# Patient Record
Sex: Female | Born: 1940 | Race: White | Hispanic: No | Marital: Single | Smoking: Former smoker
Health system: Southern US, Community
[De-identification: ages and names within clinical notes are randomized; demographics above are authoritative.]

## PROBLEM LIST (undated history)

## (undated) DIAGNOSIS — Z8673 Personal history of transient ischemic attack (TIA), and cerebral infarction without residual deficits: Secondary | ICD-10-CM

## (undated) DIAGNOSIS — N952 Postmenopausal atrophic vaginitis: Secondary | ICD-10-CM

## (undated) DIAGNOSIS — G250 Essential tremor: Secondary | ICD-10-CM

## (undated) DIAGNOSIS — E785 Hyperlipidemia, unspecified: Secondary | ICD-10-CM

## (undated) DIAGNOSIS — N189 Chronic kidney disease, unspecified: Secondary | ICD-10-CM

## (undated) DIAGNOSIS — K224 Dyskinesia of esophagus: Secondary | ICD-10-CM

## (undated) DIAGNOSIS — Z86718 Personal history of other venous thrombosis and embolism: Secondary | ICD-10-CM

## (undated) DIAGNOSIS — G459 Transient cerebral ischemic attack, unspecified: Secondary | ICD-10-CM

## (undated) DIAGNOSIS — I82409 Acute embolism and thrombosis of unspecified deep veins of unspecified lower extremity: Secondary | ICD-10-CM

## (undated) DIAGNOSIS — I639 Cerebral infarction, unspecified: Secondary | ICD-10-CM

## (undated) HISTORY — PX: ELBOW SURGERY: SHX618

## (undated) HISTORY — DX: Postmenopausal atrophic vaginitis: N95.2

## (undated) HISTORY — PX: TRIGGER FINGER RELEASE: SHX641

## (undated) HISTORY — DX: Dyskinesia of esophagus: K22.4

## (undated) HISTORY — PX: CATARACT EXTRACTION: SUR2

## (undated) HISTORY — DX: Cerebral infarction, unspecified: I63.9

## (undated) HISTORY — DX: Acute embolism and thrombosis of unspecified deep veins of unspecified lower extremity: I82.409

## (undated) HISTORY — DX: Transient cerebral ischemic attack, unspecified: G45.9

## (undated) HISTORY — DX: Chronic kidney disease, unspecified: N18.9

## (undated) HISTORY — DX: Personal history of other venous thrombosis and embolism: Z86.718

## (undated) HISTORY — DX: Hyperlipidemia, unspecified: E78.5

## (undated) HISTORY — DX: Essential tremor: G25.0

---
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
## 2011-06-08 DIAGNOSIS — M999 Biomechanical lesion, unspecified: Secondary | ICD-10-CM | POA: Diagnosis not present

## 2011-06-08 DIAGNOSIS — S139XXA Sprain of joints and ligaments of unspecified parts of neck, initial encounter: Secondary | ICD-10-CM | POA: Diagnosis not present

## 2011-06-08 DIAGNOSIS — M9981 Other biomechanical lesions of cervical region: Secondary | ICD-10-CM | POA: Diagnosis not present

## 2011-06-08 DIAGNOSIS — M5126 Other intervertebral disc displacement, lumbar region: Secondary | ICD-10-CM | POA: Diagnosis not present

## 2011-08-03 DIAGNOSIS — M999 Biomechanical lesion, unspecified: Secondary | ICD-10-CM | POA: Diagnosis not present

## 2011-08-03 DIAGNOSIS — M9981 Other biomechanical lesions of cervical region: Secondary | ICD-10-CM | POA: Diagnosis not present

## 2011-08-03 DIAGNOSIS — S139XXA Sprain of joints and ligaments of unspecified parts of neck, initial encounter: Secondary | ICD-10-CM | POA: Diagnosis not present

## 2011-08-03 DIAGNOSIS — M5126 Other intervertebral disc displacement, lumbar region: Secondary | ICD-10-CM | POA: Diagnosis not present

## 2011-09-28 DIAGNOSIS — M9981 Other biomechanical lesions of cervical region: Secondary | ICD-10-CM | POA: Diagnosis not present

## 2011-09-28 DIAGNOSIS — M999 Biomechanical lesion, unspecified: Secondary | ICD-10-CM | POA: Diagnosis not present

## 2011-09-28 DIAGNOSIS — M5126 Other intervertebral disc displacement, lumbar region: Secondary | ICD-10-CM | POA: Diagnosis not present

## 2011-09-28 DIAGNOSIS — S139XXA Sprain of joints and ligaments of unspecified parts of neck, initial encounter: Secondary | ICD-10-CM | POA: Diagnosis not present

## 2011-11-02 DIAGNOSIS — S139XXA Sprain of joints and ligaments of unspecified parts of neck, initial encounter: Secondary | ICD-10-CM | POA: Diagnosis not present

## 2011-11-02 DIAGNOSIS — M5126 Other intervertebral disc displacement, lumbar region: Secondary | ICD-10-CM | POA: Diagnosis not present

## 2011-11-02 DIAGNOSIS — M9981 Other biomechanical lesions of cervical region: Secondary | ICD-10-CM | POA: Diagnosis not present

## 2011-11-02 DIAGNOSIS — M999 Biomechanical lesion, unspecified: Secondary | ICD-10-CM | POA: Diagnosis not present

## 2011-12-21 DIAGNOSIS — M9981 Other biomechanical lesions of cervical region: Secondary | ICD-10-CM | POA: Diagnosis not present

## 2011-12-21 DIAGNOSIS — M5126 Other intervertebral disc displacement, lumbar region: Secondary | ICD-10-CM | POA: Diagnosis not present

## 2011-12-21 DIAGNOSIS — S139XXA Sprain of joints and ligaments of unspecified parts of neck, initial encounter: Secondary | ICD-10-CM | POA: Diagnosis not present

## 2011-12-21 DIAGNOSIS — M999 Biomechanical lesion, unspecified: Secondary | ICD-10-CM | POA: Diagnosis not present

## 2012-02-15 DIAGNOSIS — M999 Biomechanical lesion, unspecified: Secondary | ICD-10-CM | POA: Diagnosis not present

## 2012-02-15 DIAGNOSIS — M9981 Other biomechanical lesions of cervical region: Secondary | ICD-10-CM | POA: Diagnosis not present

## 2012-02-15 DIAGNOSIS — M5126 Other intervertebral disc displacement, lumbar region: Secondary | ICD-10-CM | POA: Diagnosis not present

## 2012-02-15 DIAGNOSIS — S139XXA Sprain of joints and ligaments of unspecified parts of neck, initial encounter: Secondary | ICD-10-CM | POA: Diagnosis not present

## 2012-04-04 DIAGNOSIS — M999 Biomechanical lesion, unspecified: Secondary | ICD-10-CM | POA: Diagnosis not present

## 2012-04-04 DIAGNOSIS — M9981 Other biomechanical lesions of cervical region: Secondary | ICD-10-CM | POA: Diagnosis not present

## 2012-04-04 DIAGNOSIS — S139XXA Sprain of joints and ligaments of unspecified parts of neck, initial encounter: Secondary | ICD-10-CM | POA: Diagnosis not present

## 2012-04-04 DIAGNOSIS — M5126 Other intervertebral disc displacement, lumbar region: Secondary | ICD-10-CM | POA: Diagnosis not present

## 2012-06-06 DIAGNOSIS — S139XXA Sprain of joints and ligaments of unspecified parts of neck, initial encounter: Secondary | ICD-10-CM | POA: Diagnosis not present

## 2012-06-06 DIAGNOSIS — M9981 Other biomechanical lesions of cervical region: Secondary | ICD-10-CM | POA: Diagnosis not present

## 2012-06-06 DIAGNOSIS — M5126 Other intervertebral disc displacement, lumbar region: Secondary | ICD-10-CM | POA: Diagnosis not present

## 2012-06-06 DIAGNOSIS — M999 Biomechanical lesion, unspecified: Secondary | ICD-10-CM | POA: Diagnosis not present

## 2012-08-01 DIAGNOSIS — M999 Biomechanical lesion, unspecified: Secondary | ICD-10-CM | POA: Diagnosis not present

## 2012-08-01 DIAGNOSIS — S139XXA Sprain of joints and ligaments of unspecified parts of neck, initial encounter: Secondary | ICD-10-CM | POA: Diagnosis not present

## 2012-08-01 DIAGNOSIS — M9981 Other biomechanical lesions of cervical region: Secondary | ICD-10-CM | POA: Diagnosis not present

## 2012-08-01 DIAGNOSIS — M5126 Other intervertebral disc displacement, lumbar region: Secondary | ICD-10-CM | POA: Diagnosis not present

## 2012-09-26 DIAGNOSIS — M5126 Other intervertebral disc displacement, lumbar region: Secondary | ICD-10-CM | POA: Diagnosis not present

## 2012-09-26 DIAGNOSIS — S139XXA Sprain of joints and ligaments of unspecified parts of neck, initial encounter: Secondary | ICD-10-CM | POA: Diagnosis not present

## 2012-09-26 DIAGNOSIS — M999 Biomechanical lesion, unspecified: Secondary | ICD-10-CM | POA: Diagnosis not present

## 2012-09-26 DIAGNOSIS — M9981 Other biomechanical lesions of cervical region: Secondary | ICD-10-CM | POA: Diagnosis not present

## 2012-10-22 DIAGNOSIS — M9981 Other biomechanical lesions of cervical region: Secondary | ICD-10-CM | POA: Diagnosis not present

## 2012-10-22 DIAGNOSIS — M999 Biomechanical lesion, unspecified: Secondary | ICD-10-CM | POA: Diagnosis not present

## 2012-10-22 DIAGNOSIS — S139XXA Sprain of joints and ligaments of unspecified parts of neck, initial encounter: Secondary | ICD-10-CM | POA: Diagnosis not present

## 2012-10-22 DIAGNOSIS — M5126 Other intervertebral disc displacement, lumbar region: Secondary | ICD-10-CM | POA: Diagnosis not present

## 2012-10-31 DIAGNOSIS — M999 Biomechanical lesion, unspecified: Secondary | ICD-10-CM | POA: Diagnosis not present

## 2012-10-31 DIAGNOSIS — S139XXA Sprain of joints and ligaments of unspecified parts of neck, initial encounter: Secondary | ICD-10-CM | POA: Diagnosis not present

## 2012-10-31 DIAGNOSIS — M5126 Other intervertebral disc displacement, lumbar region: Secondary | ICD-10-CM | POA: Diagnosis not present

## 2012-10-31 DIAGNOSIS — M9981 Other biomechanical lesions of cervical region: Secondary | ICD-10-CM | POA: Diagnosis not present

## 2012-11-05 DIAGNOSIS — M9981 Other biomechanical lesions of cervical region: Secondary | ICD-10-CM | POA: Diagnosis not present

## 2012-11-05 DIAGNOSIS — M5126 Other intervertebral disc displacement, lumbar region: Secondary | ICD-10-CM | POA: Diagnosis not present

## 2012-11-05 DIAGNOSIS — S139XXA Sprain of joints and ligaments of unspecified parts of neck, initial encounter: Secondary | ICD-10-CM | POA: Diagnosis not present

## 2012-11-05 DIAGNOSIS — M999 Biomechanical lesion, unspecified: Secondary | ICD-10-CM | POA: Diagnosis not present

## 2012-12-19 DIAGNOSIS — M9981 Other biomechanical lesions of cervical region: Secondary | ICD-10-CM | POA: Diagnosis not present

## 2012-12-19 DIAGNOSIS — M999 Biomechanical lesion, unspecified: Secondary | ICD-10-CM | POA: Diagnosis not present

## 2012-12-19 DIAGNOSIS — S139XXA Sprain of joints and ligaments of unspecified parts of neck, initial encounter: Secondary | ICD-10-CM | POA: Diagnosis not present

## 2012-12-19 DIAGNOSIS — M5126 Other intervertebral disc displacement, lumbar region: Secondary | ICD-10-CM | POA: Diagnosis not present

## 2013-02-20 DIAGNOSIS — M9981 Other biomechanical lesions of cervical region: Secondary | ICD-10-CM | POA: Diagnosis not present

## 2013-02-20 DIAGNOSIS — M5126 Other intervertebral disc displacement, lumbar region: Secondary | ICD-10-CM | POA: Diagnosis not present

## 2013-02-20 DIAGNOSIS — M999 Biomechanical lesion, unspecified: Secondary | ICD-10-CM | POA: Diagnosis not present

## 2013-02-20 DIAGNOSIS — S139XXA Sprain of joints and ligaments of unspecified parts of neck, initial encounter: Secondary | ICD-10-CM | POA: Diagnosis not present

## 2013-03-20 DIAGNOSIS — M9981 Other biomechanical lesions of cervical region: Secondary | ICD-10-CM | POA: Diagnosis not present

## 2013-03-20 DIAGNOSIS — M999 Biomechanical lesion, unspecified: Secondary | ICD-10-CM | POA: Diagnosis not present

## 2013-03-20 DIAGNOSIS — S139XXA Sprain of joints and ligaments of unspecified parts of neck, initial encounter: Secondary | ICD-10-CM | POA: Diagnosis not present

## 2013-03-20 DIAGNOSIS — M5126 Other intervertebral disc displacement, lumbar region: Secondary | ICD-10-CM | POA: Diagnosis not present

## 2013-06-20 DIAGNOSIS — Z7901 Long term (current) use of anticoagulants: Secondary | ICD-10-CM | POA: Diagnosis not present

## 2013-07-10 DIAGNOSIS — M9981 Other biomechanical lesions of cervical region: Secondary | ICD-10-CM | POA: Diagnosis not present

## 2013-07-10 DIAGNOSIS — S139XXA Sprain of joints and ligaments of unspecified parts of neck, initial encounter: Secondary | ICD-10-CM | POA: Diagnosis not present

## 2013-07-10 DIAGNOSIS — M999 Biomechanical lesion, unspecified: Secondary | ICD-10-CM | POA: Diagnosis not present

## 2013-07-10 DIAGNOSIS — M5126 Other intervertebral disc displacement, lumbar region: Secondary | ICD-10-CM | POA: Diagnosis not present

## 2013-07-25 DIAGNOSIS — Z7901 Long term (current) use of anticoagulants: Secondary | ICD-10-CM | POA: Diagnosis not present

## 2013-08-21 DIAGNOSIS — G250 Essential tremor: Secondary | ICD-10-CM | POA: Diagnosis not present

## 2013-08-21 DIAGNOSIS — E782 Mixed hyperlipidemia: Secondary | ICD-10-CM | POA: Diagnosis not present

## 2013-08-21 DIAGNOSIS — Z780 Asymptomatic menopausal state: Secondary | ICD-10-CM | POA: Diagnosis not present

## 2013-08-21 DIAGNOSIS — Z0000 Encounter for general adult medical examination without abnormal findings: Secondary | ICD-10-CM | POA: Diagnosis not present

## 2013-08-21 DIAGNOSIS — M810 Age-related osteoporosis without current pathological fracture: Secondary | ICD-10-CM | POA: Diagnosis not present

## 2013-08-21 DIAGNOSIS — Z79899 Other long term (current) drug therapy: Secondary | ICD-10-CM | POA: Diagnosis not present

## 2013-08-21 DIAGNOSIS — G252 Other specified forms of tremor: Secondary | ICD-10-CM | POA: Diagnosis not present

## 2013-08-21 DIAGNOSIS — I10 Essential (primary) hypertension: Secondary | ICD-10-CM | POA: Diagnosis not present

## 2013-08-21 DIAGNOSIS — Z86718 Personal history of other venous thrombosis and embolism: Secondary | ICD-10-CM | POA: Diagnosis not present

## 2013-08-21 DIAGNOSIS — Z7901 Long term (current) use of anticoagulants: Secondary | ICD-10-CM | POA: Diagnosis not present

## 2013-08-21 DIAGNOSIS — Z23 Encounter for immunization: Secondary | ICD-10-CM | POA: Diagnosis not present

## 2013-08-22 DIAGNOSIS — E782 Mixed hyperlipidemia: Secondary | ICD-10-CM | POA: Diagnosis not present

## 2013-08-22 DIAGNOSIS — I2510 Atherosclerotic heart disease of native coronary artery without angina pectoris: Secondary | ICD-10-CM | POA: Diagnosis not present

## 2013-09-04 DIAGNOSIS — M9981 Other biomechanical lesions of cervical region: Secondary | ICD-10-CM | POA: Diagnosis not present

## 2013-09-04 DIAGNOSIS — M999 Biomechanical lesion, unspecified: Secondary | ICD-10-CM | POA: Diagnosis not present

## 2013-09-04 DIAGNOSIS — M5126 Other intervertebral disc displacement, lumbar region: Secondary | ICD-10-CM | POA: Diagnosis not present

## 2013-09-04 DIAGNOSIS — S139XXA Sprain of joints and ligaments of unspecified parts of neck, initial encounter: Secondary | ICD-10-CM | POA: Diagnosis not present

## 2013-09-11 DIAGNOSIS — H26499 Other secondary cataract, unspecified eye: Secondary | ICD-10-CM | POA: Diagnosis not present

## 2013-09-19 DIAGNOSIS — Z23 Encounter for immunization: Secondary | ICD-10-CM | POA: Diagnosis not present

## 2013-09-19 DIAGNOSIS — Z7901 Long term (current) use of anticoagulants: Secondary | ICD-10-CM | POA: Diagnosis not present

## 2013-10-07 DIAGNOSIS — M5126 Other intervertebral disc displacement, lumbar region: Secondary | ICD-10-CM | POA: Diagnosis not present

## 2013-10-07 DIAGNOSIS — M999 Biomechanical lesion, unspecified: Secondary | ICD-10-CM | POA: Diagnosis not present

## 2013-10-07 DIAGNOSIS — M9981 Other biomechanical lesions of cervical region: Secondary | ICD-10-CM | POA: Diagnosis not present

## 2013-10-07 DIAGNOSIS — S139XXA Sprain of joints and ligaments of unspecified parts of neck, initial encounter: Secondary | ICD-10-CM | POA: Diagnosis not present

## 2013-10-20 DIAGNOSIS — Z7901 Long term (current) use of anticoagulants: Secondary | ICD-10-CM | POA: Diagnosis not present

## 2013-11-21 DIAGNOSIS — Z7901 Long term (current) use of anticoagulants: Secondary | ICD-10-CM | POA: Diagnosis not present

## 2013-11-28 DIAGNOSIS — M7710 Lateral epicondylitis, unspecified elbow: Secondary | ICD-10-CM | POA: Diagnosis not present

## 2013-11-28 DIAGNOSIS — Z7901 Long term (current) use of anticoagulants: Secondary | ICD-10-CM | POA: Diagnosis not present

## 2013-12-04 DIAGNOSIS — M999 Biomechanical lesion, unspecified: Secondary | ICD-10-CM | POA: Diagnosis not present

## 2013-12-04 DIAGNOSIS — M9981 Other biomechanical lesions of cervical region: Secondary | ICD-10-CM | POA: Diagnosis not present

## 2013-12-04 DIAGNOSIS — S139XXA Sprain of joints and ligaments of unspecified parts of neck, initial encounter: Secondary | ICD-10-CM | POA: Diagnosis not present

## 2013-12-04 DIAGNOSIS — M5126 Other intervertebral disc displacement, lumbar region: Secondary | ICD-10-CM | POA: Diagnosis not present

## 2013-12-17 DIAGNOSIS — M7710 Lateral epicondylitis, unspecified elbow: Secondary | ICD-10-CM | POA: Diagnosis not present

## 2013-12-17 DIAGNOSIS — Z7901 Long term (current) use of anticoagulants: Secondary | ICD-10-CM | POA: Diagnosis not present

## 2014-01-15 DIAGNOSIS — M7711 Lateral epicondylitis, right elbow: Secondary | ICD-10-CM | POA: Diagnosis not present

## 2014-01-15 DIAGNOSIS — Z7901 Long term (current) use of anticoagulants: Secondary | ICD-10-CM | POA: Diagnosis not present

## 2014-01-22 DIAGNOSIS — M5441 Lumbago with sciatica, right side: Secondary | ICD-10-CM | POA: Diagnosis not present

## 2014-01-22 DIAGNOSIS — M9903 Segmental and somatic dysfunction of lumbar region: Secondary | ICD-10-CM | POA: Diagnosis not present

## 2014-01-22 DIAGNOSIS — M9905 Segmental and somatic dysfunction of pelvic region: Secondary | ICD-10-CM | POA: Diagnosis not present

## 2014-01-22 DIAGNOSIS — M9901 Segmental and somatic dysfunction of cervical region: Secondary | ICD-10-CM | POA: Diagnosis not present

## 2014-01-29 DIAGNOSIS — N950 Postmenopausal bleeding: Secondary | ICD-10-CM | POA: Diagnosis not present

## 2014-02-20 DIAGNOSIS — Z7901 Long term (current) use of anticoagulants: Secondary | ICD-10-CM | POA: Diagnosis not present

## 2014-03-19 DIAGNOSIS — M9905 Segmental and somatic dysfunction of pelvic region: Secondary | ICD-10-CM | POA: Diagnosis not present

## 2014-03-19 DIAGNOSIS — M5441 Lumbago with sciatica, right side: Secondary | ICD-10-CM | POA: Diagnosis not present

## 2014-03-19 DIAGNOSIS — M9901 Segmental and somatic dysfunction of cervical region: Secondary | ICD-10-CM | POA: Diagnosis not present

## 2014-03-19 DIAGNOSIS — M9903 Segmental and somatic dysfunction of lumbar region: Secondary | ICD-10-CM | POA: Diagnosis not present

## 2014-03-20 DIAGNOSIS — Z7901 Long term (current) use of anticoagulants: Secondary | ICD-10-CM | POA: Diagnosis not present

## 2014-04-14 DIAGNOSIS — M9905 Segmental and somatic dysfunction of pelvic region: Secondary | ICD-10-CM | POA: Diagnosis not present

## 2014-04-14 DIAGNOSIS — M9903 Segmental and somatic dysfunction of lumbar region: Secondary | ICD-10-CM | POA: Diagnosis not present

## 2014-04-14 DIAGNOSIS — M5441 Lumbago with sciatica, right side: Secondary | ICD-10-CM | POA: Diagnosis not present

## 2014-04-14 DIAGNOSIS — M9901 Segmental and somatic dysfunction of cervical region: Secondary | ICD-10-CM | POA: Diagnosis not present

## 2014-04-15 DIAGNOSIS — M7711 Lateral epicondylitis, right elbow: Secondary | ICD-10-CM | POA: Diagnosis not present

## 2014-04-15 DIAGNOSIS — Z7901 Long term (current) use of anticoagulants: Secondary | ICD-10-CM | POA: Diagnosis not present

## 2014-04-15 DIAGNOSIS — N950 Postmenopausal bleeding: Secondary | ICD-10-CM | POA: Diagnosis not present

## 2014-04-27 DIAGNOSIS — M25521 Pain in right elbow: Secondary | ICD-10-CM | POA: Diagnosis not present

## 2014-04-30 DIAGNOSIS — M25521 Pain in right elbow: Secondary | ICD-10-CM | POA: Diagnosis not present

## 2014-05-04 DIAGNOSIS — M25521 Pain in right elbow: Secondary | ICD-10-CM | POA: Diagnosis not present

## 2014-05-07 DIAGNOSIS — M25521 Pain in right elbow: Secondary | ICD-10-CM | POA: Diagnosis not present

## 2014-05-11 DIAGNOSIS — M25521 Pain in right elbow: Secondary | ICD-10-CM | POA: Diagnosis not present

## 2014-05-14 DIAGNOSIS — M25521 Pain in right elbow: Secondary | ICD-10-CM | POA: Diagnosis not present

## 2014-05-20 DIAGNOSIS — M25521 Pain in right elbow: Secondary | ICD-10-CM | POA: Diagnosis not present

## 2014-05-22 DIAGNOSIS — Z7901 Long term (current) use of anticoagulants: Secondary | ICD-10-CM | POA: Diagnosis not present

## 2014-05-28 DIAGNOSIS — M25521 Pain in right elbow: Secondary | ICD-10-CM | POA: Diagnosis not present

## 2014-06-03 DIAGNOSIS — M25521 Pain in right elbow: Secondary | ICD-10-CM | POA: Diagnosis not present

## 2014-06-09 DIAGNOSIS — M9905 Segmental and somatic dysfunction of pelvic region: Secondary | ICD-10-CM | POA: Diagnosis not present

## 2014-06-09 DIAGNOSIS — M9903 Segmental and somatic dysfunction of lumbar region: Secondary | ICD-10-CM | POA: Diagnosis not present

## 2014-06-09 DIAGNOSIS — M5441 Lumbago with sciatica, right side: Secondary | ICD-10-CM | POA: Diagnosis not present

## 2014-06-09 DIAGNOSIS — M9901 Segmental and somatic dysfunction of cervical region: Secondary | ICD-10-CM | POA: Diagnosis not present

## 2014-06-10 DIAGNOSIS — M25521 Pain in right elbow: Secondary | ICD-10-CM | POA: Diagnosis not present

## 2014-06-18 DIAGNOSIS — M25521 Pain in right elbow: Secondary | ICD-10-CM | POA: Diagnosis not present

## 2014-08-06 DIAGNOSIS — M5441 Lumbago with sciatica, right side: Secondary | ICD-10-CM | POA: Diagnosis not present

## 2014-08-06 DIAGNOSIS — M9901 Segmental and somatic dysfunction of cervical region: Secondary | ICD-10-CM | POA: Diagnosis not present

## 2014-08-06 DIAGNOSIS — M9903 Segmental and somatic dysfunction of lumbar region: Secondary | ICD-10-CM | POA: Diagnosis not present

## 2014-08-06 DIAGNOSIS — M9905 Segmental and somatic dysfunction of pelvic region: Secondary | ICD-10-CM | POA: Diagnosis not present

## 2014-08-13 DIAGNOSIS — M9903 Segmental and somatic dysfunction of lumbar region: Secondary | ICD-10-CM | POA: Diagnosis not present

## 2014-08-13 DIAGNOSIS — M5441 Lumbago with sciatica, right side: Secondary | ICD-10-CM | POA: Diagnosis not present

## 2014-08-13 DIAGNOSIS — M9901 Segmental and somatic dysfunction of cervical region: Secondary | ICD-10-CM | POA: Diagnosis not present

## 2014-08-13 DIAGNOSIS — M9905 Segmental and somatic dysfunction of pelvic region: Secondary | ICD-10-CM | POA: Diagnosis not present

## 2014-09-15 DIAGNOSIS — M9901 Segmental and somatic dysfunction of cervical region: Secondary | ICD-10-CM | POA: Diagnosis not present

## 2014-09-15 DIAGNOSIS — M5441 Lumbago with sciatica, right side: Secondary | ICD-10-CM | POA: Diagnosis not present

## 2014-09-15 DIAGNOSIS — M9905 Segmental and somatic dysfunction of pelvic region: Secondary | ICD-10-CM | POA: Diagnosis not present

## 2014-09-15 DIAGNOSIS — M9903 Segmental and somatic dysfunction of lumbar region: Secondary | ICD-10-CM | POA: Diagnosis not present

## 2014-09-17 DIAGNOSIS — H26493 Other secondary cataract, bilateral: Secondary | ICD-10-CM | POA: Diagnosis not present

## 2014-09-24 DIAGNOSIS — M5441 Lumbago with sciatica, right side: Secondary | ICD-10-CM | POA: Diagnosis not present

## 2014-09-24 DIAGNOSIS — M9905 Segmental and somatic dysfunction of pelvic region: Secondary | ICD-10-CM | POA: Diagnosis not present

## 2014-09-24 DIAGNOSIS — M9901 Segmental and somatic dysfunction of cervical region: Secondary | ICD-10-CM | POA: Diagnosis not present

## 2014-09-24 DIAGNOSIS — M9903 Segmental and somatic dysfunction of lumbar region: Secondary | ICD-10-CM | POA: Diagnosis not present

## 2014-10-06 DIAGNOSIS — M9901 Segmental and somatic dysfunction of cervical region: Secondary | ICD-10-CM | POA: Diagnosis not present

## 2014-10-06 DIAGNOSIS — M5441 Lumbago with sciatica, right side: Secondary | ICD-10-CM | POA: Diagnosis not present

## 2014-10-06 DIAGNOSIS — M9905 Segmental and somatic dysfunction of pelvic region: Secondary | ICD-10-CM | POA: Diagnosis not present

## 2014-10-06 DIAGNOSIS — M9903 Segmental and somatic dysfunction of lumbar region: Secondary | ICD-10-CM | POA: Diagnosis not present

## 2014-11-04 DIAGNOSIS — H1033 Unspecified acute conjunctivitis, bilateral: Secondary | ICD-10-CM | POA: Diagnosis not present

## 2014-11-04 DIAGNOSIS — J0100 Acute maxillary sinusitis, unspecified: Secondary | ICD-10-CM | POA: Diagnosis not present

## 2014-11-04 DIAGNOSIS — J209 Acute bronchitis, unspecified: Secondary | ICD-10-CM | POA: Diagnosis not present

## 2014-11-10 DIAGNOSIS — J209 Acute bronchitis, unspecified: Secondary | ICD-10-CM | POA: Diagnosis not present

## 2014-11-13 DIAGNOSIS — K449 Diaphragmatic hernia without obstruction or gangrene: Secondary | ICD-10-CM | POA: Diagnosis not present

## 2014-11-13 DIAGNOSIS — R748 Abnormal levels of other serum enzymes: Secondary | ICD-10-CM | POA: Diagnosis not present

## 2014-11-13 DIAGNOSIS — J984 Other disorders of lung: Secondary | ICD-10-CM | POA: Diagnosis not present

## 2014-11-13 DIAGNOSIS — R42 Dizziness and giddiness: Secondary | ICD-10-CM | POA: Diagnosis not present

## 2014-11-13 DIAGNOSIS — H539 Unspecified visual disturbance: Secondary | ICD-10-CM | POA: Diagnosis not present

## 2014-11-13 DIAGNOSIS — H7092 Unspecified mastoiditis, left ear: Secondary | ICD-10-CM | POA: Diagnosis not present

## 2014-11-13 DIAGNOSIS — I639 Cerebral infarction, unspecified: Secondary | ICD-10-CM | POA: Diagnosis not present

## 2014-11-13 DIAGNOSIS — J40 Bronchitis, not specified as acute or chronic: Secondary | ICD-10-CM | POA: Diagnosis not present

## 2014-11-14 DIAGNOSIS — N289 Disorder of kidney and ureter, unspecified: Secondary | ICD-10-CM | POA: Diagnosis present

## 2014-11-14 DIAGNOSIS — Z8673 Personal history of transient ischemic attack (TIA), and cerebral infarction without residual deficits: Secondary | ICD-10-CM | POA: Insufficient documentation

## 2014-11-14 DIAGNOSIS — I6340 Cerebral infarction due to embolism of unspecified cerebral artery: Secondary | ICD-10-CM | POA: Diagnosis present

## 2014-11-14 DIAGNOSIS — G464 Cerebellar stroke syndrome: Secondary | ICD-10-CM | POA: Diagnosis not present

## 2014-11-14 DIAGNOSIS — H6692 Otitis media, unspecified, left ear: Secondary | ICD-10-CM | POA: Diagnosis present

## 2014-11-14 DIAGNOSIS — R42 Dizziness and giddiness: Secondary | ICD-10-CM | POA: Diagnosis present

## 2014-11-14 DIAGNOSIS — I10 Essential (primary) hypertension: Secondary | ICD-10-CM | POA: Diagnosis present

## 2014-11-14 DIAGNOSIS — Z87891 Personal history of nicotine dependence: Secondary | ICD-10-CM | POA: Diagnosis not present

## 2014-11-14 DIAGNOSIS — I6522 Occlusion and stenosis of left carotid artery: Secondary | ICD-10-CM | POA: Diagnosis not present

## 2014-11-14 DIAGNOSIS — E785 Hyperlipidemia, unspecified: Secondary | ICD-10-CM | POA: Diagnosis not present

## 2014-11-14 DIAGNOSIS — R748 Abnormal levels of other serum enzymes: Secondary | ICD-10-CM | POA: Diagnosis not present

## 2014-11-14 DIAGNOSIS — G250 Essential tremor: Secondary | ICD-10-CM | POA: Diagnosis present

## 2014-11-14 DIAGNOSIS — Z86718 Personal history of other venous thrombosis and embolism: Secondary | ICD-10-CM | POA: Diagnosis not present

## 2014-11-14 DIAGNOSIS — Z7901 Long term (current) use of anticoagulants: Secondary | ICD-10-CM | POA: Diagnosis not present

## 2014-11-14 DIAGNOSIS — I639 Cerebral infarction, unspecified: Secondary | ICD-10-CM | POA: Diagnosis not present

## 2014-11-14 DIAGNOSIS — R269 Unspecified abnormalities of gait and mobility: Secondary | ICD-10-CM | POA: Diagnosis present

## 2014-11-14 DIAGNOSIS — H539 Unspecified visual disturbance: Secondary | ICD-10-CM | POA: Diagnosis not present

## 2014-11-14 DIAGNOSIS — I340 Nonrheumatic mitral (valve) insufficiency: Secondary | ICD-10-CM | POA: Diagnosis not present

## 2014-11-14 DIAGNOSIS — M47812 Spondylosis without myelopathy or radiculopathy, cervical region: Secondary | ICD-10-CM | POA: Diagnosis not present

## 2014-11-14 HISTORY — DX: Personal history of transient ischemic attack (TIA), and cerebral infarction without residual deficits: Z86.73

## 2014-11-19 DIAGNOSIS — I639 Cerebral infarction, unspecified: Secondary | ICD-10-CM | POA: Diagnosis not present

## 2014-11-19 DIAGNOSIS — E782 Mixed hyperlipidemia: Secondary | ICD-10-CM | POA: Diagnosis not present

## 2014-11-19 DIAGNOSIS — I10 Essential (primary) hypertension: Secondary | ICD-10-CM | POA: Diagnosis not present

## 2014-11-30 DIAGNOSIS — R2689 Other abnormalities of gait and mobility: Secondary | ICD-10-CM | POA: Diagnosis not present

## 2014-12-07 DIAGNOSIS — R2689 Other abnormalities of gait and mobility: Secondary | ICD-10-CM | POA: Diagnosis not present

## 2014-12-10 DIAGNOSIS — R2689 Other abnormalities of gait and mobility: Secondary | ICD-10-CM | POA: Diagnosis not present

## 2014-12-15 DIAGNOSIS — H53453 Other localized visual field defect, bilateral: Secondary | ICD-10-CM | POA: Diagnosis not present

## 2014-12-17 DIAGNOSIS — R2689 Other abnormalities of gait and mobility: Secondary | ICD-10-CM | POA: Diagnosis not present

## 2014-12-23 DIAGNOSIS — R2689 Other abnormalities of gait and mobility: Secondary | ICD-10-CM | POA: Diagnosis not present

## 2014-12-25 DIAGNOSIS — R2689 Other abnormalities of gait and mobility: Secondary | ICD-10-CM | POA: Diagnosis not present

## 2014-12-25 DIAGNOSIS — G459 Transient cerebral ischemic attack, unspecified: Secondary | ICD-10-CM | POA: Diagnosis not present

## 2014-12-25 DIAGNOSIS — R4781 Slurred speech: Secondary | ICD-10-CM | POA: Diagnosis not present

## 2014-12-25 DIAGNOSIS — R200 Anesthesia of skin: Secondary | ICD-10-CM | POA: Diagnosis not present

## 2014-12-25 DIAGNOSIS — R4701 Aphasia: Secondary | ICD-10-CM | POA: Diagnosis not present

## 2014-12-30 DIAGNOSIS — G459 Transient cerebral ischemic attack, unspecified: Secondary | ICD-10-CM | POA: Diagnosis not present

## 2014-12-30 DIAGNOSIS — Z23 Encounter for immunization: Secondary | ICD-10-CM | POA: Diagnosis not present

## 2014-12-30 DIAGNOSIS — G250 Essential tremor: Secondary | ICD-10-CM | POA: Diagnosis not present

## 2014-12-30 DIAGNOSIS — E782 Mixed hyperlipidemia: Secondary | ICD-10-CM | POA: Diagnosis not present

## 2014-12-30 DIAGNOSIS — I10 Essential (primary) hypertension: Secondary | ICD-10-CM | POA: Diagnosis not present

## 2015-01-07 DIAGNOSIS — M5441 Lumbago with sciatica, right side: Secondary | ICD-10-CM | POA: Diagnosis not present

## 2015-01-07 DIAGNOSIS — M9903 Segmental and somatic dysfunction of lumbar region: Secondary | ICD-10-CM | POA: Diagnosis not present

## 2015-01-07 DIAGNOSIS — R2689 Other abnormalities of gait and mobility: Secondary | ICD-10-CM | POA: Diagnosis not present

## 2015-01-07 DIAGNOSIS — M9901 Segmental and somatic dysfunction of cervical region: Secondary | ICD-10-CM | POA: Diagnosis not present

## 2015-01-07 DIAGNOSIS — M9905 Segmental and somatic dysfunction of pelvic region: Secondary | ICD-10-CM | POA: Diagnosis not present

## 2015-01-11 DIAGNOSIS — Z7901 Long term (current) use of anticoagulants: Secondary | ICD-10-CM | POA: Diagnosis not present

## 2015-01-15 DIAGNOSIS — R2689 Other abnormalities of gait and mobility: Secondary | ICD-10-CM | POA: Diagnosis not present

## 2015-01-22 DIAGNOSIS — Z7901 Long term (current) use of anticoagulants: Secondary | ICD-10-CM | POA: Diagnosis not present

## 2015-01-29 DIAGNOSIS — Z7901 Long term (current) use of anticoagulants: Secondary | ICD-10-CM | POA: Diagnosis not present

## 2015-02-05 DIAGNOSIS — R2689 Other abnormalities of gait and mobility: Secondary | ICD-10-CM | POA: Diagnosis not present

## 2015-02-05 DIAGNOSIS — Z7901 Long term (current) use of anticoagulants: Secondary | ICD-10-CM | POA: Diagnosis not present

## 2015-02-12 DIAGNOSIS — Z7901 Long term (current) use of anticoagulants: Secondary | ICD-10-CM | POA: Diagnosis not present

## 2015-03-15 DIAGNOSIS — Z7901 Long term (current) use of anticoagulants: Secondary | ICD-10-CM | POA: Diagnosis not present

## 2015-03-25 DIAGNOSIS — H53001 Unspecified amblyopia, right eye: Secondary | ICD-10-CM | POA: Diagnosis not present

## 2015-03-25 DIAGNOSIS — H26493 Other secondary cataract, bilateral: Secondary | ICD-10-CM | POA: Diagnosis not present

## 2015-03-25 DIAGNOSIS — H53469 Homonymous bilateral field defects, unspecified side: Secondary | ICD-10-CM | POA: Diagnosis not present

## 2015-03-26 DIAGNOSIS — R2689 Other abnormalities of gait and mobility: Secondary | ICD-10-CM | POA: Diagnosis not present

## 2015-04-14 DIAGNOSIS — Z7901 Long term (current) use of anticoagulants: Secondary | ICD-10-CM | POA: Diagnosis not present

## 2015-04-23 DIAGNOSIS — R2689 Other abnormalities of gait and mobility: Secondary | ICD-10-CM | POA: Diagnosis not present

## 2015-05-14 DIAGNOSIS — I10 Essential (primary) hypertension: Secondary | ICD-10-CM | POA: Diagnosis not present

## 2015-05-14 DIAGNOSIS — Z0000 Encounter for general adult medical examination without abnormal findings: Secondary | ICD-10-CM | POA: Diagnosis not present

## 2015-05-14 DIAGNOSIS — Z7901 Long term (current) use of anticoagulants: Secondary | ICD-10-CM | POA: Diagnosis not present

## 2015-05-14 DIAGNOSIS — N952 Postmenopausal atrophic vaginitis: Secondary | ICD-10-CM | POA: Diagnosis not present

## 2015-05-14 DIAGNOSIS — E782 Mixed hyperlipidemia: Secondary | ICD-10-CM | POA: Diagnosis not present

## 2015-05-14 DIAGNOSIS — L570 Actinic keratosis: Secondary | ICD-10-CM | POA: Diagnosis not present

## 2015-05-21 DIAGNOSIS — H532 Diplopia: Secondary | ICD-10-CM | POA: Diagnosis not present

## 2015-06-10 DIAGNOSIS — H26493 Other secondary cataract, bilateral: Secondary | ICD-10-CM | POA: Diagnosis not present

## 2015-06-15 DIAGNOSIS — Z7901 Long term (current) use of anticoagulants: Secondary | ICD-10-CM | POA: Diagnosis not present

## 2015-06-17 DIAGNOSIS — H26491 Other secondary cataract, right eye: Secondary | ICD-10-CM | POA: Diagnosis not present

## 2015-07-15 DIAGNOSIS — Z7901 Long term (current) use of anticoagulants: Secondary | ICD-10-CM | POA: Diagnosis not present

## 2015-08-12 DIAGNOSIS — Z7901 Long term (current) use of anticoagulants: Secondary | ICD-10-CM | POA: Diagnosis not present

## 2015-09-24 DIAGNOSIS — I10 Essential (primary) hypertension: Secondary | ICD-10-CM | POA: Diagnosis not present

## 2015-09-24 DIAGNOSIS — E782 Mixed hyperlipidemia: Secondary | ICD-10-CM | POA: Diagnosis not present

## 2015-09-24 DIAGNOSIS — Z7901 Long term (current) use of anticoagulants: Secondary | ICD-10-CM | POA: Diagnosis not present

## 2015-09-24 DIAGNOSIS — Z79899 Other long term (current) drug therapy: Secondary | ICD-10-CM | POA: Diagnosis not present

## 2015-10-26 DIAGNOSIS — Z7901 Long term (current) use of anticoagulants: Secondary | ICD-10-CM | POA: Diagnosis not present

## 2015-11-09 DIAGNOSIS — H1033 Unspecified acute conjunctivitis, bilateral: Secondary | ICD-10-CM | POA: Diagnosis not present

## 2015-11-09 DIAGNOSIS — J069 Acute upper respiratory infection, unspecified: Secondary | ICD-10-CM | POA: Diagnosis not present

## 2015-11-19 DIAGNOSIS — Z7901 Long term (current) use of anticoagulants: Secondary | ICD-10-CM | POA: Diagnosis not present

## 2015-12-21 DIAGNOSIS — Z7901 Long term (current) use of anticoagulants: Secondary | ICD-10-CM | POA: Diagnosis not present

## 2016-01-03 DIAGNOSIS — H53002 Unspecified amblyopia, left eye: Secondary | ICD-10-CM | POA: Diagnosis not present

## 2016-01-03 DIAGNOSIS — H26493 Other secondary cataract, bilateral: Secondary | ICD-10-CM | POA: Diagnosis not present

## 2016-01-10 DIAGNOSIS — H53469 Homonymous bilateral field defects, unspecified side: Secondary | ICD-10-CM | POA: Diagnosis not present

## 2016-01-20 DIAGNOSIS — Z23 Encounter for immunization: Secondary | ICD-10-CM | POA: Diagnosis not present

## 2016-01-20 DIAGNOSIS — Z7901 Long term (current) use of anticoagulants: Secondary | ICD-10-CM | POA: Diagnosis not present

## 2016-02-18 DIAGNOSIS — K649 Unspecified hemorrhoids: Secondary | ICD-10-CM | POA: Diagnosis not present

## 2016-02-18 DIAGNOSIS — E559 Vitamin D deficiency, unspecified: Secondary | ICD-10-CM | POA: Diagnosis not present

## 2016-02-18 DIAGNOSIS — E782 Mixed hyperlipidemia: Secondary | ICD-10-CM | POA: Diagnosis not present

## 2016-02-18 DIAGNOSIS — Z7901 Long term (current) use of anticoagulants: Secondary | ICD-10-CM | POA: Diagnosis not present

## 2016-02-18 DIAGNOSIS — Z79899 Other long term (current) drug therapy: Secondary | ICD-10-CM | POA: Diagnosis not present

## 2016-02-18 DIAGNOSIS — N952 Postmenopausal atrophic vaginitis: Secondary | ICD-10-CM | POA: Diagnosis not present

## 2016-03-21 DIAGNOSIS — L570 Actinic keratosis: Secondary | ICD-10-CM | POA: Diagnosis not present

## 2016-04-19 DIAGNOSIS — Z7901 Long term (current) use of anticoagulants: Secondary | ICD-10-CM | POA: Diagnosis not present

## 2016-05-05 DIAGNOSIS — Z7901 Long term (current) use of anticoagulants: Secondary | ICD-10-CM | POA: Diagnosis not present

## 2016-05-17 DIAGNOSIS — M25561 Pain in right knee: Secondary | ICD-10-CM | POA: Diagnosis not present

## 2016-06-01 DIAGNOSIS — Z7901 Long term (current) use of anticoagulants: Secondary | ICD-10-CM | POA: Diagnosis not present

## 2016-06-01 DIAGNOSIS — S86911S Strain of unspecified muscle(s) and tendon(s) at lower leg level, right leg, sequela: Secondary | ICD-10-CM | POA: Diagnosis not present

## 2016-06-02 DIAGNOSIS — Z7901 Long term (current) use of anticoagulants: Secondary | ICD-10-CM | POA: Diagnosis not present

## 2016-06-09 DIAGNOSIS — Z7901 Long term (current) use of anticoagulants: Secondary | ICD-10-CM | POA: Diagnosis not present

## 2016-06-23 DIAGNOSIS — Z7901 Long term (current) use of anticoagulants: Secondary | ICD-10-CM | POA: Diagnosis not present

## 2016-07-06 DIAGNOSIS — H53469 Homonymous bilateral field defects, unspecified side: Secondary | ICD-10-CM | POA: Diagnosis not present

## 2016-07-06 DIAGNOSIS — H26493 Other secondary cataract, bilateral: Secondary | ICD-10-CM | POA: Diagnosis not present

## 2016-07-07 DIAGNOSIS — Z7901 Long term (current) use of anticoagulants: Secondary | ICD-10-CM | POA: Diagnosis not present

## 2016-07-19 DIAGNOSIS — Z6825 Body mass index (BMI) 25.0-25.9, adult: Secondary | ICD-10-CM | POA: Diagnosis not present

## 2016-07-19 DIAGNOSIS — Z7901 Long term (current) use of anticoagulants: Secondary | ICD-10-CM | POA: Diagnosis not present

## 2016-07-19 DIAGNOSIS — I10 Essential (primary) hypertension: Secondary | ICD-10-CM | POA: Diagnosis not present

## 2016-07-19 DIAGNOSIS — M25561 Pain in right knee: Secondary | ICD-10-CM | POA: Diagnosis not present

## 2016-07-28 DIAGNOSIS — Z7901 Long term (current) use of anticoagulants: Secondary | ICD-10-CM | POA: Diagnosis not present

## 2016-08-29 DIAGNOSIS — Z7901 Long term (current) use of anticoagulants: Secondary | ICD-10-CM | POA: Diagnosis not present

## 2016-09-27 DIAGNOSIS — Z7901 Long term (current) use of anticoagulants: Secondary | ICD-10-CM | POA: Diagnosis not present

## 2016-10-30 DIAGNOSIS — Z7901 Long term (current) use of anticoagulants: Secondary | ICD-10-CM | POA: Diagnosis not present

## 2016-11-06 ENCOUNTER — Other Ambulatory Visit: Payer: Self-pay

## 2016-11-28 DIAGNOSIS — Z7901 Long term (current) use of anticoagulants: Secondary | ICD-10-CM | POA: Diagnosis not present

## 2017-01-01 DIAGNOSIS — Z7901 Long term (current) use of anticoagulants: Secondary | ICD-10-CM | POA: Diagnosis not present

## 2017-01-29 DIAGNOSIS — G250 Essential tremor: Secondary | ICD-10-CM | POA: Diagnosis not present

## 2017-01-29 DIAGNOSIS — Z23 Encounter for immunization: Secondary | ICD-10-CM | POA: Diagnosis not present

## 2017-01-29 DIAGNOSIS — Z7901 Long term (current) use of anticoagulants: Secondary | ICD-10-CM | POA: Diagnosis not present

## 2017-01-29 DIAGNOSIS — E782 Mixed hyperlipidemia: Secondary | ICD-10-CM | POA: Diagnosis not present

## 2017-01-29 DIAGNOSIS — Z79899 Other long term (current) drug therapy: Secondary | ICD-10-CM | POA: Diagnosis not present

## 2017-01-29 DIAGNOSIS — Z8673 Personal history of transient ischemic attack (TIA), and cerebral infarction without residual deficits: Secondary | ICD-10-CM | POA: Diagnosis not present

## 2017-03-05 DIAGNOSIS — I10 Essential (primary) hypertension: Secondary | ICD-10-CM | POA: Diagnosis not present

## 2017-03-05 DIAGNOSIS — G459 Transient cerebral ischemic attack, unspecified: Secondary | ICD-10-CM | POA: Diagnosis not present

## 2017-03-05 DIAGNOSIS — N3090 Cystitis, unspecified without hematuria: Secondary | ICD-10-CM | POA: Diagnosis not present

## 2017-03-05 DIAGNOSIS — E782 Mixed hyperlipidemia: Secondary | ICD-10-CM | POA: Diagnosis not present

## 2017-03-05 DIAGNOSIS — Z7901 Long term (current) use of anticoagulants: Secondary | ICD-10-CM | POA: Diagnosis not present

## 2017-03-05 DIAGNOSIS — Z79899 Other long term (current) drug therapy: Secondary | ICD-10-CM | POA: Diagnosis not present

## 2017-03-05 DIAGNOSIS — Z8673 Personal history of transient ischemic attack (TIA), and cerebral infarction without residual deficits: Secondary | ICD-10-CM | POA: Diagnosis not present

## 2017-03-16 DIAGNOSIS — N3090 Cystitis, unspecified without hematuria: Secondary | ICD-10-CM | POA: Diagnosis not present

## 2017-03-16 DIAGNOSIS — R300 Dysuria: Secondary | ICD-10-CM | POA: Diagnosis not present

## 2017-03-29 DIAGNOSIS — N3000 Acute cystitis without hematuria: Secondary | ICD-10-CM | POA: Diagnosis not present

## 2017-03-29 DIAGNOSIS — N3091 Cystitis, unspecified with hematuria: Secondary | ICD-10-CM | POA: Diagnosis not present

## 2017-04-04 DIAGNOSIS — Z7901 Long term (current) use of anticoagulants: Secondary | ICD-10-CM | POA: Diagnosis not present

## 2017-05-07 DIAGNOSIS — Z7901 Long term (current) use of anticoagulants: Secondary | ICD-10-CM | POA: Diagnosis not present

## 2017-07-02 DIAGNOSIS — Z7901 Long term (current) use of anticoagulants: Secondary | ICD-10-CM | POA: Diagnosis not present

## 2017-07-12 DIAGNOSIS — H35413 Lattice degeneration of retina, bilateral: Secondary | ICD-10-CM | POA: Diagnosis not present

## 2017-07-12 DIAGNOSIS — H35373 Puckering of macula, bilateral: Secondary | ICD-10-CM | POA: Diagnosis not present

## 2017-07-12 DIAGNOSIS — H26493 Other secondary cataract, bilateral: Secondary | ICD-10-CM | POA: Diagnosis not present

## 2017-08-06 DIAGNOSIS — Z7901 Long term (current) use of anticoagulants: Secondary | ICD-10-CM | POA: Diagnosis not present

## 2017-09-18 DIAGNOSIS — Z7901 Long term (current) use of anticoagulants: Secondary | ICD-10-CM | POA: Diagnosis not present

## 2017-10-17 DIAGNOSIS — Z7901 Long term (current) use of anticoagulants: Secondary | ICD-10-CM | POA: Diagnosis not present

## 2017-11-19 DIAGNOSIS — Z7901 Long term (current) use of anticoagulants: Secondary | ICD-10-CM | POA: Diagnosis not present

## 2017-12-21 DIAGNOSIS — Z7901 Long term (current) use of anticoagulants: Secondary | ICD-10-CM | POA: Diagnosis not present

## 2018-01-31 DIAGNOSIS — E782 Mixed hyperlipidemia: Secondary | ICD-10-CM | POA: Diagnosis not present

## 2018-01-31 DIAGNOSIS — Z0000 Encounter for general adult medical examination without abnormal findings: Secondary | ICD-10-CM | POA: Diagnosis not present

## 2018-01-31 DIAGNOSIS — Z23 Encounter for immunization: Secondary | ICD-10-CM | POA: Diagnosis not present

## 2018-01-31 DIAGNOSIS — M25561 Pain in right knee: Secondary | ICD-10-CM | POA: Diagnosis not present

## 2018-01-31 DIAGNOSIS — N952 Postmenopausal atrophic vaginitis: Secondary | ICD-10-CM | POA: Diagnosis not present

## 2018-01-31 DIAGNOSIS — G250 Essential tremor: Secondary | ICD-10-CM | POA: Diagnosis not present

## 2018-01-31 DIAGNOSIS — Z79899 Other long term (current) drug therapy: Secondary | ICD-10-CM | POA: Diagnosis not present

## 2018-02-19 DIAGNOSIS — M25561 Pain in right knee: Secondary | ICD-10-CM | POA: Diagnosis not present

## 2018-02-21 DIAGNOSIS — M7711 Lateral epicondylitis, right elbow: Secondary | ICD-10-CM | POA: Diagnosis not present

## 2018-02-22 DIAGNOSIS — M25561 Pain in right knee: Secondary | ICD-10-CM | POA: Diagnosis not present

## 2018-02-25 DIAGNOSIS — M25561 Pain in right knee: Secondary | ICD-10-CM | POA: Diagnosis not present

## 2018-03-01 DIAGNOSIS — M25561 Pain in right knee: Secondary | ICD-10-CM | POA: Diagnosis not present

## 2018-03-04 DIAGNOSIS — M25561 Pain in right knee: Secondary | ICD-10-CM | POA: Diagnosis not present

## 2018-03-04 DIAGNOSIS — Z7901 Long term (current) use of anticoagulants: Secondary | ICD-10-CM | POA: Diagnosis not present

## 2018-03-08 DIAGNOSIS — M25561 Pain in right knee: Secondary | ICD-10-CM | POA: Diagnosis not present

## 2018-03-11 DIAGNOSIS — M25561 Pain in right knee: Secondary | ICD-10-CM | POA: Diagnosis not present

## 2018-03-18 DIAGNOSIS — M25561 Pain in right knee: Secondary | ICD-10-CM | POA: Diagnosis not present

## 2018-03-18 DIAGNOSIS — Z7901 Long term (current) use of anticoagulants: Secondary | ICD-10-CM | POA: Diagnosis not present

## 2018-03-22 DIAGNOSIS — M25561 Pain in right knee: Secondary | ICD-10-CM | POA: Diagnosis not present

## 2018-03-28 DIAGNOSIS — M25521 Pain in right elbow: Secondary | ICD-10-CM | POA: Diagnosis not present

## 2018-04-01 DIAGNOSIS — Z7901 Long term (current) use of anticoagulants: Secondary | ICD-10-CM | POA: Diagnosis not present

## 2018-04-01 DIAGNOSIS — M25521 Pain in right elbow: Secondary | ICD-10-CM | POA: Diagnosis not present

## 2018-04-03 DIAGNOSIS — M25521 Pain in right elbow: Secondary | ICD-10-CM | POA: Diagnosis not present

## 2018-04-05 DIAGNOSIS — Z6827 Body mass index (BMI) 27.0-27.9, adult: Secondary | ICD-10-CM | POA: Diagnosis not present

## 2018-04-05 DIAGNOSIS — G250 Essential tremor: Secondary | ICD-10-CM | POA: Diagnosis not present

## 2018-04-08 DIAGNOSIS — M25521 Pain in right elbow: Secondary | ICD-10-CM | POA: Diagnosis not present

## 2018-04-11 DIAGNOSIS — M25521 Pain in right elbow: Secondary | ICD-10-CM | POA: Diagnosis not present

## 2018-04-15 DIAGNOSIS — Z7901 Long term (current) use of anticoagulants: Secondary | ICD-10-CM | POA: Diagnosis not present

## 2018-04-16 DIAGNOSIS — M25521 Pain in right elbow: Secondary | ICD-10-CM | POA: Diagnosis not present

## 2018-04-18 DIAGNOSIS — M25521 Pain in right elbow: Secondary | ICD-10-CM | POA: Diagnosis not present

## 2018-04-22 DIAGNOSIS — M25521 Pain in right elbow: Secondary | ICD-10-CM | POA: Diagnosis not present

## 2018-04-25 DIAGNOSIS — M25521 Pain in right elbow: Secondary | ICD-10-CM | POA: Diagnosis not present

## 2018-04-29 DIAGNOSIS — M25521 Pain in right elbow: Secondary | ICD-10-CM | POA: Diagnosis not present

## 2018-05-02 DIAGNOSIS — M25521 Pain in right elbow: Secondary | ICD-10-CM | POA: Diagnosis not present

## 2018-05-06 DIAGNOSIS — M25521 Pain in right elbow: Secondary | ICD-10-CM | POA: Diagnosis not present

## 2018-06-17 DIAGNOSIS — Z7901 Long term (current) use of anticoagulants: Secondary | ICD-10-CM | POA: Diagnosis not present

## 2018-07-22 DIAGNOSIS — Z7901 Long term (current) use of anticoagulants: Secondary | ICD-10-CM | POA: Diagnosis not present

## 2018-08-26 DIAGNOSIS — Z7901 Long term (current) use of anticoagulants: Secondary | ICD-10-CM | POA: Diagnosis not present

## 2018-09-30 DIAGNOSIS — Z7901 Long term (current) use of anticoagulants: Secondary | ICD-10-CM | POA: Diagnosis not present

## 2018-10-28 DIAGNOSIS — Z7901 Long term (current) use of anticoagulants: Secondary | ICD-10-CM | POA: Diagnosis not present

## 2018-11-27 DIAGNOSIS — Z7901 Long term (current) use of anticoagulants: Secondary | ICD-10-CM | POA: Diagnosis not present

## 2018-12-30 DIAGNOSIS — Z7901 Long term (current) use of anticoagulants: Secondary | ICD-10-CM | POA: Diagnosis not present

## 2019-01-10 DIAGNOSIS — H35413 Lattice degeneration of retina, bilateral: Secondary | ICD-10-CM | POA: Diagnosis not present

## 2019-02-04 DIAGNOSIS — M810 Age-related osteoporosis without current pathological fracture: Secondary | ICD-10-CM | POA: Diagnosis not present

## 2019-02-04 DIAGNOSIS — Z79899 Other long term (current) drug therapy: Secondary | ICD-10-CM | POA: Diagnosis not present

## 2019-02-04 DIAGNOSIS — Z1331 Encounter for screening for depression: Secondary | ICD-10-CM | POA: Diagnosis not present

## 2019-02-04 DIAGNOSIS — Z7901 Long term (current) use of anticoagulants: Secondary | ICD-10-CM | POA: Diagnosis not present

## 2019-02-04 DIAGNOSIS — G250 Essential tremor: Secondary | ICD-10-CM | POA: Diagnosis not present

## 2019-02-04 DIAGNOSIS — Z23 Encounter for immunization: Secondary | ICD-10-CM | POA: Diagnosis not present

## 2019-02-04 DIAGNOSIS — Z0000 Encounter for general adult medical examination without abnormal findings: Secondary | ICD-10-CM | POA: Diagnosis not present

## 2019-02-04 DIAGNOSIS — E782 Mixed hyperlipidemia: Secondary | ICD-10-CM | POA: Diagnosis not present

## 2019-02-11 ENCOUNTER — Encounter: Payer: Self-pay | Admitting: *Deleted

## 2019-02-11 ENCOUNTER — Ambulatory Visit (INDEPENDENT_AMBULATORY_CARE_PROVIDER_SITE_OTHER): Payer: Medicare Other | Admitting: Cardiology

## 2019-02-11 ENCOUNTER — Encounter: Payer: Self-pay | Admitting: Cardiology

## 2019-02-11 ENCOUNTER — Other Ambulatory Visit: Payer: Self-pay

## 2019-02-11 VITALS — BP 180/90 | HR 82 | Ht 67.0 in | Wt 163.0 lb

## 2019-02-11 DIAGNOSIS — I6340 Cerebral infarction due to embolism of unspecified cerebral artery: Secondary | ICD-10-CM

## 2019-02-11 DIAGNOSIS — I10 Essential (primary) hypertension: Secondary | ICD-10-CM | POA: Diagnosis not present

## 2019-02-11 DIAGNOSIS — R011 Cardiac murmur, unspecified: Secondary | ICD-10-CM

## 2019-02-11 DIAGNOSIS — K224 Dyskinesia of esophagus: Secondary | ICD-10-CM | POA: Insufficient documentation

## 2019-02-11 DIAGNOSIS — R0600 Dyspnea, unspecified: Secondary | ICD-10-CM

## 2019-02-11 DIAGNOSIS — E782 Mixed hyperlipidemia: Secondary | ICD-10-CM | POA: Insufficient documentation

## 2019-02-11 DIAGNOSIS — Z86718 Personal history of other venous thrombosis and embolism: Secondary | ICD-10-CM | POA: Insufficient documentation

## 2019-02-11 DIAGNOSIS — N952 Postmenopausal atrophic vaginitis: Secondary | ICD-10-CM | POA: Insufficient documentation

## 2019-02-11 DIAGNOSIS — E785 Hyperlipidemia, unspecified: Secondary | ICD-10-CM | POA: Insufficient documentation

## 2019-02-11 DIAGNOSIS — G250 Essential tremor: Secondary | ICD-10-CM | POA: Insufficient documentation

## 2019-02-11 MED ORDER — SPIRONOLACTONE 25 MG PO TABS: ORAL_TABLET | ORAL | 1 refills | Status: DC

## 2019-02-11 NOTE — Progress Notes (Signed)
Cardiology Office Note:    Date:  02/11/2019   ID:  Roberta Mercer, DOB 03/07/1941, MRN [id number]  PCP:  Lise Auer, MD  Cardiologist:  No primary care provider on file.  Electrophysiologist:  None   Referring MD: Lise Auer, MD   The patient was referred for shortness of breath  History of Present Illness:    Roberta Mercer is a 78 y.o. 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  Patient tells me that over the last several months she has been experiencing increasing shortness of breath.  She denies any chest pain, lightheadedness or dizziness.  But notes that the shortness of breath is bothersome concern to interfere with her daily activities.  No other complaints at this time.    Past Medical History:  Diagnosis Date   Esophageal spasm    Essential tremor    History of stroke 11/14/2014   History of venous thromboembolism    Hyperlipidemia    Post-menopausal atrophic vaginitis     Past Surgical History:  Procedure Laterality Date   CATARACT EXTRACTION     ELBOW SURGERY Left    to repair nerve/ not successful   TRIGGER FINGER RELEASE      Current Medications: Current Meds  Medication Sig   aspirin EC 81 MG tablet Take 81 mg by mouth daily.   Biotin 5000 MCG CAPS Take by mouth daily.   Borage, Borago officinalis, (BORAGE 1000 PO) Take 1,000 mg by mouth 2 (two) times daily.   Calcium Carb-Cholecalciferol (CALCIUM 600 + D PO) Take by mouth daily.   cholecalciferol (VITAMIN D3) 25 MCG (1000 UT) tablet Take 1,000 Units by mouth daily.   Cobalamin Combinations (VITAMIN B12-FOLIC ACID) 500-400 MCG TABS Take by mouth daily.   COLLAGEN PO Take 8,000 mg by mouth daily.   estradiol (ESTRACE) 0.1 MG/GM vaginal cream Use every Monday, Wednesday and Friday   fexofenadine  (ALLEGRA) 180 MG tablet Take 180 mg by mouth daily.   magnesium gluconate (MAGONATE) 500 MG tablet Take 500 mg by mouth 2 (two) times daily.   medroxyPROGESTERone (PROVERA) 5 MG tablet 5 mg.    Omega-3 Fatty Acids (FISH OIL) 1000 MG CAPS Take 1,000 mg by mouth daily.   pyridOXINE (VITAMIN B-6) 100 MG tablet Take 100 mg by mouth daily.   rosuvastatin (CRESTOR) 10 MG tablet Take 10 mg by mouth daily.   thiamine 100 MG tablet Take 100 mg by mouth daily.   topiramate (TOPAMAX) 25 MG tablet 25 mg daily. Take 1-2 daily for tremors   warfarin (COUMADIN) 2.5 MG tablet Take 2.5 mg by mouth daily.     Allergies:   Bactrim [sulfamethoxazole-trimethoprim] and Diovan [valsartan]   Social History   Socioeconomic History   Marital status: Single    Spouse name: Not on file   Number of children: Not on file   Years of education: Not on file   Highest education level: Not on file  Occupational History   Not on file  Social Needs   Financial resource strain: Not on file   Food insecurity    Worry: Not on file    Inability: Not on file   Transportation needs    Medical: Not on file    Non-medical: Not on file  Tobacco Use   Smoking status: Former Smoker  Quit date: 71    Years since quitting: 44.8   Smokeless tobacco: Never Used  Substance and Sexual Activity   Alcohol use: Never    Frequency: Never   Drug use: Never   Sexual activity: Not on file  Lifestyle   Physical activity    Days per week: Not on file    Minutes per session: Not on file   Stress: Not on file  Relationships   Social connections    Talks on phone: Not on file    Gets together: Not on file    Attends religious service: Not on file    Active member of club or organization: Not on file    Attends meetings of clubs or organizations: Not on file    Relationship status: Not on file  Other Topics Concern   Not on file  Social History Narrative   Not on file     Family History: The  patient's family history includes Heart disease in her mother; Vascular Disease in her maternal grandmother.  ROS:   Review of Systems  Constitution: Negative for decreased appetite, fever and weight gain.  HENT: Negative for congestion, ear discharge, hoarse voice and sore throat.   Eyes: Negative for discharge, redness, vision loss in right eye and visual halos.  Cardiovascular: Negative for chest pain, dyspnea on exertion, leg swelling, orthopnea and palpitations.  Respiratory: Negative for cough, hemoptysis, shortness of breath and snoring.   Endocrine: Negative for heat intolerance and polyphagia.  Hematologic/Lymphatic: Negative for bleeding problem. Does not bruise/bleed easily.  Skin: Negative for flushing, nail changes, rash and suspicious lesions.  Musculoskeletal: Negative for arthritis, joint pain, muscle cramps, myalgias, neck pain and stiffness.  Gastrointestinal: Negative for abdominal pain, bowel incontinence, diarrhea and excessive appetite.  Genitourinary: Negative for decreased libido, genital sores and incomplete emptying.  Neurological: Negative for brief paralysis, focal weakness, headaches and loss of balance.  Psychiatric/Behavioral: Negative for altered mental status, depression and suicidal ideas.  Allergic/Immunologic: Negative for HIV exposure and persistent infections.    EKGs/Labs/Other Studies Reviewed:    The following studies were reviewed today:   EKG:  The ekg ordered today demonstrates sinus rhythm, heart rate 78 beats per minute.  Transthoracic echocardiogram TTE morning July 2016 reports normal left ventricular chamber size.  Visually estimate ejection fraction 50 to 55%.  Abnormal left ventricular relaxation.  Mild AR, mild MR, mild TR  CTA of the head without contrast in 2016 reported negative CT of the brain.  Recent Labs: No results found for requested labs within last 8760 hours.  Recent Lipid Panel No results found for: CHOL, TRIG, HDL,  CHOLHDL, VLDL, LDLCALC, LDLDIRECT  Physical Exam:    VS:  BP (!) 180/90 (BP Location: Right Arm, Patient Position: Sitting, Cuff Size: Normal)    Pulse 82    Ht  (1.702 m)    Wt 163 lb (73.9 kg)    SpO2 95%    BMI 25.53 kg/m     Wt Readings from Last 3 Encounters:  02/11/19 163 lb (73.9 kg)  02/04/19 160 lb (72.6 kg)     GEN: Well nourished, well developed in no acute distress HEENT: Normal NECK: No JVD; No carotid bruits LYMPHATICS: No lymphadenopathy CARDIAC: S1S2 noted,RRR, 3/6 holosystolic murmurs, rubs, gallops RESPIRATORY: Bilateral crackles mid lung without  wheezing or rhonchi  ABDOMEN: Soft, non-tender, non-distended, +bowel sounds, no guarding. EXTREMITIES: Bilateral ankle edema, No cyanosis, no clubbing MUSCULOSKELETAL: No deformity  SKIN: Warm and dry NEUROLOGIC:  Alert and oriented x 3, non-focal PSYCHIATRIC:  Normal affect, good insight  ASSESSMENT:    1. Dyspnea, unspecified type   2. Systolic murmur   3. Cerebrovascular accident (CVA) due to embolism of cerebral artery ([place])   4. Mixed hyperlipidemia   5. Hypertension, unspecified type    PLAN:    1.  Dyspnea-the patient does have bilateral crackles, as well as trace ankle edema.  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  Therefore will start patient on Aldactone 12.5 mg 3 times weekly.  She says she is allergic to sulfa drugs and has deferred any trial of loop or thiazide diuretics.  In addition I will order a transthoracic echocardigram to assess her valvular heart disease.   2.  Hypertension-her blood pressure is elevated.  But she tells me her systolic is usually in the 120s and per report from her PCP office her last blood pressure there was 110/70 mmHg.  She was asked to watch her blood pressure on Aldactone 25 mg as well.    3.  Hyperlipidemia-he has been restarted on her Crestor 10 mg daily.  Follow lipid profile is pending which she says she will get at her PCP office  4.  Blood work will be  performed today.  Includes BMP and mag.  The patient is in agreement with the above plan. The patient left the office in stable condition.  The patient will follow up in 2 to 3 weeks or sooner if needed.   Medication Adjustments/Labs and Tests Ordered: Current medicines are reviewed at length with the patient today.  Concerns regarding medicines are outlined above.  Orders Placed This Encounter  Procedures   Basic Metabolic Panel (BMET)   Basic Metabolic Panel (BMET)   EKG 12-Lead   ECHOCARDIOGRAM COMPLETE   Meds ordered this encounter  Medications   spironolactone (ALDACTONE) 25 MG tablet    Sig: Take 1/2 tab (12.5 MG) on Tues ,Thurs, and Saturdays    Dispense:  45 tablet    Refill:  1    Patient Instructions  Medication Instructions:  Your physician has recommended you make the following change in your medication:   START: SPIRONOLACTONE 25 mg Take 1/2 tab (12.5 Mg) on Tues,Thurs and Saturdays  *If you need a refill on your cardiac medications before your next appointment, please call your pharmacy*  Lab Work: Your physician recommends that you return for lab work in: Today BMP  2 WEEKS: BMP  If you have labs (blood work) drawn today and your tests are completely normal, you will receive your results only by:  [place] (if you have [place]) OR  A paper copy in the mail If you have any lab test that is abnormal or we need to change your treatment, we will call you to review the results.  Testing/Procedures: Your physician has requested that you have an echocardiogram. Echocardiography is a painless test that uses sound waves to create images of your heart. It provides your doctor with information about the size and shape of your heart and how well your hearts chambers and valves are working. This procedure takes approximately one hour. There are no restrictions for this procedure.    Follow-Up: At [facility], you and your health needs are our  priority.  As part of our continuing mission to provide you with exceptional heart care, we have created designated Provider Care Teams.  These Care Teams include your primary Cardiologist (physician) and Advanced Practice Providers (APPs -  Physician Assistants and  Nurse Practitioners) who all work together to provide you with the care you need, when you need it.  Your next appointment:   2-3 weeks  The format for your next appointment:   In Person  Provider:   Thomasene RippleKardie Regla Fitzgibbon, DO  Other Instructions  Echocardiogram An echocardiogram is a procedure that uses painless sound waves (ultrasound) to produce an image of the heart. Images from an echocardiogram can provide important information about:  Signs of coronary artery disease (CAD).  Aneurysm detection. An aneurysm is a weak or damaged part of an artery wall that bulges out from the normal force of blood pumping through the body.  Heart size and shape. Changes in the size or shape of the heart can be associated with certain conditions, including heart failure, aneurysm, and CAD.  Heart muscle function.  Heart valve function.  Signs of a past heart attack.  Fluid buildup around the heart.  Thickening of the heart muscle.  A tumor or infectious growth around the heart valves. Tell a health care provider about:  Any allergies you have.  All medicines you are taking, including vitamins, herbs, eye drops, creams, and over-the-counter medicines.  Any blood disorders you have.  Any surgeries you have had.  Any medical conditions you have.  Whether you are pregnant or may be pregnant. What are the risks? Generally, this is a safe procedure. However, problems may occur, including:  Allergic reaction to dye (contrast) that may be used during the procedure. What happens before the procedure? No specific preparation is needed. You may eat and drink normally. What happens during the procedure?   An IV tube may be inserted  into one of your veins.  You may receive contrast through this tube. A contrast is an injection that improves the quality of the pictures from your heart.  A gel will be applied to your chest.  A wand-like tool (transducer) will be moved over your chest. The gel will help to transmit the sound waves from the transducer.  The sound waves will harmlessly bounce off of your heart to allow the heart images to be captured in real-time motion. The images will be recorded on a computer. The procedure may vary among health care providers and hospitals. What happens after the procedure?  You may return to your normal, everyday life, including diet, activities, and medicines, unless your health care provider tells you not to do that. Summary  An echocardiogram is a procedure that uses painless sound waves (ultrasound) to produce an image of the heart.  Images from an echocardiogram can provide important information about the size and shape of your heart, heart muscle function, heart valve function, and fluid buildup around your heart.  You do not need to do anything to prepare before this procedure. You may eat and drink normally.  After the echocardiogram is completed, you may return to your normal, everyday life, unless your health care provider tells you not to do that. This information is not intended to replace advice given to you by your health care provider. Make sure you discuss any questions you have with your health care provider. Document Released: 03/31/2000 Document Revised: 07/25/2018 Document Reviewed: 05/06/2016 Elsevier Patient Education  2020 [employer]      Adopting a Healthy Lifestyle.  Know what a healthy weight is for you (roughly BMI <25) and aim to maintain this   Aim for 7+ servings of fruits and vegetables daily   65-80+ fluid ounces of water or unsweet tea  for healthy kidneys   Limit to max 1 drink of alcohol per day; avoid smoking/tobacco   Limit animal  fats in diet for cholesterol and heart health - choose grass fed whenever available   Avoid highly processed foods, and foods high in saturated/trans fats   Aim for low stress - take time to unwind and care for your mental health   Aim for 150 min of moderate intensity exercise weekly for heart health, and weights twice weekly for bone health   Aim for 7-9 hours of sleep daily   When it comes to diets, agreement about the perfect plan isnt easy to find, even among the experts. Experts at the [facility] [employer] developed an idea known as the Healthy Eating Plate. Just imagine a plate divided into logical, healthy portions.   The emphasis is on diet quality:   Load up on vegetables and fruits - one-half of your plate: Aim for color and variety, and remember that potatoes dont count.   Go for whole grains - one-quarter of your plate: Whole wheat, barley, wheat berries, quinoa, oats, brown rice, and foods made with them. If you want pasta, go with whole wheat pasta.   Protein power - one-quarter of your plate: Fish, chicken, beans, and nuts are all healthy, versatile protein sources. Limit red meat.   The diet, however, does go beyond the plate, offering a few other suggestions.   Use healthy plant oils, such as olive, canola, soy, corn, sunflower and peanut. Check the labels, and avoid partially hydrogenated oil, which have unhealthy trans fats.   If youre thirsty, drink water. Coffee and tea are good in moderation, but skip sugary drinks and limit milk and dairy products to one or two daily servings.   The type of carbohydrate in the diet is more important than the amount. Some sources of carbohydrates, such as vegetables, fruits, whole grains, and beans-are healthier than others.   Finally, stay active  Signed, Thomasene Ripple, DO  02/11/2019 1:21 PM    [facility]

## 2019-02-11 NOTE — Patient Instructions (Signed)
Medication Instructions:  Your physician has recommended you make the following change in your medication:   START: SPIRONOLACTONE 25 mg Take 1/2 tab (12.5 Mg) on Tues,Thurs and Saturdays  *If you need a refill on your cardiac medications before your next appointment, please call your pharmacy*  Lab Work: Your physician recommends that you return for lab work in: Today BMP  2 WEEKS: BMP  If you have labs (blood work) drawn today and your tests are completely normal, you will receive your results only by: Marland Kitchen MyChart Message (if you have MyChart) OR . A paper copy in the mail If you have any lab test that is abnormal or we need to change your treatment, we will call you to review the results.  Testing/Procedures: Your physician has requested that you have an echocardiogram. Echocardiography is a painless test that uses sound waves to create images of your heart. It provides your doctor with information about the size and shape of your heart and how well your heart's chambers and valves are working. This procedure takes approximately one hour. There are no restrictions for this procedure.    Follow-Up: At [facility], you and your health needs are our priority.  As part of our continuing mission to provide you with exceptional heart care, we have created designated Provider Care Teams.  These Care Teams include your primary Cardiologist (physician) and Advanced Practice Providers (APPs -  Physician Assistants and Nurse Practitioners) who all work together to provide you with the care you need, when you need it.  Your next appointment:   2-3 weeks  The format for your next appointment:   In Person  Provider:   Berniece Salines, DO  Other Instructions  Echocardiogram An echocardiogram is a procedure that uses painless sound waves (ultrasound) to produce an image of the heart. Images from an echocardiogram can provide important information about:  Signs of coronary artery disease  (CAD).  Aneurysm detection. An aneurysm is a weak or damaged part of an artery wall that bulges out from the normal force of blood pumping through the body.  Heart size and shape. Changes in the size or shape of the heart can be associated with certain conditions, including heart failure, aneurysm, and CAD.  Heart muscle function.  Heart valve function.  Signs of a past heart attack.  Fluid buildup around the heart.  Thickening of the heart muscle.  A tumor or infectious growth around the heart valves. Tell a health care provider about:  Any allergies you have.  All medicines you are taking, including vitamins, herbs, eye drops, creams, and over-the-counter medicines.  Any blood disorders you have.  Any surgeries you have had.  Any medical conditions you have.  Whether you are pregnant or may be pregnant. What are the risks? Generally, this is a safe procedure. However, problems may occur, including:  Allergic reaction to dye (contrast) that may be used during the procedure. What happens before the procedure? No specific preparation is needed. You may eat and drink normally. What happens during the procedure?   An IV tube may be inserted into one of your veins.  You may receive contrast through this tube. A contrast is an injection that improves the quality of the pictures from your heart.  A gel will be applied to your chest.  A wand-like tool (transducer) will be moved over your chest. The gel will help to transmit the sound waves from the transducer.  The sound waves will harmlessly bounce off of  your heart to allow the heart images to be captured in real-time motion. The images will be recorded on a computer. The procedure may vary among health care providers and hospitals. What happens after the procedure?  You may return to your normal, everyday life, including diet, activities, and medicines, unless your health care provider tells you not to do that. Summary   An echocardiogram is a procedure that uses painless sound waves (ultrasound) to produce an image of the heart.  Images from an echocardiogram can provide important information about the size and shape of your heart, heart muscle function, heart valve function, and fluid buildup around your heart.  You do not need to do anything to prepare before this procedure. You may eat and drink normally.  After the echocardiogram is completed, you may return to your normal, everyday life, unless your health care provider tells you not to do that. This information is not intended to replace advice given to you by your health care provider. Make sure you discuss any questions you have with your health care provider. Document Released: 03/31/2000 Document Revised: 07/25/2018 Document Reviewed: 05/06/2016 Elsevier Patient Education  2020 [employer].

## 2019-02-12 LAB — BASIC METABOLIC PANEL
BUN/Creatinine Ratio: 29 — ABNORMAL HIGH (ref 12–28)
BUN: 28 mg/dL — ABNORMAL HIGH (ref 8–27)
CO2: 23 mmol/L (ref 20–29)
Calcium: 10.3 mg/dL (ref 8.7–10.3)
Chloride: 108 mmol/L — ABNORMAL HIGH (ref 96–106)
Creatinine, Ser: 0.96 mg/dL (ref 0.57–1.00)
GFR calc Af Amer: 66 mL/min/{1.73_m2} (ref >59)
GFR calc non Af Amer: 57 mL/min/{1.73_m2} — ABNORMAL LOW (ref >59)
Glucose: 90 mg/dL (ref 65–99)
Potassium: 4.3 mmol/L (ref 3.5–5.2)
Sodium: 145 mmol/L — ABNORMAL HIGH (ref 134–144)

## 2019-02-17 DIAGNOSIS — Z7901 Long term (current) use of anticoagulants: Secondary | ICD-10-CM | POA: Diagnosis not present

## 2019-03-03 ENCOUNTER — Encounter: Payer: Self-pay | Admitting: Cardiology

## 2019-03-03 ENCOUNTER — Ambulatory Visit (INDEPENDENT_AMBULATORY_CARE_PROVIDER_SITE_OTHER): Payer: Medicare Other | Admitting: Cardiology

## 2019-03-03 ENCOUNTER — Telehealth: Payer: Self-pay | Admitting: Cardiology

## 2019-03-03 ENCOUNTER — Other Ambulatory Visit: Payer: Self-pay

## 2019-03-03 VITALS — BP 170/90 | HR 79 | Ht 67.0 in | Wt 158.0 lb

## 2019-03-03 DIAGNOSIS — E782 Mixed hyperlipidemia: Secondary | ICD-10-CM | POA: Diagnosis not present

## 2019-03-03 DIAGNOSIS — R0602 Shortness of breath: Secondary | ICD-10-CM

## 2019-03-03 DIAGNOSIS — Z8673 Personal history of transient ischemic attack (TIA), and cerebral infarction without residual deficits: Secondary | ICD-10-CM | POA: Diagnosis not present

## 2019-03-03 DIAGNOSIS — I10 Essential (primary) hypertension: Secondary | ICD-10-CM

## 2019-03-03 DIAGNOSIS — Z7901 Long term (current) use of anticoagulants: Secondary | ICD-10-CM | POA: Diagnosis not present

## 2019-03-03 HISTORY — DX: Essential (primary) hypertension: I10

## 2019-03-03 MED ORDER — NADOLOL 20 MG PO TABS: 20.0000 mg | ORAL_TABLET | Freq: Every day | ORAL | 1 refills | Status: DC

## 2019-03-03 NOTE — Telephone Encounter (Signed)
Called patient pharmacy back. They state her nadolol will is not covered by insurance and will cost $70 . Approved meds by insurance are atenolol,carvedilol,metoprolol tartrate, Toprol and bisoprolol. Please advise

## 2019-03-03 NOTE — Patient Instructions (Signed)
Medication Instructions:  Your physician has recommended you make the following change in your medication:   START: Nadolol 20 mg Take 1 tab daily  *If you need a refill on your cardiac medications before your next appointment, please call your pharmacy*  Lab Work: None If you have labs (blood work) drawn today and your tests are completely normal, you will receive your results only by: Marland Kitchen MyChart Message (if you have MyChart) OR . A paper copy in the mail If you have any lab test that is abnormal or we need to change your treatment, we will call you to review the results.  Testing/Procedures: None  Follow-Up: At [facility], you and your health needs are our priority.  As part of our continuing mission to provide you with exceptional heart care, we have created designated Provider Care Teams.  These Care Teams include your primary Cardiologist (physician) and Advanced Practice Providers (APPs -  Physician Assistants and Nurse Practitioners) who all work together to provide you with the care you need, when you need it.  Your next appointment:   2 months   The format for your next appointment:   In Person  Provider:   Berniece Salines, DO  Other Instructions

## 2019-03-03 NOTE — Progress Notes (Signed)
Cardiology Office Note:    Date:  03/03/2019   ID:  Roberta Mercer, DOB August 04, 1940, MRN [id number]  PCP:  Lise AuerKhan, Jaber A, MD  Cardiologist:  No primary care provider on file.  Electrophysiologist:  None   Referring MD: Lise AuerKhan, Jaber A, MD   Chief Complaint  Patient presents with  . Follow-up    History of Present Illness:    Roberta Mercer is a 78 y.o. 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   I did see the patient on February 11, 2019 at that time she reported she has been breath on exertion.  Her physical exam was consistent with bilateral leg edema, bibasilar crackles and a cardiac murmur.  With this information the patient was started on Aldactone 12.5 mg daily.  A transthoracic echocardiogram was ordered.    Patient was then asked to take her blood pressure at home daily.  She comes today with her blood pressure recordings which does have an average blood pressure 150/70 mmHg.  She reports that shortness of breath has improved slightly.  She denies any chest pain, nausea, vomiting, lightheadedness or dizziness.   Past Medical History:  Diagnosis Date  . Esophageal spasm   . Essential tremor   . History of stroke 11/14/2014  . History of venous thromboembolism   . Hyperlipidemia   . Post-menopausal atrophic vaginitis     Past Surgical History:  Procedure Laterality Date  . CATARACT EXTRACTION    . ELBOW SURGERY Left    to repair nerve/ not successful  . TRIGGER FINGER RELEASE      Current Medications: Current Meds  Medication Sig  . aspirin EC 81 MG tablet Take 81 mg by mouth daily.  . Biotin 5000 MCG CAPS Take by mouth daily.  Florian Buff. Borage, Borago officinalis, (BORAGE 1000 PO) Take 1,000 mg by mouth 2 (two) times daily.  . Calcium Carb-Cholecalciferol (CALCIUM 600 + D PO) Take by mouth daily.  . cholecalciferol (VITAMIN D3) 25  MCG (1000 UT) tablet Take 1,000 Units by mouth daily.  . Cobalamin Combinations (VITAMIN B12-FOLIC ACID) 500-400 MCG TABS Take by mouth daily.  . COLLAGEN PO Take 8,000 mg by mouth daily.  Marland Kitchen. estradiol (ESTRACE) 0.1 MG/GM vaginal cream Use every Monday, Wednesday and Friday  . fexofenadine (ALLEGRA) 180 MG tablet Take 180 mg by mouth daily.  . magnesium gluconate (MAGONATE) 500 MG tablet Take 500 mg by mouth 2 (two) times daily.  . medroxyPROGESTERone (PROVERA) 5 MG tablet 5 mg.   . Omega-3 Fatty Acids (FISH OIL) 1000 MG CAPS Take 1,000 mg by mouth daily.  Marland Kitchen. pyridOXINE (VITAMIN B-6) 100 MG tablet Take 100 mg by mouth daily.  . rosuvastatin (CRESTOR) 10 MG tablet Take 10 mg by mouth every other day.   . spironolactone (ALDACTONE) 25 MG tablet Take 1/2 tab (12.5 MG) on Tues ,Thurs, and Saturdays  . thiamine 100 MG tablet Take 100 mg by mouth daily.  Marland Kitchen. topiramate (TOPAMAX) 25 MG tablet 25 mg daily. Take 1-2 daily for tremors  . warfarin (COUMADIN) 2.5 MG tablet Take 2.5 mg by mouth daily.     Allergies:   Bactrim [sulfamethoxazole-trimethoprim] and Diovan [valsartan]   Social History   Socioeconomic History  . Marital status: Single    Spouse name: Not on file  . Number of children: Not on file  . Years of education: Not on file  .  Highest education level: Not on file  Occupational History  . Not on file  Social Needs  . Financial resource strain: Not on file  . Food insecurity    Worry: Not on file    Inability: Not on file  . Transportation needs    Medical: Not on file    Non-medical: Not on file  Tobacco Use  . Smoking status: Former Smoker    Quit date: 1976    Years since quitting: 44.9  . Smokeless tobacco: Never Used  Substance and Sexual Activity  . Alcohol use: Never    Frequency: Never  . Drug use: Never  . Sexual activity: Not on file  Lifestyle  . Physical activity    Days per week: Not on file    Minutes per session: Not on file  . Stress: Not on file   Relationships  . Social Musician on phone: Not on file    Gets together: Not on file    Attends religious service: Not on file    Active member of club or organization: Not on file    Attends meetings of clubs or organizations: Not on file    Relationship status: Not on file  Other Topics Concern  . Not on file  Social History Narrative  . Not on file     Family History: The patient's family history includes Heart disease in her mother; Vascular Disease in her maternal grandmother.  ROS:   Review of Systems  Constitution: Negative for decreased appetite, fever and weight gain.  HENT: Negative for congestion, ear discharge, hoarse voice and sore throat.   Eyes: Negative for discharge, redness, vision loss in right eye and visual halos.  Cardiovascular: Reports shortness of breath.  Negative for chest pain, leg swelling, orthopnea and palpitations.  Respiratory: Negative for cough, hemoptysis, shortness of breath and snoring.   Endocrine: Negative for heat intolerance and polyphagia.  Hematologic/Lymphatic: Negative for bleeding problem. Does not bruise/bleed easily.  Skin: Negative for flushing, nail changes, rash and suspicious lesions.  Musculoskeletal: Negative for arthritis, joint pain, muscle cramps, myalgias, neck pain and stiffness.  Gastrointestinal: Negative for abdominal pain, bowel incontinence, diarrhea and excessive appetite.  Genitourinary: Negative for decreased libido, genital sores and incomplete emptying.  Neurological: Negative for brief paralysis, focal weakness, headaches and loss of balance.  Psychiatric/Behavioral: Negative for altered mental status, depression and suicidal ideas.  Allergic/Immunologic: Negative for HIV exposure and persistent infections.    EKGs/Labs/Other Studies Reviewed:    The following studies were reviewed today:   EKG: None today  Recent Labs: 02/11/2019: BUN 28; Creatinine, Ser 0.96; Potassium 4.3; Sodium 145   Recent Lipid Panel No results found for: CHOL, TRIG, HDL, CHOLHDL, VLDL, LDLCALC, LDLDIRECT  Physical Exam:    VS:  BP (!) 170/90 (BP Location: Left Arm, Patient Position: Sitting, Cuff Size: Normal)   Pulse 79   Ht 5\' 7"  (1.702 m)   Wt 158 lb (71.7 kg)   SpO2 96%   BMI 24.75 kg/m     Wt Readings from Last 3 Encounters:  03/03/19 158 lb (71.7 kg)  02/11/19 163 lb (73.9 kg)  02/04/19 160 lb (72.6 kg)     GEN: Well nourished, well developed in no acute distress HEENT: Normal NECK: No JVD; No carotid bruits LYMPHATICS: No lymphadenopathy CARDIAC: S1S2 noted,RRR, 2/6 holosystolic murmurs, rubs, gallops RESPIRATORY:  Clear to auscultation without rales, wheezing or rhonchi  ABDOMEN: Soft, non-tender, non-distended, +bowel sounds, no guarding. EXTREMITIES: No  edema, No cyanosis, no clubbing MUSCULOSKELETAL:  No edema; No deformity  SKIN: Warm and dry NEUROLOGIC:  Alert and oriented x 3, non-focal PSYCHIATRIC:  Normal affect, good insight  ASSESSMENT:    1. Essential hypertension   2. Shortness of breath   3. Mixed hyperlipidemia   4. History of stroke    PLAN:     1.  She still does have some shortness of breath though states that it has improved somehow.  She will be continued on the Aldactone 12.5 mg daily.  Her echocardiogram is scheduled on March 21, 2019. She was hypertensive in the office today therefore I am going to add nadolol 20 mg daily to her regimen-the patient has been on this medication in the past and she said he agreed with her.  2. Hyperlipidemia-patient will be continued on her current Crestor dose.  3.  History of CVA-continue patient home warfarin, and Crestor.   The patient is in agreement with the above plan. The patient left the office in stable condition.  The patient will follow up in 2 months   Medication Adjustments/Labs and Tests Ordered: Current medicines are reviewed at length with the patient today.  Concerns regarding medicines are  outlined above.  No orders of the defined types were placed in this encounter.  Meds ordered this encounter  Medications  . nadolol (CORGARD) 20 MG tablet    Sig: Take 1 tablet (20 mg total) by mouth daily.    Dispense:  90 tablet    Refill:  1    Patient Instructions  Medication Instructions:  Your physician has recommended you make the following change in your medication:   START: Nadolol 20 mg Take 1 tab daily  *If you need a refill on your cardiac medications before your next appointment, please call your pharmacy*  Lab Work: None If you have labs (blood work) drawn today and your tests are completely normal, you will receive your results only by: Marland Kitchen MyChart Message (if you have MyChart) OR . A paper copy in the mail If you have any lab test that is abnormal or we need to change your treatment, we will call you to review the results.  Testing/Procedures: None  Follow-Up: At [facility], you and your health needs are our priority.  As part of our continuing mission to provide you with exceptional heart care, we have created designated Provider Care Teams.  These Care Teams include your primary Cardiologist (physician) and Advanced Practice Providers (APPs -  Physician Assistants and Nurse Practitioners) who all work together to provide you with the care you need, when you need it.  Your next appointment:   2 months   The format for your next appointment:   In Person  Provider:   Thomasene Ripple, DO  Other Instructions      Adopting a Healthy Lifestyle.  Know what a healthy weight is for you (roughly BMI <25) and aim to maintain this   Aim for 7+ servings of fruits and vegetables daily   65-80+ fluid ounces of water or unsweet tea for healthy kidneys   Limit to max 1 drink of alcohol per day; avoid smoking/tobacco   Limit animal fats in diet for cholesterol and heart health - choose grass fed whenever available   Avoid highly processed foods, and foods high  in saturated/trans fats   Aim for low stress - take time to unwind and care for your mental health   Aim for 150 min of moderate intensity  exercise weekly for heart health, and weights twice weekly for bone health   Aim for 7-9 hours of sleep daily   When it comes to diets, agreement about the perfect plan isnt easy to find, even among the experts. Experts at the [employer] developed an idea known as the Healthy Eating Plate. Just imagine a plate divided into logical, healthy portions.   The emphasis is on diet quality:   Load up on vegetables and fruits - one-half of your plate: Aim for color and variety, and remember that potatoes dont count.   Go for whole grains - one-quarter of your plate: Whole wheat, barley, wheat berries, quinoa, oats, brown rice, and foods made with them. If you want pasta, go with whole wheat pasta.   Protein power - one-quarter of your plate: Fish, chicken, beans, and nuts are all healthy, versatile protein sources. Limit red meat.   The diet, however, does go beyond the plate, offering a few other suggestions.   Use healthy plant oils, such as olive, canola, soy, corn, sunflower and peanut. Check the labels, and avoid partially hydrogenated oil, which have unhealthy trans fats.   If youre thirsty, drink water. Coffee and tea are good in moderation, but skip sugary drinks and limit milk and dairy products to one or two daily servings.   The type of carbohydrate in the diet is more important than the amount. Some sources of carbohydrates, such as vegetables, fruits, whole grains, and beans-are healthier than others.   Finally, stay active  Signed, Berniece Salines, DO  03/03/2019 2:58 PM    [place]

## 2019-03-03 NOTE — Telephone Encounter (Signed)
Please use coreg 3.125 mg BID

## 2019-03-03 NOTE — Telephone Encounter (Signed)
Please call [facility] regarding patient

## 2019-03-04 ENCOUNTER — Ambulatory Visit: Payer: Medicare Other | Admitting: Cardiology

## 2019-03-10 ENCOUNTER — Telehealth: Payer: Self-pay | Admitting: *Deleted

## 2019-03-10 MED ORDER — CARVEDILOL 3.125 MG PO TABS: 3.1250 mg | ORAL_TABLET | Freq: Two times a day (BID) | ORAL | 1 refills | Status: DC

## 2019-03-10 NOTE — Telephone Encounter (Signed)
[facility] called about pt's Nadolol not being covered. There is an encounter in cht about this and instructions to call in Carvedilol:  Please use coreg 3.125 mg BID  Rx will be sent to [facility]

## 2019-03-17 DIAGNOSIS — Z7901 Long term (current) use of anticoagulants: Secondary | ICD-10-CM | POA: Diagnosis not present

## 2019-03-20 DIAGNOSIS — H35412 Lattice degeneration of retina, left eye: Secondary | ICD-10-CM | POA: Diagnosis not present

## 2019-03-21 ENCOUNTER — Other Ambulatory Visit: Payer: Self-pay

## 2019-03-21 ENCOUNTER — Ambulatory Visit (INDEPENDENT_AMBULATORY_CARE_PROVIDER_SITE_OTHER): Payer: Medicare Other

## 2019-03-21 DIAGNOSIS — R0600 Dyspnea, unspecified: Secondary | ICD-10-CM

## 2019-03-21 DIAGNOSIS — I6340 Cerebral infarction due to embolism of unspecified cerebral artery: Secondary | ICD-10-CM

## 2019-03-21 NOTE — Progress Notes (Signed)
Complete echocardiogram has been performed.  Jimmy Carolyna Yerian RDCS, RVT 

## 2019-05-05 ENCOUNTER — Ambulatory Visit (INDEPENDENT_AMBULATORY_CARE_PROVIDER_SITE_OTHER): Payer: Medicare Other | Admitting: Cardiology

## 2019-05-05 ENCOUNTER — Other Ambulatory Visit: Payer: Self-pay

## 2019-05-05 ENCOUNTER — Encounter: Payer: Self-pay | Admitting: Cardiology

## 2019-05-05 VITALS — BP 146/86 | HR 72 | Ht 67.0 in | Wt 156.6 lb

## 2019-05-05 DIAGNOSIS — I350 Nonrheumatic aortic (valve) stenosis: Secondary | ICD-10-CM | POA: Diagnosis not present

## 2019-05-05 DIAGNOSIS — I351 Nonrheumatic aortic (valve) insufficiency: Secondary | ICD-10-CM

## 2019-05-05 DIAGNOSIS — I10 Essential (primary) hypertension: Secondary | ICD-10-CM | POA: Diagnosis not present

## 2019-05-05 HISTORY — DX: Nonrheumatic aortic (valve) insufficiency: I35.1

## 2019-05-05 MED ORDER — SPIRONOLACTONE 25 MG PO TABS: 12.5000 mg | ORAL_TABLET | Freq: Every day | ORAL | 1 refills | Status: DC

## 2019-05-05 NOTE — Patient Instructions (Signed)
Medication Instructions:  Your physician has recommended you make the following change in your medication:   INCREASE: Spironolactone to 25 mg Take 1/2 tab DAILY  *If you need a refill on your cardiac medications before your next appointment, please call your pharmacy*  Lab Work: Your physician recommends that you return for lab work in: TODAY BMP   If you have labs (blood work) drawn today and your tests are completely normal, you will receive your results only by: Marland Kitchen MyChart Message (if you have MyChart) OR . A paper copy in the mail If you have any lab test that is abnormal or we need to change your treatment, we will call you to review the results.  Testing/Procedures: None  Follow-Up: At [facility], you and your health needs are our priority.  As part of our continuing mission to provide you with exceptional heart care, we have created designated Provider Care Teams.  These Care Teams include your primary Cardiologist (physician) and Advanced Practice Providers (APPs -  Physician Assistants and Nurse Practitioners) who all work together to provide you with the care you need, when you need it.  Your next appointment:   1 month(s)  The format for your next appointment:   In Person  Provider:   Thomasene Ripple, DO  Other Instructions

## 2019-05-05 NOTE — Progress Notes (Addendum)
Cardiology Office Note:    Date:  05/05/2019   ID:  Roberta Mercer, DOB 14-Apr-1941, MRN [id number]  PCP:  Mateo Flow, MD  Cardiologist:  Berniece Salines, DO  Electrophysiologist:  None   Referring MD: Mateo Flow, MD   Chief Complaint  Patient presents with  . Follow-up    2 months   History of Present Illness:    Roberta Mercer a 79 y.o.femalewith a hx ofhyperlipidemia, history of CVA she is currently on Coumadin after failing Eliquis twice and recurrent strokes(left occipital and biparietal smallembolic infarct).   I initially saw the patient on February 11, 2019 she reported that she was experiencing shortness of breath on exertion.  Her physical exam did have evidence of bilateral crackles as well as ankle edema.  There is also murmur which I do suspect valvular heart disease was contributing.  Therefore started the patient on Aldactone 12.5 mg 3 times a week in order transthoracic echocardiogram.  In the interim she follow-up due to elevated blood pressure.  At that time I started her on nadolol as she had taken his medication in the past but unfortunately her insurance company could not pay for this medication therefore we changed it to carvedilol 3.125 mg daily.     She tells me she has been taking her medications.  She reports that her shortness of breath has proved significantly.   Past Medical History:  Diagnosis Date  . Esophageal spasm   . Essential tremor   . History of stroke 11/14/2014  . History of venous thromboembolism   . Hyperlipidemia   . Post-menopausal atrophic vaginitis     Past Surgical History:  Procedure Laterality Date  . CATARACT EXTRACTION    . ELBOW SURGERY Left    to repair nerve/ not successful  . TRIGGER FINGER RELEASE      Current Medications: Current Meds  Medication Sig  . aspirin EC 81 MG tablet Take 81 mg by mouth daily.  . Biotin 5000 MCG CAPS Take by mouth daily.  Chong Sicilian, Borago officinalis, (BORAGE 1000 PO) Take 1,000  mg by mouth 2 (two) times daily.  . Calcium Carb-Cholecalciferol (CALCIUM 600 + D PO) Take by mouth daily.  . carvedilol (COREG) 3.125 MG tablet Take 1 tablet (3.125 mg total) by mouth 2 (two) times daily.  . cholecalciferol (VITAMIN D3) 25 MCG (1000 UT) tablet Take 1,000 Units by mouth daily.  . Cobalamin Combinations (VITAMIN B12-FOLIC ACID) 893-734 MCG TABS Take by mouth daily.  . COLLAGEN PO Take 8,000 mg by mouth daily.  Marland Kitchen estradiol (ESTRACE) 0.1 MG/GM vaginal cream Use every Monday, Wednesday and Friday  . fexofenadine (ALLEGRA) 180 MG tablet Take 180 mg by mouth daily.  . magnesium gluconate (MAGONATE) 500 MG tablet Take 500 mg by mouth 2 (two) times daily.  . Omega-3 Fatty Acids (FISH OIL) 1000 MG CAPS Take 1,000 mg by mouth daily.  Marland Kitchen pyridOXINE (VITAMIN B-6) 100 MG tablet Take 100 mg by mouth daily.  . rosuvastatin (CRESTOR) 10 MG tablet Take 10 mg by mouth every other day.   . thiamine 100 MG tablet Take 100 mg by mouth daily.  Marland Kitchen topiramate (TOPAMAX) 25 MG tablet 25 mg daily. Take 1-2 daily for tremors  . warfarin (COUMADIN) 2.5 MG tablet Take 2.5 mg by mouth daily.  . [DISCONTINUED] spironolactone (ALDACTONE) 25 MG tablet Take 1/2 tab (12.5 MG) on Tues ,Thurs, and Saturdays     Allergies:   Bactrim [sulfamethoxazole-trimethoprim] and Diovan [valsartan]  Social History   Socioeconomic History  . Marital status: Single    Spouse name: Not on file  . Number of children: Not on file  . Years of education: Not on file  . Highest education level: Not on file  Occupational History  . Not on file  Tobacco Use  . Smoking status: Former Smoker    Quit date: 1976    Years since quitting: 45.0  . Smokeless tobacco: Never Used  Substance and Sexual Activity  . Alcohol use: Never  . Drug use: Never  . Sexual activity: Not on file  Other Topics Concern  . Not on file  Social History Narrative  . Not on file   Social Determinants of Health   Financial Resource Strain:   .  Difficulty of Paying Living Expenses: Not on file  Food Insecurity:   . Worried About Programme researcher, broadcasting/film/video in the Last Year: Not on file  . Ran Out of Food in the Last Year: Not on file  Transportation Needs:   . Lack of Transportation (Medical): Not on file  . Lack of Transportation (Non-Medical): Not on file  Physical Activity:   . Days of Exercise per Week: Not on file  . Minutes of Exercise per Session: Not on file  Stress:   . Feeling of Stress : Not on file  Social Connections:   . Frequency of Communication with Friends and Family: Not on file  . Frequency of Social Gatherings with Friends and Family: Not on file  . Attends Religious Services: Not on file  . Active Member of Clubs or Organizations: Not on file  . Attends Banker Meetings: Not on file  . Marital Status: Not on file     Family History: The patient's family history includes Heart disease in her mother; Vascular Disease in her maternal grandmother.  ROS:   Review of Systems  Constitution: Negative for decreased appetite, fever and weight gain.  HENT: Negative for congestion, ear discharge, hoarse voice and sore throat.   Eyes: Negative for discharge, redness, vision loss in right eye and visual halos.  Cardiovascular: Negative for chest pain, dyspnea on exertion, leg swelling, orthopnea and palpitations.  Respiratory: Negative for cough, hemoptysis, shortness of breath and snoring.   Endocrine: Negative for heat intolerance and polyphagia.  Hematologic/Lymphatic: Negative for bleeding problem. Does not bruise/bleed easily.  Skin: Negative for flushing, nail changes, rash and suspicious lesions.  Musculoskeletal: Negative for arthritis, joint pain, muscle cramps, myalgias, neck pain and stiffness.  Gastrointestinal: Negative for abdominal pain, bowel incontinence, diarrhea and excessive appetite.  Genitourinary: Negative for decreased libido, genital sores and incomplete emptying.  Neurological:  Negative for brief paralysis, focal weakness, headaches and loss of balance.  Psychiatric/Behavioral: Negative for altered mental status, depression and suicidal ideas.  Allergic/Immunologic: Negative for HIV exposure and persistent infections.    EKGs/Labs/Other Studies Reviewed:    The following studies were reviewed today:   EKG: None today  TTE IMPRESSIONS 03/21/2019  1. Left ventricular ejection fraction, by visual estimation, is 55 to 60%. The left ventricle has normal function. Left ventricular septal wall thickness was severely increased. Severely increased left ventricular posterior wall thickness. There is  severely increased left ventricular hypertrophy.  2. Left ventricular diastolic parameters are consistent with Grade I diastolic dysfunction (impaired relaxation).  3. The left ventricle has no regional wall motion abnormalities.  4. Global right ventricle has normal systolic function.The right ventricular size is normal. No increase in right ventricular  wall thickness.  5. Left atrial size was normal.  6. Right atrial size was normal.  7. The mitral valve is normal in structure. No evidence of mitral valve regurgitation. No evidence of mitral stenosis.  8. The tricuspid valve is normal in structure. Tricuspid valve regurgitation is trivial.  9. Aortic valve regurgitation is mild to moderate. 10. The aortic valve has an indeterminant number of cusps. Aortic valve regurgitation is mild to moderate. Mild to moderate aortic valve stenosis. 11. The pulmonic valve was normal in structure. Pulmonic valve regurgitation is not visualized. 12. Mildly elevated pulmonary artery systolic pressure. 13. The inferior vena cava is normal in size with greater than 50% respiratory variability, suggesting right atrial pressure of 3 mmHg.  Recent Labs: 02/11/2019: BUN 28; Creatinine, Ser 0.96; Potassium 4.3; Sodium 145  Recent Lipid Panel No results found for: CHOL, TRIG, HDL, CHOLHDL, VLDL,  LDLCALC, LDLDIRECT  Physical Exam:    VS:  BP (!) 146/86   Pulse 72   Ht 5\' 7"  (1.702 m)   Wt 156 lb 9.6 oz (71 kg) Comment: per patient  SpO2 99%   BMI 24.53 kg/m     Wt Readings from Last 3 Encounters:  05/05/19 156 lb 9.6 oz (71 kg)  03/03/19 158 lb (71.7 kg)  02/11/19 163 lb (73.9 kg)     GEN: Well nourished, well developed in no acute distress HEENT: Normal NECK: No JVD; No carotid bruits LYMPHATICS: No lymphadenopathy CARDIAC: S1S2 noted,RRR, 2/6 mid systolic ejection murmurs, rubs, gallops RESPIRATORY:  Clear to auscultation without rales, wheezing or rhonchi  ABDOMEN: Soft, non-tender, non-distended, +bowel sounds, no guarding. EXTREMITIES: No edema, No cyanosis, no clubbing MUSCULOSKELETAL:  No edema; No deformity  SKIN: Warm and dry NEUROLOGIC:  Alert and oriented x 3, non-focal PSYCHIATRIC:  Normal affect, good insight  ASSESSMENT:    1. Essential hypertension   2. Nonrheumatic aortic valve insufficiency   3. Nonrheumatic aortic valve stenosis    PLAN:    1.  Shortness of breath has improved on the Aldactone.  Unfortunately she still is protective.  Therefore I am going to change her Aldactone to 12.5 mg Tuesday Thursday and Saturdays to daily.  2.  I did speak with the patient about her echocardiogram report.  All of her questions were answered.  3.  Blood work will be done today to include BMP.  The patient is in agreement with the above plan. The patient left the office in stable condition.  The patient will follow up in 1 month with a blood pressure check.    Medication Adjustments/Labs and Tests Ordered: Current medicines are reviewed at length with the patient today.  Concerns regarding medicines are outlined above.  Orders Placed This Encounter  Procedures  . Basic Metabolic Panel (BMET)   Meds ordered this encounter  Medications  . spironolactone (ALDACTONE) 25 MG tablet    Sig: Take 0.5 tablets (12.5 mg total) by mouth daily.    Dispense:   90 tablet    Refill:  1    Patient Instructions  Medication Instructions:  Your physician has recommended you make the following change in your medication:   INCREASE: Spironolactone to 25 mg Take 1/2 tab DAILY  *If you need a refill on your cardiac medications before your next appointment, please call your pharmacy*  Lab Work: Your physician recommends that you return for lab work in: TODAY BMP   If you have labs (blood work) drawn today and your tests are completely normal, you  will receive your results only by: Marland Kitchen MyChart Message (if you have MyChart) OR . A paper copy in the mail If you have any lab test that is abnormal or we need to change your treatment, we will call you to review the results.  Testing/Procedures: None  Follow-Up: At [facility], you and your health needs are our priority.  As part of our continuing mission to provide you with exceptional heart care, we have created designated Provider Care Teams.  These Care Teams include your primary Cardiologist (physician) and Advanced Practice Providers (APPs -  Physician Assistants and Nurse Practitioners) who all work together to provide you with the care you need, when you need it.  Your next appointment:   1 month(s)  The format for your next appointment:   In Person  Provider:   Thomasene Ripple, DO  Other Instructions      Adopting a Healthy Lifestyle.  Know what a healthy weight is for you (roughly BMI <25) and aim to maintain this   Aim for 7+ servings of fruits and vegetables daily   65-80+ fluid ounces of water or unsweet tea for healthy kidneys   Limit to max 1 drink of alcohol per day; avoid smoking/tobacco   Limit animal fats in diet for cholesterol and heart health - choose grass fed whenever available   Avoid highly processed foods, and foods high in saturated/trans fats   Aim for low stress - take time to unwind and care for your mental health   Aim for 150 min of moderate intensity  exercise weekly for heart health, and weights twice weekly for bone health   Aim for 7-9 hours of sleep daily   When it comes to diets, agreement about the perfect plan isnt easy to find, even among the experts. Experts at the [facility] [employer] developed an idea known as the Healthy Eating Plate. Just imagine a plate divided into logical, healthy portions.   The emphasis is on diet quality:   Load up on vegetables and fruits - one-half of your plate: Aim for color and variety, and remember that potatoes dont count.   Go for whole grains - one-quarter of your plate: Whole wheat, barley, wheat berries, quinoa, oats, brown rice, and foods made with them. If you want pasta, go with whole wheat pasta.   Protein power - one-quarter of your plate: Fish, chicken, beans, and nuts are all healthy, versatile protein sources. Limit red meat.   The diet, however, does go beyond the plate, offering a few other suggestions.   Use healthy plant oils, such as olive, canola, soy, corn, sunflower and peanut. Check the labels, and avoid partially hydrogenated oil, which have unhealthy trans fats.   If youre thirsty, drink water. Coffee and tea are good in moderation, but skip sugary drinks and limit milk and dairy products to one or two daily servings.   The type of carbohydrate in the diet is more important than the amount. Some sources of carbohydrates, such as vegetables, fruits, whole grains, and beans-are healthier than others.   Finally, stay active  Signed, Thomasene Ripple, DO  05/05/2019 1:54 PM    [facility]

## 2019-05-06 LAB — BASIC METABOLIC PANEL
BUN/Creatinine Ratio: 29 — ABNORMAL HIGH (ref 12–28)
BUN: 30 mg/dL — ABNORMAL HIGH (ref 8–27)
CO2: 22 mmol/L (ref 20–29)
Calcium: 10.1 mg/dL (ref 8.7–10.3)
Chloride: 108 mmol/L — ABNORMAL HIGH (ref 96–106)
Creatinine, Ser: 1.03 mg/dL — ABNORMAL HIGH (ref 0.57–1.00)
GFR calc Af Amer: 61 mL/min/{1.73_m2} (ref >59)
GFR calc non Af Amer: 53 mL/min/{1.73_m2} — ABNORMAL LOW (ref >59)
Glucose: 93 mg/dL (ref 65–99)
Potassium: 4.6 mmol/L (ref 3.5–5.2)
Sodium: 142 mmol/L (ref 134–144)

## 2019-05-12 DIAGNOSIS — Z7901 Long term (current) use of anticoagulants: Secondary | ICD-10-CM | POA: Diagnosis not present

## 2019-05-13 ENCOUNTER — Telehealth: Payer: Self-pay | Admitting: *Deleted

## 2019-05-13 DIAGNOSIS — I10 Essential (primary) hypertension: Secondary | ICD-10-CM

## 2019-05-13 NOTE — Telephone Encounter (Signed)
-----   Message from Thomasene Ripple, DO sent at 05/06/2019 10:19 AM EST ----- Please let the patient know that her creatinine increased slightly to 1.03 which was 0.96 two months ago. She continue her medications as prescribed.  But I would like her to repeat a BMP 2 weeks from now to monitor kidney function and potassium.

## 2019-05-13 NOTE — Telephone Encounter (Signed)
Telephone call to patient. Informed of lab results and need for repeat BMP in 2 weeks. Pt agree and had no further questions.

## 2019-05-26 DIAGNOSIS — I10 Essential (primary) hypertension: Secondary | ICD-10-CM | POA: Diagnosis not present

## 2019-05-27 LAB — BASIC METABOLIC PANEL
BUN/Creatinine Ratio: 28 (ref 12–28)
BUN: 32 mg/dL — ABNORMAL HIGH (ref 8–27)
CO2: 23 mmol/L (ref 20–29)
Calcium: 10.4 mg/dL — ABNORMAL HIGH (ref 8.7–10.3)
Chloride: 105 mmol/L (ref 96–106)
Creatinine, Ser: 1.15 mg/dL — ABNORMAL HIGH (ref 0.57–1.00)
GFR calc Af Amer: 53 mL/min/{1.73_m2} — ABNORMAL LOW (ref >59)
GFR calc non Af Amer: 46 mL/min/{1.73_m2} — ABNORMAL LOW (ref >59)
Glucose: 82 mg/dL (ref 65–99)
Potassium: 4.6 mmol/L (ref 3.5–5.2)
Sodium: 140 mmol/L (ref 134–144)

## 2019-06-05 ENCOUNTER — Telehealth (INDEPENDENT_AMBULATORY_CARE_PROVIDER_SITE_OTHER): Payer: Medicare Other | Admitting: Cardiology

## 2019-06-05 ENCOUNTER — Encounter: Payer: Self-pay | Admitting: Cardiology

## 2019-06-05 VITALS — BP 142/63 | HR 53 | Ht 67.0 in | Wt 155.0 lb

## 2019-06-05 DIAGNOSIS — I350 Nonrheumatic aortic (valve) stenosis: Secondary | ICD-10-CM | POA: Insufficient documentation

## 2019-06-05 DIAGNOSIS — I517 Cardiomegaly: Secondary | ICD-10-CM

## 2019-06-05 DIAGNOSIS — I5189 Other ill-defined heart diseases: Secondary | ICD-10-CM

## 2019-06-05 DIAGNOSIS — N1831 Chronic kidney disease, stage 3a: Secondary | ICD-10-CM

## 2019-06-05 DIAGNOSIS — I10 Essential (primary) hypertension: Secondary | ICD-10-CM | POA: Diagnosis not present

## 2019-06-05 DIAGNOSIS — I351 Nonrheumatic aortic (valve) insufficiency: Secondary | ICD-10-CM

## 2019-06-05 HISTORY — DX: Nonrheumatic aortic (valve) stenosis: I35.0

## 2019-06-05 HISTORY — DX: Cardiomegaly: I51.7

## 2019-06-05 HISTORY — DX: Chronic kidney disease, stage 3a: N18.31

## 2019-06-05 HISTORY — DX: Other ill-defined heart diseases: I51.89

## 2019-06-05 MED ORDER — CARVEDILOL 3.125 MG PO TABS: 3.1250 mg | ORAL_TABLET | Freq: Two times a day (BID) | ORAL | 1 refills | Status: DC

## 2019-06-05 MED ORDER — SPIRONOLACTONE 25 MG PO TABS: 12.5000 mg | ORAL_TABLET | Freq: Every day | ORAL | 1 refills | Status: DC

## 2019-06-05 NOTE — Patient Instructions (Signed)
Medication Instructions:  Your physician recommends that you continue on your current medications as directed. Please refer to the Current Medication list given to you today.  *If you need a refill on your cardiac medications before your next appointment, please call your pharmacy*  Lab Work: None If you have labs (blood work) drawn today and your tests are completely normal, you will receive your results only by: . MyChart Message (if you have MyChart) OR . A paper copy in the mail If you have any lab test that is abnormal or we need to change your treatment, we will call you to review the results.  Testing/Procedures: None  Follow-Up: At [facility], you and your health needs are our priority.  As part of our continuing mission to provide you with exceptional heart care, we have created designated Provider Care Teams.  These Care Teams include your primary Cardiologist (physician) and Advanced Practice Providers (APPs -  Physician Assistants and Nurse Practitioners) who all work together to provide you with the care you need, when you need it.  Your next appointment:   3 month(s)  The format for your next appointment:   In Person  Provider:   Kardie Tobb, DO  Other Instructions   

## 2019-06-05 NOTE — Progress Notes (Signed)
Telephone visit  Date:  06/05/2019   ID:  Roberta Mercer, DOB [birth year], MRN [id number]  The patient is at home The provider is at home.   PCP:  Lise Auer, MD  Cardiologist:  Thomasene Ripple, DO  Electrophysiologist:  None   Evaluation Performed: follow up visit  Chief Complaint:  Follow up for hypertension.  History of Present Illness:    Roberta Mercer a 79 y.o.femalewith a hx ofhyperlipidemia, history of CVA she is currently on Coumadin after failing Eliquis twice and recurrent strokes(left occipital and biparietal smallembolic infarct).   I initially saw the patient on February 11, 2019 she reported that she was experiencing shortness of breath on exertion.  Her physical exam did have evidence of bilateral crackles as well as ankle edema.  There is also murmur which I do suspect valvular heart disease was contributing.  Therefore started the patient on Aldactone 12.5 mg 3 times a week in order transthoracic echocardiogram.  In the interim she follow-up due to elevated blood pressure.  At that time I started her on nadolol as she had taken his medication in the past but unfortunately her insurance company could not pay for this medication therefore we changed it to carvedilol 3.125 mg daily.    She was seen on 05/05/2019 at that time she was still hypertensive - she was still short of breath. With her history of diastolic dysfunction, I increase her Aldactone to 12.5 mg daily from 3x a week.She was then continue on her coreg as well.   Today she tells me that the shortness of breath had improved. She also state that she is experiencing some lightheadedness on days that she notice her sbp in the 130s.- which she states was twice since her last visit.  Virtual visit due to weather.   The patient does not have any symptoms of COVID-19.  Past Medical History:  Diagnosis Date  . Esophageal spasm   . Essential tremor   . History of stroke 11/14/2014  . History of venous  thromboembolism   . Hyperlipidemia   . Post-menopausal atrophic vaginitis    Past Surgical History:  Procedure Laterality Date  . CATARACT EXTRACTION    . ELBOW SURGERY Left    to repair nerve/ not successful  . TRIGGER FINGER RELEASE       Current Meds  Medication Sig  . aspirin EC 81 MG tablet Take 81 mg by mouth daily.  . Biotin 5000 MCG CAPS Take by mouth daily.  Florian Buff, Borago officinalis, (BORAGE 1000 PO) Take 1,000 mg by mouth 2 (two) times daily.  . Calcium Carb-Cholecalciferol (CALCIUM 600 + D PO) Take by mouth daily.  . carvedilol (COREG) 3.125 MG tablet Take 1 tablet (3.125 mg total) by mouth 2 (two) times daily.  . cholecalciferol (VITAMIN D3) 25 MCG (1000 UT) tablet Take 1,000 Units by mouth daily.  . Cobalamin Combinations (VITAMIN B12-FOLIC ACID) 500-400 MCG TABS Take by mouth daily.  . COLLAGEN PO Take 8,000 mg by mouth daily.  Marland Kitchen estradiol (ESTRACE) 0.1 MG/GM vaginal cream Use every Monday, Wednesday and Friday  . fexofenadine (ALLEGRA) 180 MG tablet Take 180 mg by mouth daily.  . magnesium gluconate (MAGONATE) 500 MG tablet Take 500 mg by mouth daily.   . Omega-3 Fatty Acids (FISH OIL) 1000 MG CAPS Take 1,000 mg by mouth daily.  Marland Kitchen pyridOXINE (VITAMIN B-6) 100 MG tablet Take 100 mg by mouth daily.  . rosuvastatin (CRESTOR) 10 MG tablet Take 10 mg by mouth  every other day.   . spironolactone (ALDACTONE) 25 MG tablet Take 0.5 tablets (12.5 mg total) by mouth daily.  Marland Kitchen thiamine 100 MG tablet Take 100 mg by mouth daily.  Marland Kitchen topiramate (TOPAMAX) 25 MG tablet 25 mg daily. Take 1-2 daily for tremors  . warfarin (COUMADIN) 2.5 MG tablet Take 2.5 mg by mouth daily.  . [DISCONTINUED] carvedilol (COREG) 3.125 MG tablet Take 1 tablet (3.125 mg total) by mouth 2 (two) times daily.  . [DISCONTINUED] spironolactone (ALDACTONE) 25 MG tablet Take 0.5 tablets (12.5 mg total) by mouth daily.     Allergies:   Bactrim [sulfamethoxazole-trimethoprim] and Diovan [valsartan]   Social  History   Tobacco Use  . Smoking status: Former Smoker    Quit date: 1976    Years since quitting: 45.1  . Smokeless tobacco: Never Used  Substance Use Topics  . Alcohol use: Never  . Drug use: Never     Family Hx: The patient's family history includes Heart disease in her mother; Vascular Disease in her maternal grandmother.  ROS:   Review of Systems  Constitution: Negative for decreased appetite, fever and weight gain.  HENT: Negative for congestion, ear discharge, hoarse voice and sore throat.   Eyes: Negative for discharge, redness, vision loss in right eye and visual halos.  Cardiovascular: Negative for chest pain, dyspnea on exertion, leg swelling, orthopnea and palpitations.  Respiratory: Negative for cough, hemoptysis, shortness of breath and snoring.   Endocrine: Negative for heat intolerance and polyphagia.  Hematologic/Lymphatic: Negative for bleeding problem. Does not bruise/bleed easily.  Skin: Negative for flushing, nail changes, rash and suspicious lesions.  Musculoskeletal: Negative for arthritis, joint pain, muscle cramps, myalgias, neck pain and stiffness.  Gastrointestinal: Negative for abdominal pain, bowel incontinence, diarrhea and excessive appetite.  Genitourinary: Negative for decreased libido, genital sores and incomplete emptying.  Neurological: Negative for brief paralysis, focal weakness, headaches and loss of balance.  Psychiatric/Behavioral: Negative for altered mental status, depression and suicidal ideas.  Allergic/Immunologic: Negative for HIV exposure and persistent infections.    Prior CV studies:   The following studies were reviewed today:  TTE IMPRESSIONS 03/21/2019.  1. Left ventricular ejection fraction, by visual estimation, is 55 to  60%. The left ventricle has normal function. Left ventricular septal wall  thickness was severely increased. Severely increased left ventricular  posterior wall thickness. There is  severely increased  left ventricular hypertrophy.  2. Left ventricular diastolic parameters are consistent with Grade I  diastolic dysfunction (impaired relaxation).  3. The left ventricle has no regional wall motion abnormalities.  4. Global right ventricle has normal systolic function.The right  ventricular size is normal. No increase in right ventricular wall  thickness.  5. Left atrial size was normal.  6. Right atrial size was normal.  7. The mitral valve is normal in structure. No evidence of mitral valve  regurgitation. No evidence of mitral stenosis.  8. The tricuspid valve is normal in structure. Tricuspid valve  regurgitation is trivial.  9. Aortic valve regurgitation is mild to moderate.  10. The aortic valve has an indeterminant number of cusps. Aortic valve  regurgitation is mild to moderate. Mild to moderate aortic valve stenosis.  11. The pulmonic valve was normal in structure. Pulmonic valve  regurgitation is not visualized.  12. Mildly elevated pulmonary artery systolic pressure.  13. The inferior vena cava is normal in size with greater than 50%  respiratory variability, suggesting right atrial pressure of 3 mmHg.    Labs/Other Tests and  Data Reviewed:    EKG:  None today.  Recent Labs: 05/26/2019: BUN 32; Creatinine, Ser 1.15; Potassium 4.6; Sodium 140   Recent Lipid Panel No results found for: CHOL, TRIG, HDL, CHOLHDL, LDLCALC, LDLDIRECT  Wt Readings from Last 3 Encounters:  06/05/19 155 lb (70.3 kg)  05/05/19 156 lb 9.6 oz (71 kg)  03/03/19 158 lb (71.7 kg)     Objective:    Vital Signs:  BP (!) 142/63   Pulse (!) 53   Ht 5\' 7"  (1.702 m)   Wt 155 lb (70.3 kg)   SpO2 97%   BMI 24.28 kg/m     ASSESSMENT & PLAN:    We will continue the patient on her current regimen. Her bp target will be 140/90 as she reports significant lightheadedness with systolic bp in the 563J.  I review her recent blood work with her. She tells me that she has has chronic kidney  disease. We will continue to monitor her kidney function on Aldactone. No changes in her medication today.   COVID-19 Education: The signs and symptoms of COVID-19 were discussed with the patient and how to seek care for testing (follow up with PCP or arrange E-visit).  The importance of social distancing was discussed today.  Time:   Today, I have spent  10 minutes with the patient with telehealth technology discussing the above problems.     Medication Adjustments/Labs and Tests Ordered: Current medicines are reviewed at length with the patient today.  Concerns regarding medicines are outlined above.   Tests Ordered: No orders of the defined types were placed in this encounter.   Medication Changes: Meds ordered this encounter  Medications  . carvedilol (COREG) 3.125 MG tablet    Sig: Take 1 tablet (3.125 mg total) by mouth 2 (two) times daily.    Dispense:  180 tablet    Refill:  1  . spironolactone (ALDACTONE) 25 MG tablet    Sig: Take 0.5 tablets (12.5 mg total) by mouth daily.    Dispense:  45 tablet    Refill:  1    Follow Up: 3 months.  Rolly Pancake, DO  06/05/2019 1:38 PM    [facility]

## 2019-06-11 ENCOUNTER — Telehealth: Payer: Self-pay | Admitting: Cardiology

## 2019-06-11 NOTE — Telephone Encounter (Signed)
FYI

## 2019-06-11 NOTE — Telephone Encounter (Signed)
New Message    Pt c/o BP issue: STAT if pt c/o blurred vision, one-sided weakness or slurred speech  1. What are your last 5 BP readings? Yesterday 112/53, 115/59   2. Are you having any other symptoms (ex. Dizziness, headache, blurred vision, passed out)? Pt had Dizziness yesterday   3. What is your BP issue? Pt is calling and says she had low BP readings yesterday and felt dizziness. She states today it is 141/77 and is going to skip her dose of  spironolactone (ALDACTONE) 25 MG tablet   Please advise

## 2019-06-12 NOTE — Progress Notes (Signed)
This was a telephone visit and the patient consented to this visit.

## 2019-06-12 NOTE — Telephone Encounter (Signed)
Thank you :)

## 2019-06-16 DIAGNOSIS — Z7901 Long term (current) use of anticoagulants: Secondary | ICD-10-CM | POA: Diagnosis not present

## 2019-06-20 DIAGNOSIS — G250 Essential tremor: Secondary | ICD-10-CM | POA: Diagnosis not present

## 2019-06-20 DIAGNOSIS — I5030 Unspecified diastolic (congestive) heart failure: Secondary | ICD-10-CM | POA: Diagnosis not present

## 2019-06-20 DIAGNOSIS — E782 Mixed hyperlipidemia: Secondary | ICD-10-CM | POA: Diagnosis not present

## 2019-06-20 DIAGNOSIS — Z8673 Personal history of transient ischemic attack (TIA), and cerebral infarction without residual deficits: Secondary | ICD-10-CM | POA: Diagnosis not present

## 2019-07-28 DIAGNOSIS — Z7901 Long term (current) use of anticoagulants: Secondary | ICD-10-CM | POA: Diagnosis not present

## 2019-08-08 DIAGNOSIS — I340 Nonrheumatic mitral (valve) insufficiency: Secondary | ICD-10-CM | POA: Diagnosis not present

## 2019-08-08 DIAGNOSIS — R200 Anesthesia of skin: Secondary | ICD-10-CM | POA: Diagnosis not present

## 2019-08-08 DIAGNOSIS — J9811 Atelectasis: Secondary | ICD-10-CM | POA: Diagnosis not present

## 2019-08-08 DIAGNOSIS — I352 Nonrheumatic aortic (valve) stenosis with insufficiency: Secondary | ICD-10-CM | POA: Diagnosis not present

## 2019-08-08 DIAGNOSIS — R531 Weakness: Secondary | ICD-10-CM | POA: Diagnosis not present

## 2019-08-08 DIAGNOSIS — G459 Transient cerebral ischemic attack, unspecified: Secondary | ICD-10-CM | POA: Diagnosis not present

## 2019-08-08 DIAGNOSIS — I361 Nonrheumatic tricuspid (valve) insufficiency: Secondary | ICD-10-CM | POA: Diagnosis not present

## 2019-08-08 DIAGNOSIS — I6603 Occlusion and stenosis of bilateral middle cerebral arteries: Secondary | ICD-10-CM | POA: Diagnosis not present

## 2019-08-08 DIAGNOSIS — I6523 Occlusion and stenosis of bilateral carotid arteries: Secondary | ICD-10-CM | POA: Diagnosis not present

## 2019-08-08 DIAGNOSIS — I10 Essential (primary) hypertension: Secondary | ICD-10-CM | POA: Diagnosis not present

## 2019-08-09 DIAGNOSIS — I10 Essential (primary) hypertension: Secondary | ICD-10-CM | POA: Diagnosis present

## 2019-08-09 DIAGNOSIS — E785 Hyperlipidemia, unspecified: Secondary | ICD-10-CM | POA: Diagnosis not present

## 2019-08-09 DIAGNOSIS — I639 Cerebral infarction, unspecified: Secondary | ICD-10-CM | POA: Diagnosis present

## 2019-08-09 DIAGNOSIS — G459 Transient cerebral ischemic attack, unspecified: Secondary | ICD-10-CM | POA: Diagnosis not present

## 2019-08-09 DIAGNOSIS — I6523 Occlusion and stenosis of bilateral carotid arteries: Secondary | ICD-10-CM | POA: Diagnosis not present

## 2019-08-09 DIAGNOSIS — R200 Anesthesia of skin: Secondary | ICD-10-CM | POA: Diagnosis not present

## 2019-08-09 DIAGNOSIS — Z8673 Personal history of transient ischemic attack (TIA), and cerebral infarction without residual deficits: Secondary | ICD-10-CM | POA: Diagnosis not present

## 2019-08-09 DIAGNOSIS — R29703 NIHSS score 3: Secondary | ICD-10-CM | POA: Diagnosis present

## 2019-08-09 DIAGNOSIS — R531 Weakness: Secondary | ICD-10-CM | POA: Diagnosis present

## 2019-08-09 DIAGNOSIS — Z79899 Other long term (current) drug therapy: Secondary | ICD-10-CM | POA: Diagnosis not present

## 2019-08-09 DIAGNOSIS — N1831 Chronic kidney disease, stage 3a: Secondary | ICD-10-CM | POA: Diagnosis present

## 2019-08-09 DIAGNOSIS — Z7901 Long term (current) use of anticoagulants: Secondary | ICD-10-CM | POA: Diagnosis not present

## 2019-08-09 DIAGNOSIS — R29700 NIHSS score 0: Secondary | ICD-10-CM | POA: Diagnosis present

## 2019-08-09 DIAGNOSIS — Z86718 Personal history of other venous thrombosis and embolism: Secondary | ICD-10-CM | POA: Diagnosis not present

## 2019-08-09 DIAGNOSIS — J9811 Atelectasis: Secondary | ICD-10-CM | POA: Diagnosis not present

## 2019-08-09 DIAGNOSIS — N183 Chronic kidney disease, stage 3 unspecified: Secondary | ICD-10-CM | POA: Diagnosis not present

## 2019-08-09 DIAGNOSIS — I129 Hypertensive chronic kidney disease with stage 1 through stage 4 chronic kidney disease, or unspecified chronic kidney disease: Secondary | ICD-10-CM | POA: Diagnosis not present

## 2019-08-10 DIAGNOSIS — I10 Essential (primary) hypertension: Secondary | ICD-10-CM | POA: Diagnosis not present

## 2019-08-10 DIAGNOSIS — G459 Transient cerebral ischemic attack, unspecified: Secondary | ICD-10-CM | POA: Diagnosis not present

## 2019-08-11 ENCOUNTER — Other Ambulatory Visit: Payer: Self-pay | Admitting: Cardiology

## 2019-08-11 DIAGNOSIS — R42 Dizziness and giddiness: Secondary | ICD-10-CM | POA: Diagnosis not present

## 2019-08-11 DIAGNOSIS — E782 Mixed hyperlipidemia: Secondary | ICD-10-CM | POA: Diagnosis not present

## 2019-08-11 DIAGNOSIS — E785 Hyperlipidemia, unspecified: Secondary | ICD-10-CM | POA: Diagnosis not present

## 2019-08-11 DIAGNOSIS — I639 Cerebral infarction, unspecified: Secondary | ICD-10-CM | POA: Diagnosis not present

## 2019-08-11 DIAGNOSIS — I10 Essential (primary) hypertension: Secondary | ICD-10-CM | POA: Diagnosis not present

## 2019-08-11 DIAGNOSIS — I6389 Other cerebral infarction: Secondary | ICD-10-CM

## 2019-08-11 DIAGNOSIS — I119 Hypertensive heart disease without heart failure: Secondary | ICD-10-CM | POA: Diagnosis not present

## 2019-08-11 DIAGNOSIS — Z8673 Personal history of transient ischemic attack (TIA), and cerebral infarction without residual deficits: Secondary | ICD-10-CM | POA: Diagnosis not present

## 2019-08-11 DIAGNOSIS — N1831 Chronic kidney disease, stage 3a: Secondary | ICD-10-CM | POA: Diagnosis not present

## 2019-08-11 DIAGNOSIS — N183 Chronic kidney disease, stage 3 unspecified: Secondary | ICD-10-CM | POA: Diagnosis not present

## 2019-08-11 DIAGNOSIS — I350 Nonrheumatic aortic (valve) stenosis: Secondary | ICD-10-CM | POA: Diagnosis not present

## 2019-08-11 DIAGNOSIS — I351 Nonrheumatic aortic (valve) insufficiency: Secondary | ICD-10-CM | POA: Diagnosis not present

## 2019-08-11 DIAGNOSIS — R262 Difficulty in walking, not elsewhere classified: Secondary | ICD-10-CM | POA: Diagnosis not present

## 2019-08-11 DIAGNOSIS — Z86718 Personal history of other venous thrombosis and embolism: Secondary | ICD-10-CM | POA: Diagnosis not present

## 2019-08-11 DIAGNOSIS — G459 Transient cerebral ischemic attack, unspecified: Secondary | ICD-10-CM | POA: Diagnosis not present

## 2019-08-13 ENCOUNTER — Telehealth: Payer: Self-pay | Admitting: *Deleted

## 2019-08-13 ENCOUNTER — Encounter: Payer: Self-pay | Admitting: *Deleted

## 2019-08-13 DIAGNOSIS — E785 Hyperlipidemia, unspecified: Secondary | ICD-10-CM | POA: Diagnosis not present

## 2019-08-13 DIAGNOSIS — R262 Difficulty in walking, not elsewhere classified: Secondary | ICD-10-CM | POA: Diagnosis not present

## 2019-08-13 DIAGNOSIS — I639 Cerebral infarction, unspecified: Secondary | ICD-10-CM | POA: Diagnosis not present

## 2019-08-13 DIAGNOSIS — I119 Hypertensive heart disease without heart failure: Secondary | ICD-10-CM | POA: Diagnosis not present

## 2019-08-13 NOTE — Telephone Encounter (Signed)
Patient enrolled for [employer] to ship a 30 day cardiac event monitor to her home.  Instructions will be mailed to her home and will also be included in her monitor kit.

## 2019-08-25 ENCOUNTER — Encounter: Payer: Self-pay | Admitting: Cardiology

## 2019-08-25 ENCOUNTER — Ambulatory Visit (INDEPENDENT_AMBULATORY_CARE_PROVIDER_SITE_OTHER): Payer: Medicare Other | Admitting: Cardiology

## 2019-08-25 ENCOUNTER — Other Ambulatory Visit: Payer: Self-pay

## 2019-08-25 ENCOUNTER — Ambulatory Visit (INDEPENDENT_AMBULATORY_CARE_PROVIDER_SITE_OTHER): Payer: Medicare Other

## 2019-08-25 VITALS — BP 140/70 | Ht 67.0 in | Wt 157.0 lb

## 2019-08-25 DIAGNOSIS — Z8673 Personal history of transient ischemic attack (TIA), and cerebral infarction without residual deficits: Secondary | ICD-10-CM

## 2019-08-25 DIAGNOSIS — I10 Essential (primary) hypertension: Secondary | ICD-10-CM | POA: Diagnosis not present

## 2019-08-25 DIAGNOSIS — R42 Dizziness and giddiness: Secondary | ICD-10-CM | POA: Diagnosis not present

## 2019-08-25 DIAGNOSIS — N1831 Chronic kidney disease, stage 3a: Secondary | ICD-10-CM | POA: Diagnosis not present

## 2019-08-25 DIAGNOSIS — E782 Mixed hyperlipidemia: Secondary | ICD-10-CM | POA: Diagnosis not present

## 2019-08-25 DIAGNOSIS — I350 Nonrheumatic aortic (valve) stenosis: Secondary | ICD-10-CM | POA: Diagnosis not present

## 2019-08-25 DIAGNOSIS — I351 Nonrheumatic aortic (valve) insufficiency: Secondary | ICD-10-CM | POA: Diagnosis not present

## 2019-08-25 NOTE — Patient Instructions (Signed)
Medication Instructions:  Your physician recommends that you continue on your current medications as directed. Please refer to the Current Medication list given to you today.  *If you need a refill on your cardiac medications before your next appointment, please call your pharmacy*   Lab Work: None If you have labs (blood work) drawn today and your tests are completely normal, you will receive your results only by: . MyChart Message (if you have MyChart) OR . A paper copy in the mail If you have any lab test that is abnormal or we need to change your treatment, we will call you to review the results.   Testing/Procedures: A zio monitor was ordered today. It will remain on for 14 days. You will then return monitor and event diary in provided box. It takes 1-2 weeks for report to be downloaded and returned to us. We will call you with the results. If monitor falls off or has orange flashing light, please call Zio for further instructions.    Follow-Up: At [facility], you and your health needs are our priority.  As part of our continuing mission to provide you with exceptional heart care, we have created designated Provider Care Teams.  These Care Teams include your primary Cardiologist (physician) and Advanced Practice Providers (APPs -  Physician Assistants and Nurse Practitioners) who all work together to provide you with the care you need, when you need it.  We recommend signing up for the patient portal called "MyChart".  Sign up information is provided on this After Visit Summary.  MyChart is used to connect with patients for Virtual Visits (Telemedicine).  Patients are able to view lab/test results, encounter notes, upcoming appointments, etc.  Non-urgent messages can be sent to your provider as well.   To learn more about what you can do with MyChart, go to [email or web address].    Your next appointment:   8 week(s)  The format for your next appointment:   In  Person  Provider:   Kardie Tobb, DO   Other Instructions   

## 2019-08-25 NOTE — Progress Notes (Signed)
Cardiology Office Note:    Date:  08/25/2019   ID:  Roberta Mercer, DOB January 07, 1941, MRN [id number]  PCP:  Lise Auer, MD  Cardiologist:  Thomasene Ripple, DO  Electrophysiologist:  None   Referring MD: Lise Auer, MD   Chief Complaint  Patient presents with  . Follow-up  . 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   I initially saw the patient on February 11, 2019 she reported that she was experiencing shortness of breath on exertion. Her physical exam did have evidence of bilateral crackles as well as ankle edema. There is also murmur which I do suspect valvular heart disease was contributing. Therefore started the patient on Aldactone 12.5 mg 3 times a week in order transthoracic echocardiogram. In the interim she follow-up due to elevated blood pressure. At that time I started her on nadolol as she had taken his medication in the past but unfortunately her insurance company could not pay for this medication therefore we changed it to carvedilol 3.125 mg daily.   She was seen on 05/05/2019 at that time she was still hypertensive - she was still short of breath. With her history of diastolic dysfunction, I increase her Aldactone to 12.5 mg daily from 3x a week.She was then continue on her coreg as well .  I saw the patient on June 05, 2019 at that time no changes were made to her medication and she did tell me that less than 140/90 she experienced significant lightheadedness and dizziness.   In the interim the patient was admitted to [facility] due to lower extremity weakness and slurred speech.  MRI did show acute left internal capsule acute infarct.  Aspirin was stopped she was started on Plavix.  She was then sent to rehab.  She is here  for follow-up visit.  She is currently at the rehab facility.  She is excited because she will be going home in the next couple days.    Past Medical History:  Diagnosis Date  . Esophageal spasm   . Essential tremor   . History of stroke 11/14/2014  . History of venous thromboembolism   . Hyperlipidemia   . Post-menopausal atrophic vaginitis   . TIA (transient ischemic attack)     Past Surgical History:  Procedure Laterality Date  . CATARACT EXTRACTION    . ELBOW SURGERY Left    to repair nerve/ not successful  . TRIGGER FINGER RELEASE      Current Medications: Current Meds  Medication Sig  . amLODipine (NORVASC) 5 MG tablet Take 5 mg by mouth daily.  . Calcium Carb-Cholecalciferol (CALCIUM 600 + D PO) Take by mouth daily.  . carvedilol (COREG) 3.125 MG tablet Take 1 tablet (3.125 mg total) by mouth 2 (two) times daily.  . cholecalciferol (VITAMIN D3) 25 MCG (1000 UT) tablet Take 1,000 Units by mouth daily.  . clopidogrel (PLAVIX) 75 MG tablet Take 75 mg by mouth daily.  . cyanocobalamin 1000 MCG tablet Take 1,000 mcg by mouth daily.  . fluticasone (FLONASE) 50 MCG/ACT nasal spray Place 1 spray into both nostrils at bedtime.  . folic acid (FOLVITE) 1 MG tablet Take 1 mg by mouth daily.  Marland Kitchen loratadine (CLARITIN) 10 MG tablet Take 10 mg by mouth daily.  . Magnesium 250 MG TABS Take 250  mg by mouth daily.  . magnesium hydroxide (MILK OF MAGNESIA) 400 MG/5ML suspension Take 30 mLs by mouth daily as needed for mild constipation.  . Omega-3 Fatty Acids (FISH OIL) 1000 MG CAPS Take 1,000 mg by mouth daily.  Marland Kitchen pyridOXINE (VITAMIN B-6) 100 MG tablet Take 100 mg by mouth daily.  . rosuvastatin (CRESTOR) 10 MG tablet Take 10 mg by mouth every other day.   . thiamine 100 MG tablet Take 100 mg by mouth daily.  Marland Kitchen topiramate (TOPAMAX) 25 MG tablet 25 mg daily. Take 1-2 daily for tremors  . warfarin (COUMADIN) 4 MG tablet Take 4 mg by mouth daily.  . [DISCONTINUED] warfarin (COUMADIN) 2.5  MG tablet Take 2.5 mg by mouth daily.     Allergies:   Bactrim [sulfamethoxazole-trimethoprim] and Diovan [valsartan]   Social History   Socioeconomic History  . Marital status: Single    Spouse name: Not on file  . Number of children: Not on file  . Years of education: Not on file  . Highest education level: Not on file  Occupational History  . Not on file  Tobacco Use  . Smoking status: Former Smoker    Quit date: 1976    Years since quitting: 45.3  . Smokeless tobacco: Never Used  Substance and Sexual Activity  . Alcohol use: Never  . Drug use: Never  . Sexual activity: Not on file  Other Topics Concern  . Not on file  Social History Narrative  . Not on file   Social Determinants of Health   Financial Resource Strain:   . Difficulty of Paying Living Expenses:   Food Insecurity:   . Worried About Charity fundraiser in the Last Year:   . Arboriculturist in the Last Year:   Transportation Needs:   . Film/video editor (Medical):   Marland Kitchen Lack of Transportation (Non-Medical):   Physical Activity:   . Days of Exercise per Week:   . Minutes of Exercise per Session:   Stress:   . Feeling of Stress :   Social Connections:   . Frequency of Communication with Friends and Family:   . Frequency of Social Gatherings with Friends and Family:   . Attends Religious Services:   . Active Member of Clubs or Organizations:   . Attends Archivist Meetings:   Marland Kitchen Marital Status:      Family History: The patient's family history includes Heart disease in her mother; Vascular Disease in her maternal grandmother.  ROS:   Review of Systems  Constitution: Negative for decreased appetite, fever and weight gain.  HENT: Negative for congestion, ear discharge, hoarse voice and sore throat.   Eyes: Negative for discharge, redness, vision loss in right eye and visual halos.  Cardiovascular: Negative for chest pain, dyspnea on exertion, leg swelling, orthopnea and palpitations.    Respiratory: Negative for cough, hemoptysis, shortness of breath and snoring.   Endocrine: Negative for heat intolerance and polyphagia.  Hematologic/Lymphatic: Negative for bleeding problem. Does not bruise/bleed easily.  Skin: Negative for flushing, nail changes, rash and suspicious lesions.  Musculoskeletal: Negative for arthritis, joint pain, muscle cramps, myalgias, neck pain and stiffness.  Gastrointestinal: Negative for abdominal pain, bowel incontinence, diarrhea and excessive appetite.  Genitourinary: Negative for decreased libido, genital sores and incomplete emptying.  Neurological: Negative for brief paralysis, focal weakness, headaches and loss of balance.  Psychiatric/Behavioral: Negative for altered mental status, depression and suicidal ideas.  Allergic/Immunologic: Negative for HIV exposure and persistent  infections.    EKGs/Labs/Other Studies Reviewed:    The following studies were reviewed today:   EKG: None today  Bilateral carotid artery duplex shows moderate to large amount of bilateral atherosclerotic plaque, left greater than right not resulting in hemodynamically significant stenosis within either internal carotid artery.  Transthoracic echocardiogram done on August 08, 2019 there was mild concentric left hypertrophy.  Overall left ventricular systolic function 60 to 65%.  Impaired relaxation.  Mild to moderate aortic regurgitation.  Mild aortic stenosis.  AVA by VTI 9.5 cm, mean gradient 22 mmHg.  Mild mitral regurgitation.  Mild to moderate tricuspid regurgitation.  Mild pulmonary hypertension   Recent Labs: 05/26/2019: BUN 32; Creatinine, Ser 1.15; Potassium 4.6; Sodium 140  Recent Lipid Panel No results found for: CHOL, TRIG, HDL, CHOLHDL, VLDL, LDLCALC, LDLDIRECT  Physical Exam:    VS:  BP 140/70 (BP Location: Right Arm, Patient Position: Sitting, Cuff Size: Normal)   Ht 5\' 7"  (1.702 m)   Wt 157 lb (71.2 kg)   BMI 24.59 kg/m     Wt Readings from  Last 3 Encounters:  08/25/19 157 lb (71.2 kg)  06/05/19 155 lb (70.3 kg)  05/05/19 156 lb 9.6 oz (71 kg)     GEN: Slurred speech, well nourished, well developed in no acute distress HEENT: Normal, NECK: No JVD; No carotid bruits LYMPHATICS: No lymphadenopathy CARDIAC: S1S2 noted,RRR, no murmurs, rubs, gallops RESPIRATORY:  Clear to auscultation without rales, wheezing or rhonchi  ABDOMEN: Soft, non-tender, non-distended, +bowel sounds, no guarding. EXTREMITIES: No edema, No cyanosis, no clubbing MUSCULOSKELETAL:  No deformity  SKIN: Warm and dry NEUROLOGIC: Mild right facial droop, alert and oriented x 3, non-focal PSYCHIATRIC:  Normal affect, good insight  ASSESSMENT:    1. Dizziness   2. Essential hypertension   3. Nonrheumatic aortic valve insufficiency   4. Nonrheumatic aortic valve stenosis   5. Stage 3a chronic kidney disease   6. Mixed hyperlipidemia   7. History of stroke    PLAN:     Physically she is improving with her physical therapy.  She is in a wheelchair today but she notes that she has been able to walk around at the skilled nursing facility.  She has been cleared to go home.  Unfortunately she lives by herself and I am concerned about this but she tells me that she was told she should be fine.  She is now on Plavix along with her Coumadin (INR goal 2-3).  I reviewed her echocardiogram as well as her bilateral carotid Doppler testing that was done while she was in the hospital.  In light of her current stroke which still is somewhat cryptogenic-I will like to place 30-day event monitor on the patient but she prefers shorter duration at this time given her transition from the SNF facility soon to home.  Healing ideally she would be a candidate for a loop recorder possibly.  1 makes understanding if this patient have atrial fibrillation important not be possibly changing her INR target from 2-3 to 2.5-3.5.  Her blood pressure today manually 140/70 mmHg.  She notes  that anything lower than days she gets dizzy at home.  Since she is going to be home alone I would like to avoid orthostatic hypotension in this patient.  Hyperlipidemia continue patient her statin medication.   The patient is in agreement with the above plan. The patient left the office in stable condition.  The patient will follow up in 8 weeks or sooner if needed.  Medication Adjustments/Labs and Tests Ordered: Current medicines are reviewed at length with the patient today.  Concerns regarding medicines are outlined above.  Orders Placed This Encounter  Procedures  . LONG TERM MONITOR (3-14 DAYS)   No orders of the defined types were placed in this encounter.   Patient Instructions  Medication Instructions:  Your physician recommends that you continue on your current medications as directed. Please refer to the Current Medication list given to you today.  *If you need a refill on your cardiac medications before your next appointment, please call your pharmacy*   Lab Work: None If you have labs (blood work) drawn today and your tests are completely normal, you will receive your results only by: Marland Kitchen MyChart Message (if you have MyChart) OR . A paper copy in the mail If you have any lab test that is abnormal or we need to change your treatment, we will call you to review the results.   Testing/Procedures: A zio monitor was ordered today. It will remain on for 14 days. You will then return monitor and event diary in provided box. It takes 1-2 weeks for report to be downloaded and returned to [place]. We will call you with the results. If monitor falls off or has orange flashing light, please call Zio for further instructions.      Follow-Up: At [facility], you and your health needs are our priority.  As part of our continuing mission to provide you with exceptional heart care, we have created designated Provider Care Teams.  These Care Teams include your primary Cardiologist  (physician) and Advanced Practice Providers (APPs -  Physician Assistants and Nurse Practitioners) who all work together to provide you with the care you need, when you need it.  We recommend signing up for the patient portal called "MyChart".  Sign up information is provided on this After Visit Summary.  MyChart is used to connect with patients for Virtual Visits (Telemedicine).  Patients are able to view lab/test results, encounter notes, upcoming appointments, etc.  Non-urgent messages can be sent to your provider as well.   To learn more about what you can do with MyChart, go to [email or web address].    Your next appointment:   8 week(s)  The format for your next appointment:   In Person  Provider:   Thomasene Ripple, DO   Other Instructions      Adopting a Healthy Lifestyle.  Know what a healthy weight is for you (roughly BMI <25) and aim to maintain this   Aim for 7+ servings of fruits and vegetables daily   65-80+ fluid ounces of water or unsweet tea for healthy kidneys   Limit to max 1 drink of alcohol per day; avoid smoking/tobacco   Limit animal fats in diet for cholesterol and heart health - choose grass fed whenever available   Avoid highly processed foods, and foods high in saturated/trans fats   Aim for low stress - take time to unwind and care for your mental health   Aim for 150 min of moderate intensity exercise weekly for heart health, and weights twice weekly for bone health   Aim for 7-9 hours of sleep daily   When it comes to diets, agreement about the perfect plan isnt easy to find, even among the experts. Experts at the [facility] [employer] developed an idea known as the Healthy Eating Plate. Just imagine a plate divided into logical, healthy portions.   The emphasis is on diet quality:  Load up on vegetables and fruits - one-half of your plate: Aim for color and variety, and remember that potatoes dont count.   Go for whole grains -  one-quarter of your plate: Whole wheat, barley, wheat berries, quinoa, oats, brown rice, and foods made with them. If you want pasta, go with whole wheat pasta.   Protein power - one-quarter of your plate: Fish, chicken, beans, and nuts are all healthy, versatile protein sources. Limit red meat.   The diet, however, does go beyond the plate, offering a few other suggestions.   Use healthy plant oils, such as olive, canola, soy, corn, sunflower and peanut. Check the labels, and avoid partially hydrogenated oil, which have unhealthy trans fats.   If youre thirsty, drink water. Coffee and tea are good in moderation, but skip sugary drinks and limit milk and dairy products to one or two daily servings.   The type of carbohydrate in the diet is more important than the amount. Some sources of carbohydrates, such as vegetables, fruits, whole grains, and beans-are healthier than others.   Finally, stay active  Signed, Thomasene Ripple, DO  08/25/2019 9:33 PM    [facility]

## 2019-08-29 ENCOUNTER — Other Ambulatory Visit: Payer: Self-pay | Admitting: *Deleted

## 2019-08-29 DIAGNOSIS — I10 Essential (primary) hypertension: Secondary | ICD-10-CM

## 2019-08-29 NOTE — Patient Outreach (Signed)
Member screened for potential [facility] Care Management needs as a benefit of [employer].  Verified in Patient Roberta Mercer that Roberta Mercer transitioned to home from [facility] SNF on 08/28/19.  Telephone call made to Roberta Mercer [phone]. Patient identifiers confirmed. Roberta Mercer confirms she discharged from SNF on yesterday. Reports she did not need home health. She lives alone. States her friends assist with transportation. Denies need for [facility] meals. Confirmed PCP appointment with Dr. Welton Flakes is next Tuesday, May 18th.   Explained [facility] Care Management program services. She is agreeable. Confirms best contact number is home phone [phone].   Will make referral to [facility] Care Management RNCM. Roberta Mercer has a medical history DVT, CVA, HTN, HLD, CKD III.   Roberta Noble, MSN-Ed, RN,BSN [facility] Acute Care Coordinator [phone] [facility]) [phone]  (Toll free office)

## 2019-09-01 ENCOUNTER — Other Ambulatory Visit: Payer: Self-pay

## 2019-09-01 NOTE — Patient Outreach (Signed)
[facility]) Care Management  09/01/2019  Roberta Mercer 03-Aug-1940 [id number]   New referral for transition of care. Discharged from [facility]. Date of discharge on 08/28/2019 Referral received today.  Reviewed EMR from [facility]. Admitted on 08/08/2019  With ischemic stroke. Lives alone. Started on Plavix and amlodipine. Follow up with neurology in 4 weeks. Discharged from [place] on 08/11/2019  Placed call to patient with no answer.Left a message requesting a call back.  PLAN: will send outreach letter and attempt another telephone outreach in 3 days.   [name], RN, BSN, CEN [facility] [employer] [phone]

## 2019-09-02 DIAGNOSIS — Z6824 Body mass index (BMI) 24.0-24.9, adult: Secondary | ICD-10-CM | POA: Diagnosis not present

## 2019-09-02 DIAGNOSIS — Z7901 Long term (current) use of anticoagulants: Secondary | ICD-10-CM | POA: Diagnosis not present

## 2019-09-02 DIAGNOSIS — L89159 Pressure ulcer of sacral region, unspecified stage: Secondary | ICD-10-CM | POA: Diagnosis not present

## 2019-09-02 DIAGNOSIS — Z8673 Personal history of transient ischemic attack (TIA), and cerebral infarction without residual deficits: Secondary | ICD-10-CM | POA: Diagnosis not present

## 2019-09-03 ENCOUNTER — Telehealth: Payer: Self-pay | Admitting: *Deleted

## 2019-09-03 NOTE — Telephone Encounter (Signed)
Called pt to find out what happened to the 30 Day Event monitor mailed to her from [employer]. The pt stated that she had mailed it back to the company and I thanked her.

## 2019-09-04 ENCOUNTER — Other Ambulatory Visit: Payer: Self-pay

## 2019-09-04 ENCOUNTER — Ambulatory Visit: Payer: Medicare Other | Admitting: Neurology

## 2019-09-04 DIAGNOSIS — I639 Cerebral infarction, unspecified: Secondary | ICD-10-CM | POA: Diagnosis not present

## 2019-09-05 NOTE — Patient Outreach (Signed)
[facility]) Care Management  09/05/2019  Roberta Mercer 1941-03-21 [id number]   Transition of care: Placed call to patient with no answer. Left a message requesting a call back.  PLAN: will attempt 3rd outreach in 3 days.  [name], RN, BSN, CEN [facility] [employer] [phone]

## 2019-09-09 ENCOUNTER — Telehealth: Payer: Self-pay | Admitting: Cardiology

## 2019-09-09 NOTE — Telephone Encounter (Signed)
Spoke to the patient just now and let her know Dr. Mallory Shirk recommendation to cut back on her Amlodipine. She verbalizes understanding and states that she will do this. No other issues or concerns were noted at this time.    Encouraged patient to call back with any questions or concerns.

## 2019-09-09 NOTE — Telephone Encounter (Signed)
Please asked the patient to cut back on amlodipine from 5 mg daily to 2.5 mg daily.

## 2019-09-09 NOTE — Telephone Encounter (Signed)
Pt c/o BP issue: STAT if pt c/o blurred vision, one-sided weakness or slurred speech  1. What are your last 5 BP readings? Sitting 150/64 and standing 100/60  2. Are you having any other symptoms (ex. Dizziness, headache, blurred vision, passed out)? Dizziness while standing over the weekend. Misty Stanley states that they are sending back the patients heart monitor today and she does report two dizzy spells while wearing the heart monitor as well.   3. What is your BP issue? BP dropping while standing.   Please contact patient at [phone]

## 2019-09-10 ENCOUNTER — Other Ambulatory Visit: Payer: Self-pay

## 2019-09-10 NOTE — Patient Outreach (Signed)
[facility]) Care Management  09/10/2019  Roberta Mercer 02/14/1941  [id number]    Transition of care: 3rd attempt to reach patient that was unsuccessful. No response yet from letter mailed.  PLAN: will continue to await a response from calls and or letter. If no response with plan to close case on September 16 2019  [name], RN, BSN, CEN [facility] [employer] [phone]

## 2019-09-12 DIAGNOSIS — Z7901 Long term (current) use of anticoagulants: Secondary | ICD-10-CM | POA: Diagnosis not present

## 2019-09-16 ENCOUNTER — Telehealth: Payer: Self-pay | Admitting: Cardiology

## 2019-09-16 ENCOUNTER — Other Ambulatory Visit: Payer: Self-pay

## 2019-09-16 NOTE — Telephone Encounter (Signed)
° °  Pt c/o medication issue:  1. Name of Medication:   amLODipine (NORVASC) 5 MG tablet    2. How are you currently taking this medication (dosage and times per day)?   3. Are you having a reaction (difficulty breathing--STAT)?   4. What is your medication issue? Pt called she said she wanted to give Dr. Servando Salina a proposal. If her systolic BP is less than 140 she will not take her amlodipine but will still drink carvedilol  Please advise

## 2019-09-16 NOTE — Patient Outreach (Signed)
[facility]) Care Management  09/16/2019  Hoa Briggs 03-07-1941 [id number]   Case closure: Unable to reach patient after 3 attempts and no response to outreach letter.  PLAN: case closed as unable to reach.  [name], RN, BSN, CEN [facility] [employer] [phone]

## 2019-09-17 DIAGNOSIS — L89152 Pressure ulcer of sacral region, stage 2: Secondary | ICD-10-CM | POA: Diagnosis not present

## 2019-09-17 NOTE — Telephone Encounter (Signed)
That would be okay

## 2019-09-17 NOTE — Telephone Encounter (Signed)
Spoke with the patient just now and let her know that Dr. Servando Salina said that she would be fine for her to only take her amlodipine if her BP was over 140 systolic. She verbalizes understanding and does not have any other issues or concerns at this time. I advised that she should just keep a close eye on her BP while doing this. She states that she will.   Encouraged patient to call back with any questions or concerns.

## 2019-09-23 ENCOUNTER — Telehealth: Payer: Self-pay | Admitting: Cardiology

## 2019-09-23 NOTE — Telephone Encounter (Signed)
STAT if HR is under 50 or over 120 (normal HR is 60-100 beats per minute)  1) What is your heart rate? 58-60 today  2) Do you have a log of your heart rate readings (document readings)? 58-60 today  3) Do you have any other symptoms? no   Stacey from [facility] calling stating the patient's HR has been low. She states the patient was told she can skip her amlodipine on days her BP is 140. She states she has not taken it this month her BP is good. She states is was BP 132/62 today sitting, and standing was 110/60. She would like the patient to receive a call back.

## 2019-09-24 DIAGNOSIS — R42 Dizziness and giddiness: Secondary | ICD-10-CM | POA: Diagnosis not present

## 2019-09-24 NOTE — Telephone Encounter (Signed)
Heart rate is fine as long as she is not dizzy.

## 2019-09-24 NOTE — Telephone Encounter (Signed)
Called patient back. Informed her per Dr. Servando Salina that her heart rate at 58-60 was ok as long as she was not symptomatic. She confirmed she was not, she denied any dizziness, palpitations or chest pain. She will let us know if anything changes or gets worse. No further questions.

## 2019-09-24 NOTE — Telephone Encounter (Signed)
Follow up  ° ° °Pt is returning call  ° ° °Please call back  °

## 2019-09-24 NOTE — Telephone Encounter (Signed)
Left message for patient to return call.

## 2019-09-26 DIAGNOSIS — Z7901 Long term (current) use of anticoagulants: Secondary | ICD-10-CM | POA: Diagnosis not present

## 2019-09-30 ENCOUNTER — Telehealth: Payer: Self-pay

## 2019-09-30 NOTE — Telephone Encounter (Signed)
Spoke with patient regarding results and recommendation.  Patient verbalizes understanding and is agreeable to plan of care. Advised patient to call back with any issues or concerns.  

## 2019-09-30 NOTE — Telephone Encounter (Signed)
-----   Message from Thomasene Ripple, DO sent at 09/29/2019 11:28 PM EDT ----- The monitor showed that you are having fast beats from the top of the heart. At your visit on 7/6 we can discuss treatment options.

## 2019-10-04 DIAGNOSIS — I69322 Dysarthria following cerebral infarction: Secondary | ICD-10-CM | POA: Diagnosis not present

## 2019-10-04 DIAGNOSIS — I69351 Hemiplegia and hemiparesis following cerebral infarction affecting right dominant side: Secondary | ICD-10-CM | POA: Diagnosis not present

## 2019-10-04 DIAGNOSIS — K224 Dyskinesia of esophagus: Secondary | ICD-10-CM | POA: Diagnosis not present

## 2019-10-04 DIAGNOSIS — R1310 Dysphagia, unspecified: Secondary | ICD-10-CM | POA: Diagnosis not present

## 2019-10-04 DIAGNOSIS — I69398 Other sequelae of cerebral infarction: Secondary | ICD-10-CM | POA: Diagnosis not present

## 2019-10-04 DIAGNOSIS — H53462 Homonymous bilateral field defects, left side: Secondary | ICD-10-CM | POA: Diagnosis not present

## 2019-10-04 DIAGNOSIS — I351 Nonrheumatic aortic (valve) insufficiency: Secondary | ICD-10-CM | POA: Diagnosis not present

## 2019-10-04 DIAGNOSIS — I130 Hypertensive heart and chronic kidney disease with heart failure and stage 1 through stage 4 chronic kidney disease, or unspecified chronic kidney disease: Secondary | ICD-10-CM | POA: Diagnosis not present

## 2019-10-04 DIAGNOSIS — M810 Age-related osteoporosis without current pathological fracture: Secondary | ICD-10-CM | POA: Diagnosis not present

## 2019-10-04 DIAGNOSIS — Z87891 Personal history of nicotine dependence: Secondary | ICD-10-CM | POA: Diagnosis not present

## 2019-10-04 DIAGNOSIS — I5030 Unspecified diastolic (congestive) heart failure: Secondary | ICD-10-CM | POA: Diagnosis not present

## 2019-10-04 DIAGNOSIS — N1831 Chronic kidney disease, stage 3a: Secondary | ICD-10-CM | POA: Diagnosis not present

## 2019-10-04 DIAGNOSIS — L89152 Pressure ulcer of sacral region, stage 2: Secondary | ICD-10-CM | POA: Diagnosis not present

## 2019-10-04 DIAGNOSIS — Z7901 Long term (current) use of anticoagulants: Secondary | ICD-10-CM | POA: Diagnosis not present

## 2019-10-04 DIAGNOSIS — Z86718 Personal history of other venous thrombosis and embolism: Secondary | ICD-10-CM | POA: Diagnosis not present

## 2019-10-04 DIAGNOSIS — E782 Mixed hyperlipidemia: Secondary | ICD-10-CM | POA: Diagnosis not present

## 2019-10-04 DIAGNOSIS — G250 Essential tremor: Secondary | ICD-10-CM | POA: Diagnosis not present

## 2019-10-04 DIAGNOSIS — H53461 Homonymous bilateral field defects, right side: Secondary | ICD-10-CM | POA: Diagnosis not present

## 2019-10-06 DIAGNOSIS — H53462 Homonymous bilateral field defects, left side: Secondary | ICD-10-CM | POA: Diagnosis not present

## 2019-10-06 DIAGNOSIS — I69398 Other sequelae of cerebral infarction: Secondary | ICD-10-CM | POA: Diagnosis not present

## 2019-10-06 DIAGNOSIS — I69322 Dysarthria following cerebral infarction: Secondary | ICD-10-CM | POA: Diagnosis not present

## 2019-10-06 DIAGNOSIS — H53461 Homonymous bilateral field defects, right side: Secondary | ICD-10-CM | POA: Diagnosis not present

## 2019-10-06 DIAGNOSIS — L89152 Pressure ulcer of sacral region, stage 2: Secondary | ICD-10-CM | POA: Diagnosis not present

## 2019-10-06 DIAGNOSIS — I69351 Hemiplegia and hemiparesis following cerebral infarction affecting right dominant side: Secondary | ICD-10-CM | POA: Diagnosis not present

## 2019-10-07 DIAGNOSIS — I69398 Other sequelae of cerebral infarction: Secondary | ICD-10-CM | POA: Diagnosis not present

## 2019-10-07 DIAGNOSIS — I69351 Hemiplegia and hemiparesis following cerebral infarction affecting right dominant side: Secondary | ICD-10-CM | POA: Diagnosis not present

## 2019-10-07 DIAGNOSIS — H53461 Homonymous bilateral field defects, right side: Secondary | ICD-10-CM | POA: Diagnosis not present

## 2019-10-07 DIAGNOSIS — H53462 Homonymous bilateral field defects, left side: Secondary | ICD-10-CM | POA: Diagnosis not present

## 2019-10-07 DIAGNOSIS — I69322 Dysarthria following cerebral infarction: Secondary | ICD-10-CM | POA: Diagnosis not present

## 2019-10-07 DIAGNOSIS — L89152 Pressure ulcer of sacral region, stage 2: Secondary | ICD-10-CM | POA: Diagnosis not present

## 2019-10-09 DIAGNOSIS — H53461 Homonymous bilateral field defects, right side: Secondary | ICD-10-CM | POA: Diagnosis not present

## 2019-10-09 DIAGNOSIS — I69398 Other sequelae of cerebral infarction: Secondary | ICD-10-CM | POA: Diagnosis not present

## 2019-10-09 DIAGNOSIS — L89152 Pressure ulcer of sacral region, stage 2: Secondary | ICD-10-CM | POA: Diagnosis not present

## 2019-10-09 DIAGNOSIS — I69322 Dysarthria following cerebral infarction: Secondary | ICD-10-CM | POA: Diagnosis not present

## 2019-10-09 DIAGNOSIS — H53462 Homonymous bilateral field defects, left side: Secondary | ICD-10-CM | POA: Diagnosis not present

## 2019-10-09 DIAGNOSIS — I69351 Hemiplegia and hemiparesis following cerebral infarction affecting right dominant side: Secondary | ICD-10-CM | POA: Diagnosis not present

## 2019-10-13 DIAGNOSIS — I69398 Other sequelae of cerebral infarction: Secondary | ICD-10-CM | POA: Diagnosis not present

## 2019-10-13 DIAGNOSIS — H53462 Homonymous bilateral field defects, left side: Secondary | ICD-10-CM | POA: Diagnosis not present

## 2019-10-13 DIAGNOSIS — L89152 Pressure ulcer of sacral region, stage 2: Secondary | ICD-10-CM | POA: Diagnosis not present

## 2019-10-13 DIAGNOSIS — I69322 Dysarthria following cerebral infarction: Secondary | ICD-10-CM | POA: Diagnosis not present

## 2019-10-13 DIAGNOSIS — H53461 Homonymous bilateral field defects, right side: Secondary | ICD-10-CM | POA: Diagnosis not present

## 2019-10-13 DIAGNOSIS — I69351 Hemiplegia and hemiparesis following cerebral infarction affecting right dominant side: Secondary | ICD-10-CM | POA: Diagnosis not present

## 2019-10-14 ENCOUNTER — Ambulatory Visit (INDEPENDENT_AMBULATORY_CARE_PROVIDER_SITE_OTHER): Payer: Medicare Other | Admitting: Neurology

## 2019-10-14 ENCOUNTER — Other Ambulatory Visit: Payer: Self-pay

## 2019-10-14 ENCOUNTER — Encounter: Payer: Self-pay | Admitting: Neurology

## 2019-10-14 VITALS — BP 102/68 | Ht 67.0 in | Wt 155.4 lb

## 2019-10-14 DIAGNOSIS — R29898 Other symptoms and signs involving the musculoskeletal system: Secondary | ICD-10-CM

## 2019-10-14 DIAGNOSIS — I6381 Other cerebral infarction due to occlusion or stenosis of small artery: Secondary | ICD-10-CM

## 2019-10-14 NOTE — Patient Instructions (Signed)
I had a long d/w patient about her recent lacunar stroke, risk for recurrent stroke/TIAs, personally independently reviewed imaging studies and stroke evaluation results and answered questions.Continue warfarin daily  for secondary stroke prevention given history of multiple DVTs but stopped Plavix after 1 month due to increased risk of bruising and bleeding and no proven benefit in preventing strokes and maintain strict control of hypertension with blood pressure goal below 130/90, diabetes with hemoglobin A1c goal below 6.5% and lipids with LDL cholesterol goal below 70 mg/dL. I also advised the patient to eat a healthy diet with plenty of whole grains, cereals, fruits and vegetables, exercise regularly and maintain ideal body weight.  Continue physical and occupational therapy.  Followup in the future with my nurse practitioner Shanda Bumps in 3 months or call earlier if necessary.  Stroke Prevention Some medical conditions and behaviors are associated with a higher chance of having a stroke. You can help prevent a stroke by making nutrition, lifestyle, and other changes, including managing any medical conditions you may have. What nutrition changes can be made?   Eat healthy foods. You can do this by: ? Choosing foods high in fiber, such as fresh fruits and vegetables and whole grains. ? Eating at least 5 or more servings of fruits and vegetables a day. Try to fill half of your plate at each meal with fruits and vegetables. ? Choosing lean protein foods, such as lean cuts of meat, poultry without skin, fish, tofu, beans, and nuts. ? Eating low-fat dairy products. ? Avoiding foods that are high in salt (sodium). This can help lower blood pressure. ? Avoiding foods that have saturated fat, trans fat, and cholesterol. This can help prevent high cholesterol. ? Avoiding processed and premade foods.  Follow your health care provider's specific guidelines for losing weight, controlling high blood pressure  (hypertension), lowering high cholesterol, and managing diabetes. These may include: ? Reducing your daily calorie intake. ? Limiting your daily sodium intake to 1,500 milligrams (mg). ? Using only healthy fats for cooking, such as olive oil, canola oil, or sunflower oil. ? Counting your daily carbohydrate intake. What lifestyle changes can be made?  Maintain a healthy weight. Talk to your health care provider about your ideal weight.  Get at least 30 minutes of moderate physical activity at least 5 days a week. Moderate activity includes brisk walking, biking, and swimming.  Do not use any products that contain nicotine or tobacco, such as cigarettes and e-cigarettes. If you need help quitting, ask your health care provider. It may also be helpful to avoid exposure to secondhand smoke.  Limit alcohol intake to no more than 1 drink a day for nonpregnant women and 2 drinks a day for men. One drink equals 12 oz of beer, 5 oz of wine, or 1 oz of hard liquor.  Stop any illegal drug use.  Avoid taking birth control pills. Talk to your health care provider about the risks of taking birth control pills if: ? You are over 76 years old. ? You smoke. ? You get migraines. ? You have ever had a blood clot. What other changes can be made?  Manage your cholesterol levels. ? Eating a healthy diet is important for preventing high cholesterol. If cholesterol cannot be managed through diet alone, you may also need to take medicines. ? Take any prescribed medicines to control your cholesterol as told by your health care provider.  Manage your diabetes. ? Eating a healthy diet and exercising regularly are important parts  of managing your blood sugar. If your blood sugar cannot be managed through diet and exercise, you may need to take medicines. ? Take any prescribed medicines to control your diabetes as told by your health care provider.  Control your hypertension. ? To reduce your risk of stroke, try  to keep your blood pressure below 130/80. ? Eating a healthy diet and exercising regularly are an important part of controlling your blood pressure. If your blood pressure cannot be managed through diet and exercise, you may need to take medicines. ? Take any prescribed medicines to control hypertension as told by your health care provider. ? Ask your health care provider if you should monitor your blood pressure at home. ? Have your blood pressure checked every year, even if your blood pressure is normal. Blood pressure increases with age and some medical conditions.  Get evaluated for sleep disorders (sleep apnea). Talk to your health care provider about getting a sleep evaluation if you snore a lot or have excessive sleepiness.  Take over-the-counter and prescription medicines only as told by your health care provider. Aspirin or blood thinners (antiplatelets or anticoagulants) may be recommended to reduce your risk of forming blood clots that can lead to stroke.  Make sure that any other medical conditions you have, such as atrial fibrillation or atherosclerosis, are managed. What are the warning signs of a stroke? The warning signs of a stroke can be easily remembered as BEFAST.  B is for balance. Signs include: ? Dizziness. ? Loss of balance or coordination. ? Sudden trouble walking.  E is for eyes. Signs include: ? A sudden change in vision. ? Trouble seeing.  F is for face. Signs include: ? Sudden weakness or numbness of the face. ? The face or eyelid drooping to one side.  A is for arms. Signs include: ? Sudden weakness or numbness of the arm, usually on one side of the body.  S is for speech. Signs include: ? Trouble speaking (aphasia). ? Trouble understanding.  T is for time. ? These symptoms may represent a serious problem that is an emergency. Do not wait to see if the symptoms will go away. Get medical help right away. Call your local emergency services (911 in the  [place] Do not drive yourself to the hospital.  Other signs of stroke may include: ? A sudden, severe headache with no known cause. ? Nausea or vomiting. ? Seizure. Where to find more information For more information, visit:  American Stroke Association: www.strokeassociation.org  National Stroke Association: www.stroke.org Summary  You can prevent a stroke by eating healthy, exercising, not smoking, limiting alcohol intake, and managing any medical conditions you may have.  Do not use any products that contain nicotine or tobacco, such as cigarettes and e-cigarettes. If you need help quitting, ask your health care provider. It may also be helpful to avoid exposure to secondhand smoke.  Remember BEFAST for warning signs of stroke. Get help right away if you or a loved one has any of these signs. This information is not intended to replace advice given to you by your health care provider. Make sure you discuss any questions you have with your health care provider. Document Revised: 03/16/2017 Document Reviewed: 05/09/2016 Elsevier Patient Education  2020 [employer].

## 2019-10-14 NOTE — Progress Notes (Signed)
[facility] [address] [place]. [place] [address] [phone]       OFFICE CONSULT NOTE  [name] Date of Birth:  08-02-1940 Medical Record Number:  [id number]   Referring MD: Burnadette Pop  Reason for Referral: Stroke  HPI: [name] is a 79 year old Caucasian lady seen today for initial office consultation visit for stroke.  She is accompanied by her friend Roberta Mercer.  History is obtained from them, review of referral notes and have personally reviewed imaging films in PACS.  She presented on 08/08/2019 to [facility] with sudden onset of right hand weakness and difficulty holding objects.  She presented beyond time window for TPA.  She was seen by telemetry neurologist.  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  Echocardiogram showed normal ejection fraction.  Carotid ultrasound showed no significant extracranial stenosis.  LDL cholesterol was elevated 86 mg percent.  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  She had also been on aspirin which was switched to Plavix.  CT angiogram did not show large vessel stenosis or occlusion.  Hemoglobin A1c was 5.6.  She was seen by physical occupational and speech therapy and [facility] where she was there for about 1 or 2 weeks and then currently she is staying at home.  She is getting some home physical occupational therapy yet.  She still has some diminished fine motor skills in the right hand but it seems to be improving.  She is tolerating Plavix and Coumadin well but does agree to easy bruising though she has had no bleeding episodes.  She does also have remote history of stroke 5 years ago from which she recovered well.  She states her blood pressure has been under good control she is tolerating Crestor which now she is taking daily well though she feels this may be affecting her  memory.  She also history of benign essential tremor for many years and for which she takes Topamax 25 mg daily which seems to be working well.  She does not state that tremor is bothersome or increasing.  She had a 2-week external heart monitor placed which showed few episodes of paroxysmal supraventricular tachycardia but no atrial fibrillation.  MRI showed a left basal ganglia infarct but also showed area of patchy chronic encephalomalacia in the left occipital pole and small area of encephalomalacia in the anterior right parietal lobe compatible with previous strokes as well as a chronic lacunar infarct in the right paracentral pons and bilateral cerebellum.  She was on apixaban which was switched to warfarin for low GFR of 48.  Her INR on admission was 2.0  ROS:   14 system review of systems is positive for weakness, decreased dexterity, and tremor, bruising all other systems negative  PMH:  Past Medical History:  Diagnosis Date  . CKD (chronic kidney disease)   . DVT (deep venous thrombosis) (HCC)   . Esophageal spasm   . Essential tremor   . History of stroke 11/14/2014  . History of venous thromboembolism   . Hyperlipidemia   . Post-menopausal atrophic vaginitis   . Stroke (HCC)   . TIA (transient ischemic attack)     Social History:  Social History   Socioeconomic History  . Marital status: Single    Spouse name: Not on file  . Number of children: Not on file  . Years  of education: Not on file  . Highest education level: Not on file  Occupational History  . Not on file  Tobacco Use  . Smoking status: Former Smoker    Quit date: 1976    Years since quitting: 45.5  . Smokeless tobacco: Never Used  Substance and Sexual Activity  . Alcohol use: Never  . Drug use: Never  . Sexual activity: Not on file  Other Topics Concern  . Not on file  Social History Narrative  . Not on file   Social Determinants of Health   Financial Resource Strain:   . Difficulty of Paying  Living Expenses:   Food Insecurity:   . Worried About Programme researcher, broadcasting/film/video in the Last Year:   . Barista in the Last Year:   Transportation Needs:   . Freight forwarder (Medical):   Marland Kitchen Lack of Transportation (Non-Medical):   Physical Activity:   . Days of Exercise per Week:   . Minutes of Exercise per Session:   Stress:   . Feeling of Stress :   Social Connections:   . Frequency of Communication with Friends and Family:   . Frequency of Social Gatherings with Friends and Family:   . Attends Religious Services:   . Active Member of Clubs or Organizations:   . Attends Banker Meetings:   Marland Kitchen Marital Status:   Intimate Partner Violence:   . Fear of Current or Ex-Partner:   . Emotionally Abused:   Marland Kitchen Physically Abused:   . Sexually Abused:     Medications:   Current Outpatient Medications on File Prior to Visit  Medication Sig Dispense Refill  . amLODipine (NORVASC) 5 MG tablet Take 2.5 mg by mouth as needed.     Florian Buff, Borago officinalis, (BORAGE 1000 PO) Take by mouth.    . Calcium Carb-Cholecalciferol (CALCIUM 600 + D PO) Take by mouth daily.    . carvedilol (COREG) 3.125 MG tablet Take 1 tablet (3.125 mg total) by mouth 2 (two) times daily. 180 tablet 1  . cholecalciferol (VITAMIN D3) 25 MCG (1000 UT) tablet Take 1,000 Units by mouth daily.    . clopidogrel (PLAVIX) 75 MG tablet Take 75 mg by mouth daily.    . cyanocobalamin 1000 MCG tablet Take 1,000 mcg by mouth daily.    Marland Kitchen estradiol (ESTRACE) 0.1 MG/GM vaginal cream once a week.     . folic acid (FOLVITE) 1 MG tablet Take 666 mg by mouth daily.     Marland Kitchen levocetirizine (XYZAL) 5 MG tablet Take 5 mg by mouth every evening.    . loratadine (CLARITIN) 10 MG tablet Take 10 mg by mouth daily.    . Magnesium 250 MG TABS Take 250 mg by mouth daily.    . Omega-3 Fatty Acids (FISH OIL) 1000 MG CAPS Take 1,000 mg by mouth daily.     Marland Kitchen pyridOXINE (VITAMIN B-6) 100 MG tablet Take 100 mg by mouth daily.    .  rosuvastatin (CRESTOR) 10 MG tablet Take 10 mg by mouth daily.     Marland Kitchen thiamine 100 MG tablet Take 100 mg by mouth daily.    Marland Kitchen topiramate (TOPAMAX) 25 MG tablet 25 mg daily. Take 1 daily for tremors    . warfarin (COUMADIN) 4 MG tablet Take 4 mg by mouth daily. Take 1.25 on Tuesday and fridays and 2.5 the other days     No current facility-administered medications on file prior to visit.    Allergies:  Allergies  Allergen Reactions  . Bactrim [Sulfamethoxazole-Trimethoprim]   . Diovan [Valsartan]     Physical Exam General: well developed, well nourished elderly Caucasian lady, seated, in no evident distress Head: head normocephalic and atraumatic.   Neck: supple with soft left carotid bruit Cardiovascular: regular rate and rhythm, soft ejection systolic murmur over the aortic area. Musculoskeletal: no deformity Skin:  no rash/petichiae Vascular:  Normal pulses all extremities  Neurologic Exam Mental Status: Awake and fully alert. Oriented to place and time. Recent and remote memory intact. Attention span, concentration and fund of knowledge appropriate. Mood and affect appropriate.  Cranial Nerves: Fundoscopic exam reveals sharp disc margins. Pupils equal, briskly reactive to light. Extraocular movements full without nystagmus. Visual fields full to confrontation. Hearing intact. Facial sensation intact. Face, tongue, palate moves normally and symmetrically.  Motor: Normal bulk and tone. Normal strength in all tested extremity muscles.  Mild diminished fine finger movements on the right.  Orbits left over right upper extremity.  Mild action tremor of left upper extremity which is absent at rest.  No pill-rolling or cogwheel rigidity. Sensory.: intact to touch , pinprick , position and vibratory sensation.  Coordination: Rapid alternating movements normal in all extremities. Finger-to-nose and heel-to-shin performed accurately bilaterally. Gait and Station: Arises from chair without  difficulty. Stance is normal. Gait demonstrates normal stride length and balance . Able to heel, toe and tandem walk without difficulty.  Reflexes: 1+ and symmetric. Toes downgoing.   NIHSS 0 modified Rankin 2  ASSESSMENT: 79 year old Caucasian lady with left basal ganglia infarct in April 2021 likely from small vessel disease.  Vascular risk factors of hypertension hyperlipidemia and age only.  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  She also has longstanding mild benign essential tremor which is well controlled on low-dose Topamax.     PLAN: I had a long d/w patient about her recent lacunar stroke, risk for recurrent stroke/TIAs, personally independently reviewed imaging studies and stroke evaluation results and answered questions.Continue warfarin daily  for secondary stroke prevention given history of multiple DVTs but stop Plavix after 1 month due to increased risk of bruising and bleeding and no proven benefit in preventing strokes and maintain strict control of hypertension with blood pressure goal below 130/90, diabetes with hemoglobin A1c goal below 6.5% and lipids with LDL cholesterol goal below 70 mg/dL. I also advised the patient to eat a healthy diet with plenty of whole grains, cereals, fruits and vegetables, exercise regularly and maintain ideal body weight.  Continue physical and occupational therapy.  Continue Topamax 25 mg daily for essential tremor which appears to be well controlled.  Greater than 50% time during this 45-minute consultation visit was spent on counseling and coordination of care and discussion about her lacunar stroke and stroke prevention and answering questions.  Followup in the future with my nurse practitioner Shanda Bumps in 3 months or call earlier if necessary. Delia Heady, MD  [facility] [address] [place], [place] [address]  Phone [phone] Fax [phone] Note: This document was  prepared with digital dictation and possible smart phrase technology. Any transcriptional errors that result from this process are unintentional.

## 2019-10-17 DIAGNOSIS — L89152 Pressure ulcer of sacral region, stage 2: Secondary | ICD-10-CM | POA: Diagnosis not present

## 2019-10-17 DIAGNOSIS — I69351 Hemiplegia and hemiparesis following cerebral infarction affecting right dominant side: Secondary | ICD-10-CM | POA: Diagnosis not present

## 2019-10-17 DIAGNOSIS — I69398 Other sequelae of cerebral infarction: Secondary | ICD-10-CM | POA: Diagnosis not present

## 2019-10-17 DIAGNOSIS — H53462 Homonymous bilateral field defects, left side: Secondary | ICD-10-CM | POA: Diagnosis not present

## 2019-10-17 DIAGNOSIS — H53461 Homonymous bilateral field defects, right side: Secondary | ICD-10-CM | POA: Diagnosis not present

## 2019-10-17 DIAGNOSIS — I69322 Dysarthria following cerebral infarction: Secondary | ICD-10-CM | POA: Diagnosis not present

## 2019-10-21 ENCOUNTER — Other Ambulatory Visit: Payer: Self-pay

## 2019-10-21 ENCOUNTER — Encounter: Payer: Self-pay | Admitting: Cardiology

## 2019-10-21 ENCOUNTER — Ambulatory Visit (INDEPENDENT_AMBULATORY_CARE_PROVIDER_SITE_OTHER): Payer: Medicare Other | Admitting: Cardiology

## 2019-10-21 VITALS — BP 122/62 | HR 76 | Ht 67.0 in

## 2019-10-21 DIAGNOSIS — E782 Mixed hyperlipidemia: Secondary | ICD-10-CM

## 2019-10-21 DIAGNOSIS — I361 Nonrheumatic tricuspid (valve) insufficiency: Secondary | ICD-10-CM | POA: Insufficient documentation

## 2019-10-21 DIAGNOSIS — I351 Nonrheumatic aortic (valve) insufficiency: Secondary | ICD-10-CM | POA: Diagnosis not present

## 2019-10-21 DIAGNOSIS — I350 Nonrheumatic aortic (valve) stenosis: Secondary | ICD-10-CM

## 2019-10-21 DIAGNOSIS — I10 Essential (primary) hypertension: Secondary | ICD-10-CM | POA: Diagnosis not present

## 2019-10-21 HISTORY — DX: Nonrheumatic tricuspid (valve) insufficiency: I36.1

## 2019-10-21 MED ORDER — CARVEDILOL 6.25 MG PO TABS
6.2500 mg | ORAL_TABLET | Freq: Two times a day (BID) | ORAL | 3 refills | Status: DC
Start: 2019-10-21 — End: 2020-12-17

## 2019-10-21 MED ORDER — ROSUVASTATIN CALCIUM 20 MG PO TABS
20.0000 mg | ORAL_TABLET | Freq: Every day | ORAL | 3 refills | Status: DC
Start: 2019-10-21 — End: 2020-12-15

## 2019-10-21 NOTE — Progress Notes (Signed)
Cardiology Office Note:    Date:  10/21/2019   ID:  Roberta Mercer, DOB 25-Nov-1940, MRN [id number]  PCP:  Lise Auer, MD  Cardiologist:  Thomasene Ripple, DO  Electrophysiologist:  None   Referring MD: Lise Auer, MD   "I am here for a follow up visit"  History of Present Illness:    Roberta Mercer is a 79 y.o. female with a hx of hyperlipidemia, history of CVA she has had multiple CVAs in the past with most recent being 2 months ago.  She was on Eliquis in the past but was placed back on Coumadin.  I initially saw the patient on February 11, 2019 she reported that she was experiencing shortness of breath on exertion. Her physical exam did have evidence of bilateral crackles as well as ankle edema. There is also murmur which I do suspect valvular heart disease was contributing. Therefore started the patient on Aldactone 12.5 mg 3 times a week in order transthoracic echocardiogram. In the interim she follow-up due to elevated blood pressure. At that time I started her on nadolol as she had taken his medication in the past but unfortunately her insurance company could not pay for this medication therefore we changed it to carvedilol 3.125 mg daily.  She was seen on 05/05/2019 at that time she was still hypertensive - she was still short of breath. With her history of diastolic dysfunction, I increase her Aldactone to 12.5 mg daily from 3x a week.She was then continue on her coreg as well .  I saw the patient on June 05, 2019 at that time no changes were made to her medication and she did tell me that less than 140/90 she experienced significant lightheadedness and dizziness.  In the interim the patient was admitted to [facility] due to lower extremity weakness and slurred speech.  MRI did show acute left internal capsule acute infarct.  Aspirin was stopped she was started on Plavix.  She was then sent to rehab.  I last saw the patient on Aug 25, 2019 at that time I placed a  monitor on the patient to understand there is any to rule out atrial fibrillation.  She did wear her monitor she is here to discuss the results.  Past Medical History:  Diagnosis Date  . CKD (chronic kidney disease)   . DVT (deep venous thrombosis) (HCC)   . Esophageal spasm   . Essential tremor   . History of stroke 11/14/2014  . History of venous thromboembolism   . Hyperlipidemia   . Post-menopausal atrophic vaginitis   . Stroke (HCC)   . TIA (transient ischemic attack)     Past Surgical History:  Procedure Laterality Date  . CATARACT EXTRACTION    . ELBOW SURGERY Left    to repair nerve/ not successful  . TRIGGER FINGER RELEASE      Current Medications: Current Meds  Medication Sig  . Borage, Borago officinalis, (BORAGE 1000 PO) Take by mouth.  . Calcium Carb-Cholecalciferol (CALCIUM 600 + D PO) Take by mouth daily.  . cholecalciferol (VITAMIN D3) 25 MCG (1000 UT) tablet Take 1,000 Units by mouth daily.  . clopidogrel (PLAVIX) 75 MG tablet Take 75 mg by mouth daily.  . cyanocobalamin 1000 MCG tablet Take 1,000 mcg by mouth daily.  Marland Kitchen estradiol (ESTRACE) 0.1 MG/GM vaginal cream once a week.   . folic acid (FOLVITE) 1 MG tablet Take 666 mg by mouth daily.   Marland Kitchen levocetirizine (XYZAL) 5 MG tablet Take  5 mg by mouth every evening.  . Magnesium 250 MG TABS Take 250 mg by mouth daily.  . Omega-3 Fatty Acids (FISH OIL) 1000 MG CAPS Take 1,000 mg by mouth daily.   Marland Kitchen pyridOXINE (VITAMIN B-6) 100 MG tablet Take 100 mg by mouth daily.  Marland Kitchen thiamine 100 MG tablet Take 100 mg by mouth daily.  Marland Kitchen topiramate (TOPAMAX) 25 MG tablet 25 mg daily. Take 1 daily for tremors  . warfarin (COUMADIN) 2.5 MG tablet   . warfarin (COUMADIN) 4 MG tablet Take 4 mg by mouth daily. Take 1.25 on Tuesday and fridays and 2.5 the other days  . [DISCONTINUED] amLODipine (NORVASC) 5 MG tablet Take 2.5 mg by mouth as needed.   . [DISCONTINUED] carvedilol (COREG) 3.125 MG tablet Take 1 tablet (3.125 mg total) by  mouth 2 (two) times daily.  . [DISCONTINUED] rosuvastatin (CRESTOR) 10 MG tablet Take 10 mg by mouth daily.      Allergies:   Bactrim [sulfamethoxazole-trimethoprim] and Diovan [valsartan]   Social History   Socioeconomic History  . Marital status: Single    Spouse name: Not on file  . Number of children: Not on file  . Years of education: Not on file  . Highest education level: Not on file  Occupational History  . Not on file  Tobacco Use  . Smoking status: Former Smoker    Quit date: 1976    Years since quitting: 45.5  . Smokeless tobacco: Never Used  Substance and Sexual Activity  . Alcohol use: Never  . Drug use: Never  . Sexual activity: Not on file  Other Topics Concern  . Not on file  Social History Narrative  . Not on file   Social Determinants of Health   Financial Resource Strain:   . Difficulty of Paying Living Expenses:   Food Insecurity:   . Worried About Programme researcher, broadcasting/film/video in the Last Year:   . Barista in the Last Year:   Transportation Needs:   . Freight forwarder (Medical):   Marland Kitchen Lack of Transportation (Non-Medical):   Physical Activity:   . Days of Exercise per Week:   . Minutes of Exercise per Session:   Stress:   . Feeling of Stress :   Social Connections:   . Frequency of Communication with Friends and Family:   . Frequency of Social Gatherings with Friends and Family:   . Attends Religious Services:   . Active Member of Clubs or Organizations:   . Attends Banker Meetings:   Marland Kitchen Marital Status:      Family History: The patient's family history includes Heart disease in her mother; Stroke in her maternal grandmother and mother; Vascular Disease in her maternal grandmother.  ROS:   Review of Systems  Constitution: Negative for decreased appetite, fever and weight gain.  HENT: Negative for congestion, ear discharge, hoarse voice and sore throat.   Eyes: Negative for discharge, redness, vision loss in right eye and  visual halos.  Cardiovascular: Negative for chest pain, dyspnea on exertion, leg swelling, orthopnea and palpitations.  Respiratory: Negative for cough, hemoptysis, shortness of breath and snoring.   Endocrine: Negative for heat intolerance and polyphagia.  Hematologic/Lymphatic: Negative for bleeding problem. Does not bruise/bleed easily.  Skin: Negative for flushing, nail changes, rash and suspicious lesions.  Musculoskeletal: Negative for arthritis, joint pain, muscle cramps, myalgias, neck pain and stiffness.  Gastrointestinal: Negative for abdominal pain, bowel incontinence, diarrhea and excessive appetite.  Genitourinary: Negative  for decreased libido, genital sores and incomplete emptying.  Neurological: Negative for brief paralysis, focal weakness, headaches and loss of balance.  Psychiatric/Behavioral: Negative for altered mental status, depression and suicidal ideas.  Allergic/Immunologic: Negative for HIV exposure and persistent infections.    EKGs/Labs/Other Studies Reviewed:    The following studies were reviewed today:   EKG:  None today  Bilateral carotid artery duplex shows moderate to large amount of bilateral atherosclerotic plaque, left greater than right not resulting in hemodynamically significant stenosis within either internal carotid artery.  Transthoracic echocardiogram done on August 08, 2019 there was mild concentric left hypertrophy.  Overall left ventricular systolic function 60 to 65%.  Impaired relaxation.  Mild to moderate aortic regurgitation.  Mild aortic stenosis.  AVA by VTI 9.5 cm, mean gradient 22 mmHg.  Mild mitral regurgitation.  Mild to moderate tricuspid regurgitation.  Mild pulmonary hypertension.  Recent Labs: 05/26/2019: BUN 32; Creatinine, Ser 1.15; Potassium 4.6; Sodium 140  Recent Lipid Panel No results found for: CHOL, TRIG, HDL, CHOLHDL, VLDL, LDLCALC, LDLDIRECT  Physical Exam:    VS:  BP 122/62   Pulse 76   Ht 5\' 7"  (1.702 m)   SpO2  99%   BMI 24.34 kg/m     Wt Readings from Last 3 Encounters:  10/14/19 155 lb 6.4 oz (70.5 kg)  08/25/19 157 lb (71.2 kg)  06/05/19 155 lb (70.3 kg)     GEN: Well nourished, well developed in no acute distress HEENT: Normal NECK: No JVD; No carotid bruits LYMPHATICS: No lymphadenopathy CARDIAC: S1S2 noted,RRR, no murmurs, rubs, gallops RESPIRATORY:  Clear to auscultation without rales, wheezing or rhonchi  ABDOMEN: Soft, non-tender, non-distended, +bowel sounds, no guarding. EXTREMITIES: No edema, No cyanosis, no clubbing MUSCULOSKELETAL:  No deformity  SKIN: Warm and dry NEUROLOGIC:  Alert and oriented x 3, non-focal PSYCHIATRIC:  Normal affect, good insight  ASSESSMENT:    1. Mixed hyperlipidemia   2. Essential hypertension   3. Nonrheumatic aortic valve insufficiency   4. Nonrheumatic aortic valve stenosis   5. Nonrheumatic tricuspid valve regurgitation    PLAN:     Her monitor did show evidence of paroxysmal atrial tachycardia.  She did have close to 45 episodes.  For this will increase her carvedilol to 6.25 mg twice a day cautiously.  We will stop her amlodipine.  As the patient does also have orthostatic hypotension therefore do not want to drop her blood pressure significantly low.  She tells me if her blood pressure goes any lower than 130 she has significant lightheadedness and dizziness.  We plan to repeat her lipid profile with LP(a) prior to her last visit.  For now increase her Crestor to 20 mg daily.  If she does not meet her goal of less than 70 we will add Zetia.  Neurology has recommended the patient stop her Plavix at the end of the month.  Given her recurrent strokes I think would be appropriate to increase her INR goal to 2.5-3.  I have discussed this with the patient she is in agreement.   In terms of her valvular abnormalities she denies any chest pain, shortness of breath.  There is no signs of heart failure at this time.  The patient is in agreement  with the above plan. The patient left the office in stable condition.  The patient will follow up in 3 months or sooner if needed.   Medication Adjustments/Labs and Tests Ordered: Current medicines are reviewed at length with the patient today.  Concerns regarding medicines are outlined above.  Orders Placed This Encounter  Procedures  . Lipid panel  . Lipoprotein A (LPA)   Meds ordered this encounter  Medications  . rosuvastatin (CRESTOR) 20 MG tablet    Sig: Take 1 tablet (20 mg total) by mouth daily.    Dispense:  90 tablet    Refill:  3  . carvedilol (COREG) 6.25 MG tablet    Sig: Take 1 tablet (6.25 mg total) by mouth 2 (two) times daily.    Dispense:  180 tablet    Refill:  3    Patient Instructions  Medication Instructions:  Your physician has recommended you make the following change in your medication:  STOP: Amlodipine INCREASE: Crestor 20 mg take one tablet by mouth daily.  INCREASE: Coreg 6.25 mg take one tablet by mouth twice daily. *If you need a refill on your cardiac medications before your next appointment, please call your pharmacy*   Lab Work: Your physician recommends that you return for lab work in: Before your next visit. These labs need to be completed while fasting.  Lpa, Lipids If you have labs (blood work) drawn today and your tests are completely normal, you will receive your results only by: Marland Kitchen MyChart Message (if you have MyChart) OR . A paper copy in the mail If you have any lab test that is abnormal or we need to change your treatment, we will call you to review the results.   Testing/Procedures: None   Follow-Up: At [facility], you and your health needs are our priority.  As part of our continuing mission to provide you with exceptional heart care, we have created designated Provider Care Teams.  These Care Teams include your primary Cardiologist (physician) and Advanced Practice Providers (APPs -  Physician Assistants and Nurse  Practitioners) who all work together to provide you with the care you need, when you need it.  We recommend signing up for the patient portal called "MyChart".  Sign up information is provided on this After Visit Summary.  MyChart is used to connect with patients for Virtual Visits (Telemedicine).  Patients are able to view lab/test results, encounter notes, upcoming appointments, etc.  Non-urgent messages can be sent to your provider as well.   To learn more about what you can do with MyChart, go to [email or web address].    Your next appointment:   3 month(s)  The format for your next appointment:   In Person  Provider:   Thomasene Ripple, DO   Other Instructions      Adopting a Healthy Lifestyle.  Know what a healthy weight is for you (roughly BMI <25) and aim to maintain this   Aim for 7+ servings of fruits and vegetables daily   65-80+ fluid ounces of water or unsweet tea for healthy kidneys   Limit to max 1 drink of alcohol per day; avoid smoking/tobacco   Limit animal fats in diet for cholesterol and heart health - choose grass fed whenever available   Avoid highly processed foods, and foods high in saturated/trans fats   Aim for low stress - take time to unwind and care for your mental health   Aim for 150 min of moderate intensity exercise weekly for heart health, and weights twice weekly for bone health   Aim for 7-9 hours of sleep daily   When it comes to diets, agreement about the perfect plan isnt easy to find, even among the experts. Experts at the [employer] developed an idea known as the Healthy Eating Plate. Just imagine a plate divided into logical, healthy portions.   The emphasis is on diet quality:   Load up on vegetables and fruits - one-half of your plate: Aim for color and variety, and remember that potatoes dont count.   Go for whole grains - one-quarter of your plate: Whole wheat, barley, wheat berries, quinoa, oats, brown  rice, and foods made with them. If you want pasta, go with whole wheat pasta.   Protein power - one-quarter of your plate: Fish, chicken, beans, and nuts are all healthy, versatile protein sources. Limit red meat.   The diet, however, does go beyond the plate, offering a few other suggestions.   Use healthy plant oils, such as olive, canola, soy, corn, sunflower and peanut. Check the labels, and avoid partially hydrogenated oil, which have unhealthy trans fats.   If youre thirsty, drink water. Coffee and tea are good in moderation, but skip sugary drinks and limit milk and dairy products to one or two daily servings.   The type of carbohydrate in the diet is more important than the amount. Some sources of carbohydrates, such as vegetables, fruits, whole grains, and beans-are healthier than others.   Finally, stay active  Signed, Thomasene Ripple, DO  10/21/2019 5:01 PM    [facility]

## 2019-10-21 NOTE — Patient Instructions (Signed)
Medication Instructions:  Your physician has recommended you make the following change in your medication:  STOP: Amlodipine INCREASE: Crestor 20 mg take one tablet by mouth daily.  INCREASE: Coreg 6.25 mg take one tablet by mouth twice daily. *If you need a refill on your cardiac medications before your next appointment, please call your pharmacy*   Lab Work: Your physician recommends that you return for lab work in: Before your next visit. These labs need to be completed while fasting.  Lpa, Lipids If you have labs (blood work) drawn today and your tests are completely normal, you will receive your results only by: Marland Kitchen MyChart Message (if you have MyChart) OR . A paper copy in the mail If you have any lab test that is abnormal or we need to change your treatment, we will call you to review the results.   Testing/Procedures: None   Follow-Up: At [facility], you and your health needs are our priority.  As part of our continuing mission to provide you with exceptional heart care, we have created designated Provider Care Teams.  These Care Teams include your primary Cardiologist (physician) and Advanced Practice Providers (APPs -  Physician Assistants and Nurse Practitioners) who all work together to provide you with the care you need, when you need it.  We recommend signing up for the patient portal called "MyChart".  Sign up information is provided on this After Visit Summary.  MyChart is used to connect with patients for Virtual Visits (Telemedicine).  Patients are able to view lab/test results, encounter notes, upcoming appointments, etc.  Non-urgent messages can be sent to your provider as well.   To learn more about what you can do with MyChart, go to [email or web address].    Your next appointment:   3 month(s)  The format for your next appointment:   In Person  Provider:   Thomasene Ripple, DO   Other Instructions

## 2019-10-23 DIAGNOSIS — I69398 Other sequelae of cerebral infarction: Secondary | ICD-10-CM | POA: Diagnosis not present

## 2019-10-23 DIAGNOSIS — H53462 Homonymous bilateral field defects, left side: Secondary | ICD-10-CM | POA: Diagnosis not present

## 2019-10-23 DIAGNOSIS — I69322 Dysarthria following cerebral infarction: Secondary | ICD-10-CM | POA: Diagnosis not present

## 2019-10-23 DIAGNOSIS — H53461 Homonymous bilateral field defects, right side: Secondary | ICD-10-CM | POA: Diagnosis not present

## 2019-10-23 DIAGNOSIS — L89152 Pressure ulcer of sacral region, stage 2: Secondary | ICD-10-CM | POA: Diagnosis not present

## 2019-10-23 DIAGNOSIS — I69351 Hemiplegia and hemiparesis following cerebral infarction affecting right dominant side: Secondary | ICD-10-CM | POA: Diagnosis not present

## 2019-10-24 DIAGNOSIS — H53462 Homonymous bilateral field defects, left side: Secondary | ICD-10-CM | POA: Diagnosis not present

## 2019-10-24 DIAGNOSIS — L89152 Pressure ulcer of sacral region, stage 2: Secondary | ICD-10-CM | POA: Diagnosis not present

## 2019-10-24 DIAGNOSIS — H53461 Homonymous bilateral field defects, right side: Secondary | ICD-10-CM | POA: Diagnosis not present

## 2019-10-24 DIAGNOSIS — I69398 Other sequelae of cerebral infarction: Secondary | ICD-10-CM | POA: Diagnosis not present

## 2019-10-24 DIAGNOSIS — I69351 Hemiplegia and hemiparesis following cerebral infarction affecting right dominant side: Secondary | ICD-10-CM | POA: Diagnosis not present

## 2019-10-24 DIAGNOSIS — I69322 Dysarthria following cerebral infarction: Secondary | ICD-10-CM | POA: Diagnosis not present

## 2019-10-27 DIAGNOSIS — L89152 Pressure ulcer of sacral region, stage 2: Secondary | ICD-10-CM | POA: Diagnosis not present

## 2019-10-27 DIAGNOSIS — I69351 Hemiplegia and hemiparesis following cerebral infarction affecting right dominant side: Secondary | ICD-10-CM | POA: Diagnosis not present

## 2019-10-27 DIAGNOSIS — I69398 Other sequelae of cerebral infarction: Secondary | ICD-10-CM | POA: Diagnosis not present

## 2019-10-27 DIAGNOSIS — H53461 Homonymous bilateral field defects, right side: Secondary | ICD-10-CM | POA: Diagnosis not present

## 2019-10-27 DIAGNOSIS — H53462 Homonymous bilateral field defects, left side: Secondary | ICD-10-CM | POA: Diagnosis not present

## 2019-10-27 DIAGNOSIS — I69322 Dysarthria following cerebral infarction: Secondary | ICD-10-CM | POA: Diagnosis not present

## 2019-10-30 DIAGNOSIS — Z7901 Long term (current) use of anticoagulants: Secondary | ICD-10-CM | POA: Diagnosis not present

## 2019-10-31 DIAGNOSIS — I69322 Dysarthria following cerebral infarction: Secondary | ICD-10-CM | POA: Diagnosis not present

## 2019-10-31 DIAGNOSIS — I69351 Hemiplegia and hemiparesis following cerebral infarction affecting right dominant side: Secondary | ICD-10-CM | POA: Diagnosis not present

## 2019-10-31 DIAGNOSIS — H53462 Homonymous bilateral field defects, left side: Secondary | ICD-10-CM | POA: Diagnosis not present

## 2019-10-31 DIAGNOSIS — H53461 Homonymous bilateral field defects, right side: Secondary | ICD-10-CM | POA: Diagnosis not present

## 2019-10-31 DIAGNOSIS — I69398 Other sequelae of cerebral infarction: Secondary | ICD-10-CM | POA: Diagnosis not present

## 2019-10-31 DIAGNOSIS — L89152 Pressure ulcer of sacral region, stage 2: Secondary | ICD-10-CM | POA: Diagnosis not present

## 2019-11-10 DIAGNOSIS — E782 Mixed hyperlipidemia: Secondary | ICD-10-CM | POA: Diagnosis not present

## 2019-11-10 DIAGNOSIS — G250 Essential tremor: Secondary | ICD-10-CM | POA: Diagnosis not present

## 2019-11-10 DIAGNOSIS — I5030 Unspecified diastolic (congestive) heart failure: Secondary | ICD-10-CM | POA: Diagnosis not present

## 2019-11-10 DIAGNOSIS — Z8673 Personal history of transient ischemic attack (TIA), and cerebral infarction without residual deficits: Secondary | ICD-10-CM | POA: Diagnosis not present

## 2019-11-13 DIAGNOSIS — Z7901 Long term (current) use of anticoagulants: Secondary | ICD-10-CM | POA: Diagnosis not present

## 2019-11-20 DIAGNOSIS — E782 Mixed hyperlipidemia: Secondary | ICD-10-CM | POA: Diagnosis not present

## 2019-11-20 DIAGNOSIS — Z7901 Long term (current) use of anticoagulants: Secondary | ICD-10-CM | POA: Diagnosis not present

## 2019-12-04 DIAGNOSIS — Z7901 Long term (current) use of anticoagulants: Secondary | ICD-10-CM | POA: Diagnosis not present

## 2020-01-05 DIAGNOSIS — Z7901 Long term (current) use of anticoagulants: Secondary | ICD-10-CM | POA: Diagnosis not present

## 2020-01-08 DIAGNOSIS — Z23 Encounter for immunization: Secondary | ICD-10-CM | POA: Diagnosis not present

## 2020-01-15 DIAGNOSIS — E7849 Other hyperlipidemia: Secondary | ICD-10-CM | POA: Diagnosis not present

## 2020-01-15 DIAGNOSIS — M810 Age-related osteoporosis without current pathological fracture: Secondary | ICD-10-CM | POA: Diagnosis not present

## 2020-01-15 DIAGNOSIS — I10 Essential (primary) hypertension: Secondary | ICD-10-CM | POA: Diagnosis not present

## 2020-01-20 DIAGNOSIS — E782 Mixed hyperlipidemia: Secondary | ICD-10-CM | POA: Diagnosis not present

## 2020-01-21 ENCOUNTER — Ambulatory Visit (INDEPENDENT_AMBULATORY_CARE_PROVIDER_SITE_OTHER): Payer: Medicare Other | Admitting: Neurology

## 2020-01-21 ENCOUNTER — Other Ambulatory Visit: Payer: Self-pay

## 2020-01-21 ENCOUNTER — Encounter: Payer: Self-pay | Admitting: Neurology

## 2020-01-21 VITALS — BP 171/70 | HR 55 | Ht 67.0 in | Wt 149.8 lb

## 2020-01-21 DIAGNOSIS — I6381 Other cerebral infarction due to occlusion or stenosis of small artery: Secondary | ICD-10-CM

## 2020-01-21 DIAGNOSIS — I6990 Unspecified sequelae of unspecified cerebrovascular disease: Secondary | ICD-10-CM

## 2020-01-21 LAB — LIPID PANEL
Chol/HDL Ratio: 2.3 ratio (ref 0.0–4.4)
Cholesterol, Total: 143 mg/dL (ref 100–199)
HDL: 61 mg/dL (ref >39)
LDL Chol Calc (NIH): 64 mg/dL (ref 0–99)
Triglycerides: 97 mg/dL (ref 0–149)
VLDL Cholesterol Cal: 18 mg/dL (ref 5–40)

## 2020-01-21 LAB — LIPOPROTEIN A (LPA): Lipoprotein (a): 62.7 nmol/L (ref <75.0)

## 2020-01-21 NOTE — Patient Instructions (Signed)
I had a long discussion with the patient and her friend regarding her recent lacunar stroke from which she seems to have recovered quite well.  Benign essential tremor also appears to be quite well controlled on the low-dose Topamax 25 mg daily.  I recommend she discontinue aspirin and stay on warfarin alone which she needs for DVT for stroke prevention as well.  Maintain strict control of hypertension with blood pressure goal below 130/90, lipids with LDL cholesterol goal below 70 mg percent and hemoglobin A1c goal below 6.5%.  She may return for follow-up in the future in a year or call earlier if necessary.

## 2020-01-21 NOTE — Progress Notes (Signed)
[facility] [address] [place] [address] [phone]       OFFICE FOLLOW UP VISIT NOTE  [name] Date of Birth:  06/17/1940 Medical Record Number:  [id number]   Referring MD: Burnadette Pop  Reason for Referral: Stroke  HPI: Initial visit 10/14/2019 [name] is a 79 year old Caucasian lady seen today for initial office consultation visit for stroke.  She is accompanied by her friend Amy.  History is obtained from them, review of referral notes and have personally reviewed imaging films in PACS.  She presented on 08/08/2019 to [facility] with sudden onset of right hand weakness and difficulty holding objects.  She presented beyond time window for TPA.  She was seen by telemetry neurologist.  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  Echocardiogram showed normal ejection fraction.  Carotid ultrasound showed no significant extracranial stenosis.  LDL cholesterol was elevated 86 mg percent.  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  She had also been on aspirin which was switched to Plavix.  CT angiogram did not show large vessel stenosis or occlusion.  Hemoglobin A1c was 5.6.  She was seen by physical occupational and speech therapy and [facility] where she was there for about 1 or 2 weeks and then currently she is staying at home.  She is getting some home physical occupational therapy yet.  She still has some diminished fine motor skills in the right hand but it seems to be improving.  She is tolerating Plavix and Coumadin well but does agree to easy bruising though she has had no bleeding episodes.  She does also have remote history of stroke 5 years ago from which she recovered well.  She states her blood pressure has been under good control she is tolerating Crestor which now she is taking daily well though she  feels this may be affecting her memory.  She also history of benign essential tremor for many years and for which she takes Topamax 25 mg daily which seems to be working well.  She does not state that tremor is bothersome or increasing.  She had a 2-week external heart monitor placed which showed few episodes of paroxysmal supraventricular tachycardia but no atrial fibrillation.  MRI showed a left basal ganglia infarct but also showed area of patchy chronic encephalomalacia in the left occipital pole and small area of encephalomalacia in the anterior right parietal lobe compatible with previous strokes as well as a chronic lacunar infarct in the right paracentral pons and bilateral cerebellum.  She was on apixaban which was switched to warfarin for low GFR of 48.  Her INR on admission was 2.0 Update 01/21/2020 : She returns for follow-up after last visit 3 months ago.  She is accompanied by her lady friend.  Patient states she is done well.  She is made good recovery from her stroke.  She denies any recurrent stroke or TIA symptoms.  She has discontinued Plavix as a suggested by her primary physician Dr. Welton Flakes put her on aspirin 325 mg instead.  She denies any prior history of cardiac stents, myocardial infarction or unstable angina.  She had lab work done on 11/20/2019 which showed LDL cholesterol of 68 mg percent.  Complete metabolic panel labs unremarkable.  She continues to have mild tremor in her hands but this not functionally disabling.  She remains on  Topamax 25 mg daily which is tolerating well without any side effects.  She has no new complaints. ROS:   14 system review of systems is positive for weakness, decreased dexterity, and tremor, bruising all other systems negative  PMH:  Past Medical History:  Diagnosis Date  . CKD (chronic kidney disease)   . DVT (deep venous thrombosis) (HCC)   . Esophageal spasm   . Essential tremor   . History of stroke 11/14/2014  . History of venous thromboembolism    . Hyperlipidemia   . Post-menopausal atrophic vaginitis   . Stroke (HCC)   . TIA (transient ischemic attack)     Social History:  Social History   Socioeconomic History  . Marital status: Single    Spouse name: Not on file  . Number of children: 0  . Years of education: Not on file  . Highest education level: Not on file  Occupational History  . Occupation: retired  Tobacco Use  . Smoking status: Former Smoker    Quit date: 1976    Years since quitting: 45.7  . Smokeless tobacco: Never Used  Substance and Sexual Activity  . Alcohol use: Never  . Drug use: Never  . Sexual activity: Not on file  Other Topics Concern  . Not on file  Social History Narrative   Lives alone   Right handed   Drinks no caffeine      Social Determinants of Health   Financial Resource Strain:   . Difficulty of Paying Living Expenses: Not on file  Food Insecurity:   . Worried About Programme researcher, broadcasting/film/video in the Last Year: Not on file  . Ran Out of Food in the Last Year: Not on file  Transportation Needs:   . Lack of Transportation (Medical): Not on file  . Lack of Transportation (Non-Medical): Not on file  Physical Activity:   . Days of Exercise per Week: Not on file  . Minutes of Exercise per Session: Not on file  Stress:   . Feeling of Stress : Not on file  Social Connections:   . Frequency of Communication with Friends and Family: Not on file  . Frequency of Social Gatherings with Friends and Family: Not on file  . Attends Religious Services: Not on file  . Active Member of Clubs or Organizations: Not on file  . Attends Banker Meetings: Not on file  . Marital Status: Not on file  Intimate Partner Violence:   . Fear of Current or Ex-Partner: Not on file  . Emotionally Abused: Not on file  . Physically Abused: Not on file  . Sexually Abused: Not on file    Medications:   Current Outpatient Medications on File Prior to Visit  Medication Sig Dispense Refill  .  Borage, Borago officinalis, (BORAGE 1000 PO) Take by mouth.    . Calcium Carb-Cholecalciferol (CALCIUM 600 + D PO) Take by mouth daily.    . carvedilol (COREG) 6.25 MG tablet Take 1 tablet (6.25 mg total) by mouth 2 (two) times daily. 180 tablet 3  . cholecalciferol (VITAMIN D3) 25 MCG (1000 UT) tablet Take 1,000 Units by mouth daily.    . cyanocobalamin 1000 MCG tablet Take 1,000 mcg by mouth daily.    Marland Kitchen estradiol (ESTRACE) 0.1 MG/GM vaginal cream once a week.     . folic acid (FOLVITE) 1 MG tablet Take 666 mg by mouth daily.     . Magnesium 250 MG TABS Take 250 mg by mouth daily.    Marland Kitchen  Omega-3 Fatty Acids (FISH OIL) 1000 MG CAPS Take 1,000 mg by mouth daily.     Marland Kitchen pyridOXINE (VITAMIN B-6) 100 MG tablet Take 100 mg by mouth daily.    Marland Kitchen thiamine 100 MG tablet Take 100 mg by mouth daily.    Marland Kitchen topiramate (TOPAMAX) 25 MG tablet 25 mg daily. Take 1 daily for tremors    . warfarin (COUMADIN) 1 MG tablet Take 0.5 mg by mouth daily.     Marland Kitchen warfarin (COUMADIN) 2.5 MG tablet     . clopidogrel (PLAVIX) 75 MG tablet Take 75 mg by mouth daily.    Marland Kitchen levocetirizine (XYZAL) 5 MG tablet Take 5 mg by mouth every evening.    . rosuvastatin (CRESTOR) 20 MG tablet Take 1 tablet (20 mg total) by mouth daily. 90 tablet 3   No current facility-administered medications on file prior to visit.    Allergies:   Allergies  Allergen Reactions  . Bactrim [Sulfamethoxazole-Trimethoprim]   . Diovan [Valsartan]     Physical Exam General: Frail elderly Caucasian lady, seated, in no evident distress Head: head normocephalic and atraumatic.   Neck: supple with soft left carotid bruit Cardiovascular: regular rate and rhythm, soft ejection systolic murmur over the aortic area. Musculoskeletal: Mild kyphosis Skin:  no rash/petichiae Vascular:  Normal pulses all extremities  Neurologic Exam Mental Status: Awake and fully alert. Oriented to place and time. Recent and remote memory intact. Attention span, concentration  and fund of knowledge appropriate. Mood and affect appropriate.  Cranial Nerves: Fundoscopic exam not done. Pupils equal, briskly reactive to light. Extraocular movements full without nystagmus. Visual fields full to confrontation. Hearing intact. Facial sensation intact. Face, tongue, palate moves normally and symmetrically.  Motor: Normal bulk and tone. Normal strength in all tested extremity muscles.  Mild diminished fine finger movements on the right.  Orbits left over right upper extremity.  Mild action tremor of left upper extremity which is absent at rest.  No pill-rolling or cogwheel rigidity. Sensory.: intact to touch , pinprick , position and vibratory sensation.  Coordination: Rapid alternating movements normal in all extremities. Finger-to-nose and heel-to-shin performed accurately bilaterally. Gait and Station: Arises from chair without difficulty. Stance is normal. Gait demonstrates normal stride length and balance . Able to heel, toe and tandem walk without difficulty.  Reflexes: 1+ and symmetric. Toes downgoing.      ASSESSMENT: 79 year old Caucasian lady with left basal ganglia infarct in April 2021 likely from small vessel disease.  Vascular risk factors of hypertension hyperlipidemia and age only.  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  She also has longstanding mild benign essential tremor which remains well controlled on low-dose Topamax.     PLAN: I had a long discussion with the patient and her friend regarding her recent lacunar stroke from which she seems to have recovered quite well.  Benign essential tremor also appears to be quite well controlled on the low-dose Topamax 25 mg daily.  I recommend she discontinue aspirin and stay on warfarin alone which she needs for DVT for stroke prevention as well.  Maintain strict control of hypertension with blood pressure goal below 130/90, lipids with LDL cholesterol goal below 70 mg percent and  hemoglobin A1c goal below 6.5%.  She may return for follow-up in the future in a year or call earlier if necessary.  Greater than 50% time during this 25-minute  visit was spent on counseling and coordination of care and discussion about her lacunar stroke and  stroke prevention and answering questions.   Delia HeadyPramod Lenzi Marmo, MD  [facility] [address] [place], [place] [id number]  Phone [phone] Fax [phone] Note: This document was prepared with digital dictation and possible smart phrase technology. Any transcriptional errors that result from this process are unintentional.

## 2020-01-28 ENCOUNTER — Other Ambulatory Visit: Payer: Self-pay

## 2020-01-28 DIAGNOSIS — I639 Cerebral infarction, unspecified: Secondary | ICD-10-CM | POA: Insufficient documentation

## 2020-01-28 DIAGNOSIS — G250 Essential tremor: Secondary | ICD-10-CM

## 2020-01-28 DIAGNOSIS — N189 Chronic kidney disease, unspecified: Secondary | ICD-10-CM | POA: Insufficient documentation

## 2020-01-28 DIAGNOSIS — K224 Dyskinesia of esophagus: Secondary | ICD-10-CM

## 2020-01-28 DIAGNOSIS — I10 Essential (primary) hypertension: Secondary | ICD-10-CM

## 2020-01-28 DIAGNOSIS — G459 Transient cerebral ischemic attack, unspecified: Secondary | ICD-10-CM | POA: Insufficient documentation

## 2020-01-28 DIAGNOSIS — I82409 Acute embolism and thrombosis of unspecified deep veins of unspecified lower extremity: Secondary | ICD-10-CM | POA: Insufficient documentation

## 2020-01-28 DIAGNOSIS — I350 Nonrheumatic aortic (valve) stenosis: Secondary | ICD-10-CM

## 2020-01-28 DIAGNOSIS — I351 Nonrheumatic aortic (valve) insufficiency: Secondary | ICD-10-CM

## 2020-01-30 ENCOUNTER — Encounter: Payer: Self-pay | Admitting: Cardiology

## 2020-01-30 ENCOUNTER — Telehealth: Payer: Self-pay | Admitting: Emergency Medicine

## 2020-01-30 ENCOUNTER — Other Ambulatory Visit: Payer: Self-pay

## 2020-01-30 ENCOUNTER — Ambulatory Visit (INDEPENDENT_AMBULATORY_CARE_PROVIDER_SITE_OTHER): Payer: Medicare Other | Admitting: Cardiology

## 2020-01-30 VITALS — BP 128/72 | HR 50 | Ht 67.0 in | Wt 149.6 lb

## 2020-01-30 DIAGNOSIS — Z8673 Personal history of transient ischemic attack (TIA), and cerebral infarction without residual deficits: Secondary | ICD-10-CM

## 2020-01-30 DIAGNOSIS — I10 Essential (primary) hypertension: Secondary | ICD-10-CM

## 2020-01-30 DIAGNOSIS — I351 Nonrheumatic aortic (valve) insufficiency: Secondary | ICD-10-CM

## 2020-01-30 DIAGNOSIS — I350 Nonrheumatic aortic (valve) stenosis: Secondary | ICD-10-CM | POA: Diagnosis not present

## 2020-01-30 DIAGNOSIS — E782 Mixed hyperlipidemia: Secondary | ICD-10-CM | POA: Diagnosis not present

## 2020-01-30 DIAGNOSIS — G459 Transient cerebral ischemic attack, unspecified: Secondary | ICD-10-CM | POA: Diagnosis not present

## 2020-01-30 MED ORDER — HYDROCHLOROTHIAZIDE 12.5 MG PO CAPS
ORAL_CAPSULE | ORAL | 1 refills | Status: DC
Start: 2020-01-30 — End: 2020-12-15

## 2020-01-30 MED ORDER — HYDROCHLOROTHIAZIDE 12.5 MG PO CAPS
12.5000 mg | ORAL_CAPSULE | Freq: Every day | ORAL | 3 refills | Status: DC
Start: 2020-01-30 — End: 2020-01-30

## 2020-01-30 NOTE — Telephone Encounter (Signed)
Called pharmacy informed them that the hydrochlorothiazide was sent incorrectly as daily. They are aware to not accept if it comes through. Pharmacy aware hydrochlorothiazide needs to be 12.5 mg weekly if systolic blood pressure is greater 160.

## 2020-01-30 NOTE — Progress Notes (Signed)
Cardiology Office Note:    Date:  01/30/2020   ID:  Roberta Mercer, DOB 1940/10/25, MRN [id number]  PCP:  Lise AuerKhan, Jaber A, MD  Cardiologist:  Thomasene RippleKardie Krissia Schreier, DO  Electrophysiologist:  None   Referring MD: Lise AuerKhan, Jaber A, MD  " I am doing fine"  History of Present Illness:    Roberta Mercer is a 79 y.o. female with a hx of hyperlipidemia, history of CVA she has had multiple CVAs.  She was on Eliquis in the past but was placed back on Coumadin.  At her last visit in July 2021 I increased the patient carvedilol to 6.25 mg daily and also stop her amlodipine.  She has had trouble with some orthostatic hypotension therefore we continue to monitor her blood pressure.  She did bring her blood pressure recordings to me which is averaging systolic blood pressure 1 50-1 70 most days.  She recently followed up with neurology.  During her visit with neurology they recommended that the patient discontinue aspirin and stay on warfarin alone.   Past Medical History:  Diagnosis Date  . CKD (chronic kidney disease)   . Diastolic dysfunction 06/05/2019  . DVT (deep venous thrombosis) (HCC)   . Esophageal spasm   . Essential hypertension 03/03/2019  . Essential tremor   . History of stroke 11/14/2014  . History of venous thromboembolism   . Hyperlipidemia   . LVH (left ventricular hypertrophy) 06/05/2019  . 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  . Post-menopausal atrophic vaginitis   . Stage 3a chronic kidney disease (HCC) 06/05/2019  . Stroke (HCC)   . TIA (transient ischemic attack)     Past Surgical History:  Procedure Laterality Date  . CATARACT EXTRACTION    . ELBOW SURGERY Left    to repair nerve/ not successful  . TRIGGER FINGER RELEASE      Current Medications: Current Meds  Medication Sig  . aspirin 325 MG tablet Take 325 mg by mouth daily.  Florian Buff. Borage, Borago  officinalis, (BORAGE 1000 PO) Take by mouth.  . Calcium Carb-Cholecalciferol (CALCIUM 600 + D PO) Take by mouth daily.  . carvedilol (COREG) 6.25 MG tablet Take 1 tablet (6.25 mg total) by mouth 2 (two) times daily.  . cholecalciferol (VITAMIN D3) 25 MCG (1000 UT) tablet Take 1,000 Units by mouth daily.  . cyanocobalamin 1000 MCG tablet Take 1,000 mcg by mouth daily.  Marland Kitchen. estradiol (ESTRACE) 0.1 MG/GM vaginal cream once a week.   . fexofenadine (ALLEGRA) 180 MG tablet Take 180 mg by mouth daily.  . folic acid (FOLVITE) 1 MG tablet Take 666 mg by mouth daily.   Marland Kitchen. levocetirizine (XYZAL) 5 MG tablet Take 5 mg by mouth every evening.  . Magnesium 250 MG TABS Take 250 mg by mouth daily.  . medroxyPROGESTERone (PROVERA) 5 MG tablet Take 5 mg by mouth daily.  . Omega-3 Fatty Acids (FISH OIL) 1000 MG CAPS Take 1,000 mg by mouth daily.   Marland Kitchen. pyridOXINE (VITAMIN B-6) 100 MG tablet Take 100 mg by mouth daily.  Marland Kitchen. thiamine 100 MG tablet Take 100 mg by mouth daily.  Marland Kitchen. topiramate (TOPAMAX) 25 MG tablet 25 mg daily. Take 1 daily for tremors  . warfarin (COUMADIN) 1 MG tablet Take 0.5 mg by mouth daily.   Marland Kitchen. warfarin (COUMADIN) 2.5 MG tablet      Allergies:   Bactrim [sulfamethoxazole-trimethoprim] and Diovan [valsartan]   Social History   Socioeconomic  History  . Marital status: Single    Spouse name: Not on file  . Number of children: 0  . Years of education: Not on file  . Highest education level: Not on file  Occupational History  . Occupation: retired  Tobacco Use  . Smoking status: Former Smoker    Quit date: 1976    Years since quitting: 45.8  . Smokeless tobacco: Never Used  Substance and Sexual Activity  . Alcohol use: Never  . Drug use: Never  . Sexual activity: Not on file  Other Topics Concern  . Not on file  Social History Narrative   Lives alone   Right handed   Drinks no caffeine      Social Determinants of Health   Financial Resource Strain:   . Difficulty of Paying  Living Expenses: Not on file  Food Insecurity:   . Worried About Programme researcher, broadcasting/film/video in the Last Year: Not on file  . Ran Out of Food in the Last Year: Not on file  Transportation Needs:   . Lack of Transportation (Medical): Not on file  . Lack of Transportation (Non-Medical): Not on file  Physical Activity:   . Days of Exercise per Week: Not on file  . Minutes of Exercise per Session: Not on file  Stress:   . Feeling of Stress : Not on file  Social Connections:   . Frequency of Communication with Friends and Family: Not on file  . Frequency of Social Gatherings with Friends and Family: Not on file  . Attends Religious Services: Not on file  . Active Member of Clubs or Organizations: Not on file  . Attends Banker Meetings: Not on file  . Marital Status: Not on file     Family History: The patient's family history includes Heart disease in her mother; Stroke in her maternal grandmother and mother; Vascular Disease in her maternal grandmother.  ROS:   Review of Systems  Constitution: Negative for decreased appetite, fever and weight gain.  HENT: Negative for congestion, ear discharge, hoarse voice and sore throat.   Eyes: Negative for discharge, redness, vision loss in right eye and visual halos.  Cardiovascular: Negative for chest pain, dyspnea on exertion, leg swelling, orthopnea and palpitations.  Respiratory: Negative for cough, hemoptysis, shortness of breath and snoring.   Endocrine: Negative for heat intolerance and polyphagia.  Hematologic/Lymphatic: Negative for bleeding problem. Does not bruise/bleed easily.  Skin: Negative for flushing, nail changes, rash and suspicious lesions.  Musculoskeletal: Negative for arthritis, joint pain, muscle cramps, myalgias, neck pain and stiffness.  Gastrointestinal: Negative for abdominal pain, bowel incontinence, diarrhea and excessive appetite.  Genitourinary: Negative for decreased libido, genital sores and incomplete  emptying.  Neurological: Negative for brief paralysis, focal weakness, headaches and loss of balance.  Psychiatric/Behavioral: Negative for altered mental status, depression and suicidal ideas.  Allergic/Immunologic: Negative for HIV exposure and persistent infections.    EKGs/Labs/Other Studies Reviewed:    The following studies were reviewed today:   EKG: None today   Bilateral carotid artery duplex shows moderate to large amount of bilateral atherosclerotic plaque, left greater than right not resulting in hemodynamically significant stenosis within either internal carotid artery.  Transthoracic echocardiogram done on August 08, 2019 there was mild concentric left hypertrophy. Overall left ventricular systolic function 60 to 65%. Impaired relaxation. Mild to moderate aortic regurgitation. Mild aortic stenosis. AVA by VTI 9.5 cm, mean gradient 22 mmHg. Mild mitral regurgitation. Mild to moderate tricuspid regurgitation. Mild pulmonary  hypertension.   Recent Labs: 05/26/2019: BUN 32; Creatinine, Ser 1.15; Potassium 4.6; Sodium 140  Recent Lipid Panel    Component Value Date/Time   CHOL 143 01/20/2020 1049   TRIG 97 01/20/2020 1049   HDL 61 01/20/2020 1049   CHOLHDL 2.3 01/20/2020 1049   LDLCALC 64 01/20/2020 1049    Physical Exam:    VS:  BP 128/72   Pulse (!) 50   Ht 5\' 7"  (1.702 m)   Wt 149 lb 9.6 oz (67.9 kg)   SpO2 97%   BMI 23.43 kg/m     Wt Readings from Last 3 Encounters:  01/30/20 149 lb 9.6 oz (67.9 kg)  01/21/20 149 lb 12.8 oz (67.9 kg)  10/14/19 155 lb 6.4 oz (70.5 kg)     GEN: Well nourished, well developed in no acute distress HEENT: Normal NECK: No JVD; No carotid bruits LYMPHATICS: No lymphadenopathy CARDIAC: S1S2 noted,RRR, no murmurs, rubs, gallops RESPIRATORY:  Clear to auscultation without rales, wheezing or rhonchi  ABDOMEN: Soft, non-tender, non-distended, +bowel sounds, no guarding. EXTREMITIES: No edema, No cyanosis, no  clubbing MUSCULOSKELETAL:  No deformity  SKIN: Warm and dry NEUROLOGIC:  Alert and oriented x 3, non-focal PSYCHIATRIC:  Normal affect, good insight  ASSESSMENT:    1. Essential hypertension   2. Nonrheumatic aortic valve insufficiency   3. Nonrheumatic aortic valve stenosis   4. TIA (transient ischemic attack)   5. Mixed hyperlipidemia   6. History of stroke    PLAN:     Her home blood pressure readings seems to be elevated.  She also does have trouble with orthostatic hypotension.  Her physical exam today showed bilateral leg edema.  Ideally I like to place the patient on additional antihypertensive to keep her numbers at home below 130/80 mmHg.  I will likely start her on a low-dose hydrochlorothiazide but the patient is very cautious about this which is understandable given her history of orthostatic hypotension.  We have agreed that that given the bilateral leg edema and home blood pressure is elevated she will take cautiously start the patient on hydrochlorothiazide 12.5 week and will monitor her closely.  For neurology l continue warfarin for her stroke.  Is taking aspirin she plans to discuss this with Dr. 10/16/19 at her next visit.  I reviewed her lipid profile with her and her LDL goal is well below 70 we will keep her on her current dose of  The patient is in agreement with the above plan. The patient left the office in stable condition.  The patient will follow up in 3 months.   Medication Adjustments/Labs and Tests Ordered: Current medicines are reviewed at length with the patient today.  Concerns regarding medicines are outlined above.  No orders of the defined types were placed in this encounter.  Meds ordered this encounter  Medications  . DISCONTD: hydrochlorothiazide (MICROZIDE) 12.5 MG capsule    Sig: Take 1 capsule (12.5 mg total) by mouth daily.    Dispense:  90 capsule    Refill:  3  . hydrochlorothiazide (MICROZIDE) 12.5 MG capsule    Sig: Take one tablet  weekly if systolic blood pressure greater than 160    Dispense:  15 capsule    Refill:  1    Patient Instructions  Medication Instructions:  Your physician has recommended you make the following change in your medication:   START: hydrochlorothiazide 12.5 mg once weekly if systolic blood pressure greater than 160  *If you need a refill on  your cardiac medications before your next appointment, please call your pharmacy*   Lab Work: None If you have labs (blood work) drawn today and your tests are completely normal, you will receive your results only by: Marland Kitchen MyChart Message (if you have MyChart) OR . A paper copy in the mail If you have any lab test that is abnormal or we need to change your treatment, we will call you to review the results.   Testing/Procedures: None   Follow-Up: At [facility], you and your health needs are our priority.  As part of our continuing mission to provide you with exceptional heart care, we have created designated Provider Care Teams.  These Care Teams include your primary Cardiologist (physician) and Advanced Practice Providers (APPs -  Physician Assistants and Nurse Practitioners) who all work together to provide you with the care you need, when you need it.  We recommend signing up for the patient portal called "MyChart".  Sign up information is provided on this After Visit Summary.  MyChart is used to connect with patients for Virtual Visits (Telemedicine).  Patients are able to view lab/test results, encounter notes, upcoming appointments, etc.  Non-urgent messages can be sent to your provider as well.   To learn more about what you can do with MyChart, go to [email or web address].    Your next appointment:   3 month(s)  The format for your next appointment:   In Person  Provider:   Thomasene Ripple, DO   Other Instructions       Adopting a Healthy Lifestyle.  Know what a healthy weight is for you (roughly BMI <25) and aim to maintain  this   Aim for 7+ servings of fruits and vegetables daily   65-80+ fluid ounces of water or unsweet tea for healthy kidneys   Limit to max 1 drink of alcohol per day; avoid smoking/tobacco   Limit animal fats in diet for cholesterol and heart health - choose grass fed whenever available   Avoid highly processed foods, and foods high in saturated/trans fats   Aim for low stress - take time to unwind and care for your mental health   Aim for 150 min of moderate intensity exercise weekly for heart health, and weights twice weekly for bone health   Aim for 7-9 hours of sleep daily   When it comes to diets, agreement about the perfect plan isnt easy to find, even among the experts. Experts at the [facility] [employer] developed an idea known as the Healthy Eating Plate. Just imagine a plate divided into logical, healthy portions.   The emphasis is on diet quality:   Load up on vegetables and fruits - one-half of your plate: Aim for color and variety, and remember that potatoes dont count.   Go for whole grains - one-quarter of your plate: Whole wheat, barley, wheat berries, quinoa, oats, brown rice, and foods made with them. If you want pasta, go with whole wheat pasta.   Protein power - one-quarter of your plate: Fish, chicken, beans, and nuts are all healthy, versatile protein sources. Limit red meat.   The diet, however, does go beyond the plate, offering a few other suggestions.   Use healthy plant oils, such as olive, canola, soy, corn, sunflower and peanut. Check the labels, and avoid partially hydrogenated oil, which have unhealthy trans fats.   If youre thirsty, drink water. Coffee and tea are good in moderation, but skip sugary drinks and limit milk and dairy  products to one or two daily servings.   The type of carbohydrate in the diet is more important than the amount. Some sources of carbohydrates, such as vegetables, fruits, whole grains, and beans-are healthier  than others.   Finally, stay active  Signed, Thomasene Ripple, DO  01/30/2020 11:12 PM    [facility]

## 2020-01-30 NOTE — Patient Instructions (Addendum)
Medication Instructions:  Your physician has recommended you make the following change in your medication:   START: hydrochlorothiazide 12.5 mg once weekly if systolic blood pressure greater than 160  *If you need a refill on your cardiac medications before your next appointment, please call your pharmacy*   Lab Work: None If you have labs (blood work) drawn today and your tests are completely normal, you will receive your results only by: Marland Kitchen MyChart Message (if you have MyChart) OR . A paper copy in the mail If you have any lab test that is abnormal or we need to change your treatment, we will call you to review the results.   Testing/Procedures: None   Follow-Up: At [facility], you and your health needs are our priority.  As part of our continuing mission to provide you with exceptional heart care, we have created designated Provider Care Teams.  These Care Teams include your primary Cardiologist (physician) and Advanced Practice Providers (APPs -  Physician Assistants and Nurse Practitioners) who all work together to provide you with the care you need, when you need it.  We recommend signing up for the patient portal called "MyChart".  Sign up information is provided on this After Visit Summary.  MyChart is used to connect with patients for Virtual Visits (Telemedicine).  Patients are able to view lab/test results, encounter notes, upcoming appointments, etc.  Non-urgent messages can be sent to your provider as well.   To learn more about what you can do with MyChart, go to [email or web address].    Your next appointment:   3 month(s)  The format for your next appointment:   In Person  Provider:   Thomasene Ripple, DO   Other Instructions

## 2020-02-12 DIAGNOSIS — Z8673 Personal history of transient ischemic attack (TIA), and cerebral infarction without residual deficits: Secondary | ICD-10-CM | POA: Diagnosis not present

## 2020-02-12 DIAGNOSIS — I10 Essential (primary) hypertension: Secondary | ICD-10-CM | POA: Diagnosis not present

## 2020-02-12 DIAGNOSIS — M810 Age-related osteoporosis without current pathological fracture: Secondary | ICD-10-CM | POA: Diagnosis not present

## 2020-02-12 DIAGNOSIS — M8580 Other specified disorders of bone density and structure, unspecified site: Secondary | ICD-10-CM | POA: Diagnosis not present

## 2020-02-12 DIAGNOSIS — Z6823 Body mass index (BMI) 23.0-23.9, adult: Secondary | ICD-10-CM | POA: Diagnosis not present

## 2020-02-12 DIAGNOSIS — E782 Mixed hyperlipidemia: Secondary | ICD-10-CM | POA: Diagnosis not present

## 2020-02-12 DIAGNOSIS — Z7901 Long term (current) use of anticoagulants: Secondary | ICD-10-CM | POA: Diagnosis not present

## 2020-02-12 DIAGNOSIS — Z0000 Encounter for general adult medical examination without abnormal findings: Secondary | ICD-10-CM | POA: Diagnosis not present

## 2020-02-14 DIAGNOSIS — I10 Essential (primary) hypertension: Secondary | ICD-10-CM | POA: Diagnosis not present

## 2020-02-14 DIAGNOSIS — M810 Age-related osteoporosis without current pathological fracture: Secondary | ICD-10-CM | POA: Diagnosis not present

## 2020-02-14 DIAGNOSIS — E7849 Other hyperlipidemia: Secondary | ICD-10-CM | POA: Diagnosis not present

## 2020-02-16 DIAGNOSIS — Z23 Encounter for immunization: Secondary | ICD-10-CM | POA: Diagnosis not present

## 2020-02-24 DIAGNOSIS — R5381 Other malaise: Secondary | ICD-10-CM | POA: Diagnosis not present

## 2020-02-24 DIAGNOSIS — R5383 Other fatigue: Secondary | ICD-10-CM | POA: Diagnosis not present

## 2020-03-01 DIAGNOSIS — D649 Anemia, unspecified: Secondary | ICD-10-CM | POA: Diagnosis not present

## 2020-03-01 DIAGNOSIS — Z7901 Long term (current) use of anticoagulants: Secondary | ICD-10-CM | POA: Diagnosis not present

## 2020-03-05 ENCOUNTER — Telehealth: Payer: Self-pay | Admitting: Cardiology

## 2020-03-05 NOTE — Telephone Encounter (Signed)
Pt c/o medication issue:  1. Name of Medication: carvedilol (COREG) 6.25 MG tablet  2. How are you currently taking this medication (dosage and times per day)? 1 tablet by mouth two times daily   3. Are you having a reaction (difficulty breathing--STAT)? Yes   4. What is your medication issue? Mardy is calling wanting to know if her systolic is 130 or lower if she can take a 1/2 a tablet in the morning instead of a whole. She states when her BP is 130 or lower and she takes a full tablet in the morning it makes her extremely dizzy to the point she has to sit down all day. Please advise.

## 2020-03-05 NOTE — Telephone Encounter (Signed)
I spoke with Mr. Roberta Mercer, I informed her it would be reasonable to reduce coreg by half when her morning SBP is 130 or below.

## 2020-03-29 DIAGNOSIS — Z7901 Long term (current) use of anticoagulants: Secondary | ICD-10-CM | POA: Diagnosis not present

## 2020-05-10 DIAGNOSIS — Z7901 Long term (current) use of anticoagulants: Secondary | ICD-10-CM | POA: Diagnosis not present

## 2020-05-11 ENCOUNTER — Ambulatory Visit (INDEPENDENT_AMBULATORY_CARE_PROVIDER_SITE_OTHER): Payer: Medicare Other | Admitting: Cardiology

## 2020-05-11 ENCOUNTER — Other Ambulatory Visit: Payer: Self-pay

## 2020-05-11 ENCOUNTER — Encounter: Payer: Self-pay | Admitting: Cardiology

## 2020-05-11 VITALS — BP 142/72 | HR 72 | Ht 67.0 in | Wt 142.6 lb

## 2020-05-11 DIAGNOSIS — I10 Essential (primary) hypertension: Secondary | ICD-10-CM

## 2020-05-11 DIAGNOSIS — I351 Nonrheumatic aortic (valve) insufficiency: Secondary | ICD-10-CM | POA: Diagnosis not present

## 2020-05-11 DIAGNOSIS — Z8673 Personal history of transient ischemic attack (TIA), and cerebral infarction without residual deficits: Secondary | ICD-10-CM

## 2020-05-11 DIAGNOSIS — N1831 Chronic kidney disease, stage 3a: Secondary | ICD-10-CM | POA: Diagnosis not present

## 2020-05-11 DIAGNOSIS — I350 Nonrheumatic aortic (valve) stenosis: Secondary | ICD-10-CM | POA: Diagnosis not present

## 2020-05-11 HISTORY — DX: Personal history of transient ischemic attack (TIA), and cerebral infarction without residual deficits: Z86.73

## 2020-05-11 NOTE — Progress Notes (Signed)
Cardiology Office Note:    Date:  05/11/2020   ID:  Roberta Mercer, DOB 09-Oct-1940, MRN [id number]  PCP:  Lise Auer, MD  Cardiologist:  Thomasene Ripple, DO  Electrophysiologist:  None   Referring MD: Lise Auer, MD     History of Present Illness:    Roberta Mercer is a 80 y.o. female with a hx of yperlipidemia, history of CVA she has had multiple CVAs. She was on Eliquis in the past but was placed back on Coumadin.  I did see the patient in October 21 at that time I placed the patient constantly on hydrochlorothiazide addition to her antihypertensive medication.  She has been taking her medication as prescribed.  She had skip Sundays given her orthostatic hypotension and her blood pressure being systolics in the 120s.  This is okay.  She recently has been diagnosed with osteoporosis.  Past Medical History:  Diagnosis Date  . CKD (chronic kidney disease)   . Diastolic dysfunction 06/05/2019  . DVT (deep venous thrombosis) (HCC)   . Esophageal spasm   . Essential hypertension 03/03/2019  . Essential tremor   . History of stroke 11/14/2014  . History of venous thromboembolism   . Hyperlipidemia   . LVH (left ventricular hypertrophy) 06/05/2019  . 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  . Post-menopausal atrophic vaginitis   . Stage 3a chronic kidney disease (HCC) 06/05/2019  . Stroke (HCC)   . TIA (transient ischemic attack)     Past Surgical History:  Procedure Laterality Date  . CATARACT EXTRACTION    . ELBOW SURGERY Left    to repair nerve/ not successful  . TRIGGER FINGER RELEASE      Current Medications: Current Meds  Medication Sig  . aspirin 325 MG tablet Take 325 mg by mouth daily.  Florian Buff, Borago officinalis, (BORAGE 1000 PO) Take by mouth.  . Calcium Carb-Cholecalciferol (CALCIUM 600 + D PO) Take by mouth daily.  . carvedilol (COREG) 6.25  MG tablet Take 1 tablet (6.25 mg total) by mouth 2 (two) times daily.  . cholecalciferol (VITAMIN D3) 25 MCG (1000 UT) tablet Take 1,000 Units by mouth daily.  . cyanocobalamin 1000 MCG tablet Take 1,000 mcg by mouth daily.  . folic acid (FOLVITE) 1 MG tablet Take 666 mg by mouth daily.   . hydrochlorothiazide (MICROZIDE) 12.5 MG capsule Take one tablet weekly if systolic blood pressure greater than 160  . levocetirizine (XYZAL) 5 MG tablet Take 5 mg by mouth every evening.  . Magnesium 250 MG TABS Take 250 mg by mouth daily.  . Omega-3 Fatty Acids (FISH OIL) 1000 MG CAPS Take 1,000 mg by mouth daily.   Marland Kitchen pyridOXINE (VITAMIN B-6) 100 MG tablet Take 100 mg by mouth daily.  Marland Kitchen thiamine 100 MG tablet Take 100 mg by mouth daily.  Marland Kitchen topiramate (TOPAMAX) 25 MG tablet 25 mg daily. Take 1 daily for tremors  . warfarin (COUMADIN) 1 MG tablet Take 0.5 mg by mouth once a week. Take 0.5 mg by mouth once a week on Friday.   . warfarin (COUMADIN) 2.5 MG tablet   . [DISCONTINUED] estradiol (ESTRACE) 0.1 MG/GM vaginal cream once a week.   . [DISCONTINUED] fexofenadine (ALLEGRA) 180 MG tablet Take 180 mg by mouth daily.  . [DISCONTINUED] medroxyPROGESTERone (PROVERA) 5 MG tablet Take 5 mg by mouth daily.     Allergies:   Bactrim [sulfamethoxazole-trimethoprim] and Diovan [valsartan]  Social History   Socioeconomic History  . Marital status: Single    Spouse name: Not on file  . Number of children: 0  . Years of education: Not on file  . Highest education level: Not on file  Occupational History  . Occupation: retired  Tobacco Use  . Smoking status: Former Smoker    Quit date: 1976    Years since quitting: 46.0  . Smokeless tobacco: Never Used  Substance and Sexual Activity  . Alcohol use: Never  . Drug use: Never  . Sexual activity: Not on file  Other Topics Concern  . Not on file  Social History Narrative   Lives alone   Right handed   Drinks no caffeine      Social Determinants of  Health   Financial Resource Strain: Not on file  Food Insecurity: Not on file  Transportation Needs: Not on file  Physical Activity: Not on file  Stress: Not on file  Social Connections: Not on file     Family History: The patient's family history includes Heart disease in her mother; Stroke in her maternal grandmother and mother; Vascular Disease in her maternal grandmother.  ROS:   Review of Systems  Constitution: Negative for decreased appetite, fever and weight gain.  HENT: Negative for congestion, ear discharge, hoarse voice and sore throat.   Eyes: Negative for discharge, redness, vision loss in right eye and visual halos.  Cardiovascular: Negative for chest pain, dyspnea on exertion, leg swelling, orthopnea and palpitations.  Respiratory: Negative for cough, hemoptysis, shortness of breath and snoring.   Endocrine: Negative for heat intolerance and polyphagia.  Hematologic/Lymphatic: Negative for bleeding problem. Does not bruise/bleed easily.  Skin: Negative for flushing, nail changes, rash and suspicious lesions.  Musculoskeletal: Negative for arthritis, joint pain, muscle cramps, myalgias, neck pain and stiffness.  Gastrointestinal: Negative for abdominal pain, bowel incontinence, diarrhea and excessive appetite.  Genitourinary: Negative for decreased libido, genital sores and incomplete emptying.  Neurological: Negative for brief paralysis, focal weakness, headaches and loss of balance.  Psychiatric/Behavioral: Negative for altered mental status, depression and suicidal ideas.  Allergic/Immunologic: Negative for HIV exposure and persistent infections.    EKGs/Labs/Other Studies Reviewed:    The following studies were reviewed today:   EKG: None today  Bilateral carotid artery duplex shows moderate to large amount of bilateral atherosclerotic plaque, left greater than right not resulting in hemodynamically significant stenosis within either internal carotid  artery.  Transthoracic echocardiogram done on August 08, 2019 there was mild concentric left hypertrophy. Overall left ventricular systolic function 60 to 65%. Impaired relaxation. Mild to moderate aortic regurgitation. Mild aortic stenosis. AVA by VTI 9.5 cm, mean gradient 22 mmHg. Mild mitral regurgitation. Mild to moderate tricuspid regurgitation. Mild pulmonary hypertension.  Recent Labs: 05/26/2019: BUN 32; Creatinine, Ser 1.15; Potassium 4.6; Sodium 140  Recent Lipid Panel    Component Value Date/Time   CHOL 143 01/20/2020 1049   TRIG 97 01/20/2020 1049   HDL 61 01/20/2020 1049   CHOLHDL 2.3 01/20/2020 1049   LDLCALC 64 01/20/2020 1049    Physical Exam:    VS:  BP (!) 142/72   Pulse 72   Ht 5\' 7"  (1.702 m)   Wt 142 lb 9.6 oz (64.7 kg)   SpO2 95%   BMI 22.33 kg/m     Wt Readings from Last 3 Encounters:  05/11/20 142 lb 9.6 oz (64.7 kg)  01/30/20 149 lb 9.6 oz (67.9 kg)  01/21/20 149 lb 12.8 oz (67.9 kg)  GEN: Well nourished, well developed in no acute distress HEENT: Normal NECK: No JVD; No carotid bruits LYMPHATICS: No lymphadenopathy CARDIAC: S1S2 noted,RRR, no murmurs, rubs, gallops RESPIRATORY:  Clear to auscultation without rales, wheezing or rhonchi  ABDOMEN: Soft, non-tender, non-distended, +bowel sounds, no guarding. EXTREMITIES: No edema, No cyanosis, no clubbing MUSCULOSKELETAL:  No deformity  SKIN: Warm and dry NEUROLOGIC:  Alert and oriented x 3, non-focal PSYCHIATRIC:  Normal affect, good insight  ASSESSMENT:    1. Essential hypertension   2. Nonrheumatic aortic valve insufficiency   3. Nonrheumatic aortic valve stenosis   4. Stage 3a chronic kidney disease (HCC)   5. History of CVA (cerebrovascular accident)    PLAN:     1.  Her blood pressure manually taken by me in the office today 142/72 mmHg.  When I change her medication regimen she will stay on the same regimen.  She is agreeable with this and I am okay with that given the  fact that the patient does have orthostatic hypotension as well.  2.  Her Coumadin is being managed by her primary care doctor for history of strokes.  She did get her INR checked today.  3.  Hyperlipidemia - continue with current statin medication.  The patient is in agreement with the above plan. The patient left the office in stable condition.  The patient will follow up in 6 months or sooner if needed.   Medication Adjustments/Labs and Tests Ordered: Current medicines are reviewed at length with the patient today.  Concerns regarding medicines are outlined above.  No orders of the defined types were placed in this encounter.  No orders of the defined types were placed in this encounter.   Patient Instructions  Medication Instructions:  Your physician recommends that you continue on your current medications as directed. Please refer to the Current Medication list given to you today.  *If you need a refill on your cardiac medications before your next appointment, please call your pharmacy*   Lab Work: None  If you have labs (blood work) drawn today and your tests are completely normal, you will receive your results only by: Marland Kitchen MyChart Message (if you have MyChart) OR . A paper copy in the mail If you have any lab test that is abnormal or we need to change your treatment, we will call you to review the results.   Testing/Procedures: None   Follow-Up: At [facility] ([place]), you and your health needs are our priority.  As part of our continuing mission to provide you with exceptional heart care, we have created designated Provider Care Teams.  These Care Teams include your primary Cardiologist (physician) and Advanced Practice Providers (APPs -  Physician Assistants and Nurse Practitioners) who all work together to provide you with the care you need, when you need it.  We recommend signing up for the patient portal called "MyChart".  Sign up information is provided on this After  Visit Summary.  MyChart is used to connect with patients for Virtual Visits (Telemedicine).  Patients are able to view lab/test results, encounter notes, upcoming appointments, etc.  Non-urgent messages can be sent to your provider as well.   To learn more about what you can do with MyChart, go to [email or web address].    Your next appointment:   6 month(s)  The format for your next appointment:   In Person  Provider:   Thomasene Ripple, DO   Other Instructions      Adopting a Healthy Lifestyle.  Know what  a healthy weight is for you (roughly BMI <25) and aim to maintain this   Aim for 7+ servings of fruits and vegetables daily   65-80+ fluid ounces of water or unsweet tea for healthy kidneys   Limit to max 1 drink of alcohol per day; avoid smoking/tobacco   Limit animal fats in diet for cholesterol and heart health - choose grass fed whenever available   Avoid highly processed foods, and foods high in saturated/trans fats   Aim for low stress - take time to unwind and care for your mental health   Aim for 150 min of moderate intensity exercise weekly for heart health, and weights twice weekly for bone health   Aim for 7-9 hours of sleep daily   When it comes to diets, agreement about the perfect plan isnt easy to find, even among the experts. Experts at the [facility] of [employer] developed an idea known as the Healthy Eating Plate. Just imagine a plate divided into logical, healthy portions.   The emphasis is on diet quality:   Load up on vegetables and fruits - one-half of your plate: Aim for color and variety, and remember that potatoes dont count.   Go for whole grains - one-quarter of your plate: Whole wheat, barley, wheat berries, quinoa, oats, brown rice, and foods made with them. If you want pasta, go with whole wheat pasta.   Protein power - one-quarter of your plate: Fish, chicken, beans, and nuts are all healthy, versatile protein sources. Limit red  meat.   The diet, however, does go beyond the plate, offering a few other suggestions.   Use healthy plant oils, such as olive, canola, soy, corn, sunflower and peanut. Check the labels, and avoid partially hydrogenated oil, which have unhealthy trans fats.   If youre thirsty, drink water. Coffee and tea are good in moderation, but skip sugary drinks and limit milk and dairy products to one or two daily servings.   The type of carbohydrate in the diet is more important than the amount. Some sources of carbohydrates, such as vegetables, fruits, whole grains, and beans-are healthier than others.   Finally, stay active  Signed, Thomasene Ripple, DO  05/11/2020 4:06 PM    [facility]

## 2020-05-11 NOTE — Patient Instructions (Signed)

## 2020-06-07 DIAGNOSIS — Z7901 Long term (current) use of anticoagulants: Secondary | ICD-10-CM | POA: Diagnosis not present

## 2020-07-05 ENCOUNTER — Telehealth: Payer: Self-pay | Admitting: Cardiology

## 2020-07-05 NOTE — Telephone Encounter (Signed)
Pt states that she was wanting advisement on using the [employer]. Pt states that it is recommendated to get the doctors advise if you have arrhythmias. How do you advise?

## 2020-07-05 NOTE — Telephone Encounter (Signed)
Patient was calling back about her results

## 2020-07-06 NOTE — Telephone Encounter (Signed)
That fine she can use it to help her back pain

## 2020-07-06 NOTE — Telephone Encounter (Signed)
Recommendation given to pt. Pt verbalized understanding and had no additional questions.

## 2020-07-12 DIAGNOSIS — Z7901 Long term (current) use of anticoagulants: Secondary | ICD-10-CM | POA: Diagnosis not present

## 2020-07-26 DIAGNOSIS — Z7901 Long term (current) use of anticoagulants: Secondary | ICD-10-CM | POA: Diagnosis not present

## 2020-08-02 DIAGNOSIS — Z7901 Long term (current) use of anticoagulants: Secondary | ICD-10-CM | POA: Diagnosis not present

## 2020-09-20 DIAGNOSIS — Z7901 Long term (current) use of anticoagulants: Secondary | ICD-10-CM | POA: Diagnosis not present

## 2020-10-04 DIAGNOSIS — D649 Anemia, unspecified: Secondary | ICD-10-CM | POA: Diagnosis not present

## 2020-10-04 DIAGNOSIS — Z7901 Long term (current) use of anticoagulants: Secondary | ICD-10-CM | POA: Diagnosis not present

## 2020-11-15 DIAGNOSIS — Z7901 Long term (current) use of anticoagulants: Secondary | ICD-10-CM | POA: Diagnosis not present

## 2020-12-06 DIAGNOSIS — D649 Anemia, unspecified: Secondary | ICD-10-CM | POA: Diagnosis not present

## 2020-12-06 DIAGNOSIS — Z7901 Long term (current) use of anticoagulants: Secondary | ICD-10-CM | POA: Diagnosis not present

## 2020-12-15 ENCOUNTER — Other Ambulatory Visit: Payer: Self-pay

## 2020-12-17 ENCOUNTER — Ambulatory Visit (INDEPENDENT_AMBULATORY_CARE_PROVIDER_SITE_OTHER): Payer: Medicare Other | Admitting: Cardiology

## 2020-12-17 ENCOUNTER — Encounter: Payer: Self-pay | Admitting: Cardiology

## 2020-12-17 ENCOUNTER — Other Ambulatory Visit: Payer: Self-pay

## 2020-12-17 VITALS — BP 140/74 | HR 72 | Ht 67.0 in | Wt 141.0 lb

## 2020-12-17 DIAGNOSIS — Z8673 Personal history of transient ischemic attack (TIA), and cerebral infarction without residual deficits: Secondary | ICD-10-CM | POA: Diagnosis not present

## 2020-12-17 DIAGNOSIS — I10 Essential (primary) hypertension: Secondary | ICD-10-CM | POA: Diagnosis not present

## 2020-12-17 DIAGNOSIS — I350 Nonrheumatic aortic (valve) stenosis: Secondary | ICD-10-CM

## 2020-12-17 DIAGNOSIS — R0602 Shortness of breath: Secondary | ICD-10-CM | POA: Diagnosis not present

## 2020-12-17 DIAGNOSIS — E782 Mixed hyperlipidemia: Secondary | ICD-10-CM

## 2020-12-17 NOTE — Patient Instructions (Addendum)
Medication Instructions:  Your physician recommends that you continue on your current medications as directed. Please refer to the Current Medication list given to you today.  *If you need a refill on your cardiac medications before your next appointment, please call your pharmacy*   Lab Work: None If you have labs (blood work) drawn today and your tests are completely normal, you will receive your results only by: MyChart Message (if you have MyChart) OR A paper copy in the mail If you have any lab test that is abnormal or we need to change your treatment, we will call you to review the results.   Testing/Procedures: Your physician has requested that you have an echocardiogram. Echocardiography is a painless test that uses sound waves to create images of your heart. It provides your doctor with information about the size and shape of your heart and how well your heart's chambers and valves are working. This procedure takes approximately one hour. There are no restrictions for this procedure.    Follow-Up: At [facility], you and your health needs are our priority.  As part of our continuing mission to provide you with exceptional heart care, we have created designated Provider Care Teams.  These Care Teams include your primary Cardiologist (physician) and Advanced Practice Providers (APPs -  Physician Assistants and Nurse Practitioners) who all work together to provide you with the care you need, when you need it.  We recommend signing up for the patient portal called "MyChart".  Sign up information is provided on this After Visit Summary.  MyChart is used to connect with patients for Virtual Visits (Telemedicine).  Patients are able to view lab/test results, encounter notes, upcoming appointments, etc.  Non-urgent messages can be sent to your provider as well.   To learn more about what you can do with MyChart, go to [email or web address].    Your next appointment:   6  month(s)  The format for your next appointment:   In Person  Provider:   Dr. Bing Matter    Other Instructions

## 2020-12-17 NOTE — Progress Notes (Signed)
Cardiology Office Note:    Date:  12/19/2020   ID:  Roberta Mercer, DOB [birth year], MRN [id number]  PCP:  Lise Auer, MD  Cardiologist:  Thomasene Ripple, DO  Electrophysiologist:  None   Referring MD: Lise Auer, MD   Chief Complaint  Patient presents with   Follow-up    History of Present Illness:    Roberta Mercer is a 80 y.o. female with a hx of hyperlipidemia, history of CVA she has had multiple CVAs.  She was on Eliquis in the past but was placed back on Coumadin.   I did see the patient in October 21 at that time I placed the patient constantly on hydrochlorothiazide addition to her antihypertensive medication.  She has been taking her medication as prescribed.  She had skip Sundays given her orthostatic hypotension and her blood pressure being systolics in the 120s.  This is okay  I saw the patient May 11, 2020 at that time she was doing well.  Since I last saw the patient she has not had any hospitalizations or any ER visit.  She does tell me though she is experiencing worsening shortness of breath.  She notes that she is unable to stand long even when she is making her meals she gets short of breath.  She tells me when she is singing at church at times with the choir she has to sit down as she does get short of breath.  No chest pain.   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   aspirin EC 81 MG tablet Take 81 mg by mouth 2 (two) times daily. Swallow whole.   Borage, Borago officinalis, (BORAGE 1000 PO) Take 1 tablet by mouth daily.   Calcium Carb-Cholecalciferol (CALCIUM 600 + D PO) Take 1 tablet by mouth in the morning and at bedtime.   carvedilol (COREG) 6.25 MG tablet Take 6.25 mg by mouth 2 (two) times daily as needed (BP Greater than 140 Systolic).   cholecalciferol (VITAMIN D3) 25 MCG (1000 UT) tablet Take 1,000 Units by mouth daily.   COLLAGEN PO Take 2,000 mg by mouth 2 (two) times daily.   cyanocobalamin 1000 MCG tablet Take 1,000 mcg by mouth 2 (two) times daily.   ferrous sulfate 325 (65 FE) MG EC tablet Take 325 mg by mouth daily.   folic acid (FOLVITE) 400 MCG tablet Take 400 mcg by mouth 2 (two) times daily.   hydrochlorothiazide (HYDRODIURIL) 12.5 MG tablet Take 12.5  mg by mouth daily.   levocetirizine (XYZAL) 5 MG tablet Take 5 mg by mouth every evening.   Magnesium 250 MG TABS Take 250 mg by mouth 2 (two) times daily.   Omega-3 Fatty Acids (FISH OIL) 1000 MG CAPS Take 1,000 mg by mouth daily.    pyridOXINE (VITAMIN B-6) 100 MG tablet Take 100 mg by mouth daily.   rosuvastatin (CRESTOR) 20 MG tablet Take 20 mg by mouth daily.   thiamine 100 MG tablet Take 100 mg by mouth daily.   topiramate (TOPAMAX) 25 MG tablet Take 25 mg by mouth daily.   warfarin (COUMADIN) 1 MG tablet Take 0.5 mg by mouth once a week. Take 0.5 mg by mouth once a week on Friday.    warfarin (COUMADIN) 2.5 MG tablet Take 2.5 mg by mouth daily.     Allergies:   Bactrim [sulfamethoxazole-trimethoprim] and Diovan [valsartan]    Social History   Socioeconomic History   Marital status: Single    Spouse name: Not on file   Number of children: 0   Years of education: Not on file   Highest education level: Not on file  Occupational History   Occupation: retired  Tobacco Use   Smoking status: Former    Types: Cigarettes    Quit date: 1976    Years since quitting: 46.7   Smokeless tobacco: Never  Substance and Sexual Activity   Alcohol use: Never   Drug use: Never   Sexual activity: Not on file  Other Topics Concern   Not on file  Social History Narrative   Lives alone   Right handed   Drinks no caffeine      Social Determinants of Health   Financial Resource Strain: Not on file  Food Insecurity: Not on file  Transportation Needs: Not on file  Physical Activity: Not on file  Stress: Not on file  Social Connections: Not on file     Family History: The patient's family history includes Heart disease in her mother; Stroke in her maternal grandmother and mother; Vascular Disease in her maternal grandmother.  ROS:   Review of Systems  Constitution: Negative for decreased appetite, fever and weight gain.  HENT: Negative for congestion, ear discharge, hoarse voice and sore throat.   Eyes: Negative for discharge, redness, vision loss in right eye and visual halos.  Cardiovascular: Negative for chest pain, dyspnea on exertion, leg swelling, orthopnea and palpitations.  Respiratory: Negative for cough, hemoptysis, shortness of breath and snoring.   Endocrine: Negative for heat intolerance and polyphagia.  Hematologic/Lymphatic: Negative for bleeding problem. Does not bruise/bleed easily.  Skin: Negative for flushing, nail changes, rash and suspicious lesions.  Musculoskeletal: Negative for arthritis, joint pain, muscle cramps, myalgias, neck pain and stiffness.  Gastrointestinal: Negative for abdominal pain, bowel incontinence, diarrhea and excessive appetite.  Genitourinary: Negative for decreased  libido, genital sores and incomplete emptying.  Neurological: Negative for brief paralysis, focal weakness, headaches and loss of balance.  Psychiatric/Behavioral: Negative for altered mental status, depression and suicidal ideas.  Allergic/Immunologic: Negative for HIV exposure and persistent infections.    EKGs/Labs/Other Studies Reviewed:    The following studies were reviewed today:    EKG:  The ekg ordered today demonstrates sinus rhythm, heart rate 72 bpm.  Recent Labs: No results found for requested labs within last 8760 hours.  Recent Lipid Panel    Component Value Date/Time   CHOL 143 01/20/2020 1049   TRIG 97 01/20/2020 1049   HDL 61 01/20/2020 1049  CHOLHDL 2.3 01/20/2020 1049   LDLCALC 64 01/20/2020 1049    Physical Exam:    VS:  BP 140/74 (BP Location: Left Arm, Patient Position: Sitting, Cuff Size: Normal)   Pulse 72   Ht 5\' 7"  (1.702 m)   Wt 141 lb (64 kg)   SpO2 93%   BMI 22.08 kg/m     Wt Readings from Last 3 Encounters:  12/17/20 141 lb (64 kg)  05/11/20 142 lb 9.6 oz (64.7 kg)  01/30/20 149 lb 9.6 oz (67.9 kg)     GEN: Well nourished, well developed in no acute distress HEENT: Normal NECK: No JVD; No carotid bruits LYMPHATICS: No lymphadenopathy CARDIAC: S1S2 noted,RRR, 2 out of 6 mid ejection systolic murmurs, rubs, gallops RESPIRATORY:  Clear to auscultation without rales, wheezing or rhonchi  ABDOMEN: Soft, non-tender, non-distended, +bowel sounds, no guarding. EXTREMITIES: No edema, No cyanosis, no clubbing MUSCULOSKELETAL:  No deformity  SKIN: Warm and dry NEUROLOGIC:  Alert and oriented x 3, non-focal PSYCHIATRIC:  Normal affect, good insight  ASSESSMENT:    1. Essential hypertension   2. Aortic valve stenosis, etiology of cardiac valve disease unspecified   3. Shortness of breath   4. History of CVA (cerebrovascular accident)   5. Mixed hyperlipidemia    PLAN:     Her shortness of breath is concerning thankfully she does not  have any leg edema clinically she does not appear to be heart failure exacerbation.  But she does have some valvular abnormalities on her previous echocardiogram which was done earlier [facility] showing mild aortic stenosis with mild to moderate tricuspid regurgitation and mild to moderate aortic regurgitation.  I like to repeat her echocardiogram to make sure that there is no worsening of her valvular dysfunction.  We will hold off on adding any additional diuretics at this time.  She will remain on hydrochlorothiazide 12.5 that she already takes for her hypertension.  We will continue to reassess clinically the need for any additional diuretic therapy.  Her blood pressure is acceptable.  I have reviewed her home blood pressure reading which she takes every day and has been doing since the last time I saw her.  There is evidence of intermittent hypotension where she would hold her evening carvedilol.  Her anticoagulation is being managed by her primary care provider.  This is for her recurrent strokes.  She also follows neurology.   The patient is in agreement with the above plan. The patient left the office in stable condition.  The patient will follow up in 6 months with Dr. 02/01/20.   Medication Adjustments/Labs and Tests Ordered: Current medicines are reviewed at length with the patient today.  Concerns regarding medicines are outlined above.  Orders Placed This Encounter  Procedures   EKG 12-Lead   ECHOCARDIOGRAM COMPLETE   No orders of the defined types were placed in this encounter.   Patient Instructions  Medication Instructions:  Your physician recommends that you continue on your current medications as directed. Please refer to the Current Medication list given to you today.  *If you need a refill on your cardiac medications before your next appointment, please call your pharmacy*   Lab Work: None If you have labs (blood work) drawn today and your tests are  completely normal, you will receive your results only by: MyChart Message (if you have MyChart) OR A paper copy in the mail If you have any lab test that is abnormal or we need to change  your treatment, we will call you to review the results.   Testing/Procedures: Your physician has requested that you have an echocardiogram. Echocardiography is a painless test that uses sound waves to create images of your heart. It provides your doctor with information about the size and shape of your heart and how well your heart's chambers and valves are working. This procedure takes approximately one hour. There are no restrictions for this procedure.    Follow-Up: At [facility], you and your health needs are our priority.  As part of our continuing mission to provide you with exceptional heart care, we have created designated Provider Care Teams.  These Care Teams include your primary Cardiologist (physician) and Advanced Practice Providers (APPs -  Physician Assistants and Nurse Practitioners) who all work together to provide you with the care you need, when you need it.  We recommend signing up for the patient portal called "MyChart".  Sign up information is provided on this After Visit Summary.  MyChart is used to connect with patients for Virtual Visits (Telemedicine).  Patients are able to view lab/test results, encounter notes, upcoming appointments, etc.  Non-urgent messages can be sent to your provider as well.   To learn more about what you can do with MyChart, go to [email or web address].    Your next appointment:   6 month(s)  The format for your next appointment:   In Person  Provider:   Dr. Bing Matter    Other Instructions    Adopting a Healthy Lifestyle.  Know what a healthy weight is for you (roughly BMI <25) and aim to maintain this   Aim for 7+ servings of fruits and vegetables daily   65-80+ fluid ounces of water or unsweet tea for healthy kidneys   Limit to max 1  drink of alcohol per day; avoid smoking/tobacco   Limit animal fats in diet for cholesterol and heart health - choose grass fed whenever available   Avoid highly processed foods, and foods high in saturated/trans fats   Aim for low stress - take time to unwind and care for your mental health   Aim for 150 min of moderate intensity exercise weekly for heart health, and weights twice weekly for bone health   Aim for 7-9 hours of sleep daily   When it comes to diets, agreement about the perfect plan isnt easy to find, even among the experts. Experts at the [facility] [employer] developed an idea known as the Healthy Eating Plate. Just imagine a plate divided into logical, healthy portions.   The emphasis is on diet quality:   Load up on vegetables and fruits - one-half of your plate: Aim for color and variety, and remember that potatoes dont count.   Go for whole grains - one-quarter of your plate: Whole wheat, barley, wheat berries, quinoa, oats, brown rice, and foods made with them. If you want pasta, go with whole wheat pasta.   Protein power - one-quarter of your plate: Fish, chicken, beans, and nuts are all healthy, versatile protein sources. Limit red meat.   The diet, however, does go beyond the plate, offering a few other suggestions.   Use healthy plant oils, such as olive, canola, soy, corn, sunflower and peanut. Check the labels, and avoid partially hydrogenated oil, which have unhealthy trans fats.   If youre thirsty, drink water. Coffee and tea are good in moderation, but skip sugary drinks and limit milk and dairy products to one or two daily servings.  The type of carbohydrate in the diet is more important than the amount. Some sources of carbohydrates, such as vegetables, fruits, whole grains, and beans-are healthier than others.   Finally, stay active  Signed, Thomasene RippleKardie Akari Defelice, DO  12/19/2020 1:06 PM    [facility]

## 2020-12-31 ENCOUNTER — Other Ambulatory Visit: Payer: Self-pay

## 2020-12-31 ENCOUNTER — Ambulatory Visit (INDEPENDENT_AMBULATORY_CARE_PROVIDER_SITE_OTHER): Payer: Medicare Other

## 2020-12-31 DIAGNOSIS — R0602 Shortness of breath: Secondary | ICD-10-CM | POA: Diagnosis not present

## 2020-12-31 DIAGNOSIS — I350 Nonrheumatic aortic (valve) stenosis: Secondary | ICD-10-CM

## 2020-12-31 LAB — ECHOCARDIOGRAM COMPLETE
AR max vel: 1 cm2
AV Area VTI: 0.99 cm2
AV Area mean vel: 0.97 cm2
AV Mean grad: 24 mmHg
AV Peak grad: 40.8 mmHg
Ao pk vel: 3.2 m/s
Area-P 1/2: 3.61 cm2
P 1/2 time: 327 msec
S' Lateral: 2.8 cm

## 2021-01-06 ENCOUNTER — Ambulatory Visit (INDEPENDENT_AMBULATORY_CARE_PROVIDER_SITE_OTHER): Payer: Medicare Other | Admitting: Cardiology

## 2021-01-06 ENCOUNTER — Other Ambulatory Visit: Payer: Self-pay

## 2021-01-06 ENCOUNTER — Encounter: Payer: Self-pay | Admitting: Cardiology

## 2021-01-06 VITALS — BP 158/90 | HR 82 | Ht 66.0 in | Wt 144.4 lb

## 2021-01-06 DIAGNOSIS — E782 Mixed hyperlipidemia: Secondary | ICD-10-CM | POA: Diagnosis not present

## 2021-01-06 DIAGNOSIS — G459 Transient cerebral ischemic attack, unspecified: Secondary | ICD-10-CM

## 2021-01-06 DIAGNOSIS — I10 Essential (primary) hypertension: Secondary | ICD-10-CM | POA: Diagnosis not present

## 2021-01-06 DIAGNOSIS — R0602 Shortness of breath: Secondary | ICD-10-CM | POA: Diagnosis not present

## 2021-01-06 DIAGNOSIS — I351 Nonrheumatic aortic (valve) insufficiency: Secondary | ICD-10-CM

## 2021-01-06 DIAGNOSIS — Z8673 Personal history of transient ischemic attack (TIA), and cerebral infarction without residual deficits: Secondary | ICD-10-CM | POA: Diagnosis not present

## 2021-01-06 DIAGNOSIS — I350 Nonrheumatic aortic (valve) stenosis: Secondary | ICD-10-CM | POA: Diagnosis not present

## 2021-01-06 NOTE — Progress Notes (Signed)
Cardiology Office Note:    Date:  01/06/2021   ID:  Roberta Mercer, DOB 05/20/1940, MRN [id number]  PCP:  Lise Auer, MD  Cardiologist:  Thomasene Ripple, DO  Electrophysiologist:  None   Referring MD: Lise Auer, MD   " I am still short of breath"  History of Present Illness:    Roberta Mercer is a 80 y.o. female with a hx of hyperlipidemia, history of CVA she has had multiple CVAs.  She was on Eliquis in the past but was placed back on Coumadin.   I did see the patient in October 21 at that time I placed the patient constantly on hydrochlorothiazide addition to her antihypertensive medication.  She has been taking her medication as prescribed.  She had skip Sundays given her orthostatic hypotension and her blood pressure being systolics in the 120s.  This is okay.  I saw the patient May 11, 2020 at that time she was doing well.  At her visit on December 18, 2018 the patient told me that she had been experiencing significant shortness of breath.  She told me that she was able to tolerate standing long times.  That is making her meals worse causing her problem shortness of breath.  She injured sinks in a choir and she was experiencing significant shortness of breath when she would be single.  No chest pain.  Given that knowing the patient did have aortic stenosis I repeat her echocardiogram.  She is here today to discuss the echocardiogram result. She is still experiencing shortness of breath.  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   aspirin EC 81 MG tablet Take 81 mg by mouth 2 (two) times daily. Swallow whole.   Borage, Borago officinalis, (BORAGE 1000 PO) Take 1 tablet by mouth daily.   Calcium Carb-Cholecalciferol (CALCIUM 600 + D PO) Take 1 tablet by mouth in the morning and at bedtime.   carvedilol (COREG) 6.25 MG tablet Take 6.25 mg by mouth 2 (two) times daily as needed (BP Greater than 160). If BP is 150-159 patient takes 1/2 tablet BID   cholecalciferol (VITAMIN D3) 25 MCG (1000 UT) tablet Take 1,000 Units by mouth daily.   COLLAGEN PO Take 2,000 mg by mouth 2 (two) times daily.   cyanocobalamin 1000 MCG tablet Take 1,000 mcg by mouth 2 (two) times daily.   ferrous sulfate 325 (65 FE) MG EC tablet Take 325 mg by mouth daily.   folic acid (  FOLVITE) 400 MCG tablet Take 400 mcg by mouth 2 (two) times daily.   levocetirizine (XYZAL) 5 MG tablet Take 5 mg by mouth every evening.   Magnesium 250 MG TABS Take 250 mg by mouth 2 (two) times daily.   Omega-3 Fatty Acids (FISH OIL) 1000 MG CAPS Take 1,000 mg by mouth daily.    pyridOXINE (VITAMIN B-6) 100 MG tablet Take 100 mg by mouth daily.   rosuvastatin (CRESTOR) 20 MG tablet Take 20 mg by mouth daily.   thiamine 100 MG tablet Take 100 mg by mouth daily.   topiramate (TOPAMAX) 25 MG tablet Take 25 mg by mouth daily.   warfarin (COUMADIN) 1 MG tablet Take 0.5 mg by mouth once a week. Alternating on Friday and Tuesdays   warfarin (COUMADIN) 2.5 MG tablet Take 2.5 mg  by mouth daily.     Allergies:   Bactrim [sulfamethoxazole-trimethoprim] and Diovan [valsartan]   Social History   Socioeconomic History   Marital status: Single    Spouse name: Not on file   Number of children: 0   Years of education: Not on file   Highest education level: Not on file  Occupational History   Occupation: retired  Tobacco Use   Smoking status: Former    Types: Cigarettes    Quit date: 1976    Years since quitting: 46.7   Smokeless tobacco: Never  Substance and Sexual Activity   Alcohol use: Never   Drug use: Never   Sexual activity: Not on file  Other Topics Concern   Not on file  Social History Narrative   Lives alone   Right handed   Drinks no caffeine      Social Determinants of Health   Financial Resource Strain: Not on file  Food Insecurity: Not on file  Transportation Needs: Not on file  Physical Activity: Not on file  Stress: Not on file  Social Connections: Not on file     Family History: The patient's family history includes Heart disease in her mother; Stroke in her maternal grandmother and mother; Vascular Disease in her maternal grandmother.  ROS:   Review of Systems  Constitution: Negative for decreased appetite, fever and weight gain.  HENT: Negative for congestion, ear discharge, hoarse voice and sore throat.   Eyes: Negative for discharge, redness, vision loss in right eye and visual halos.  Cardiovascular: Negative for chest pain, dyspnea on exertion, leg swelling, orthopnea and palpitations.  Respiratory: Negative for cough, hemoptysis, shortness of breath and snoring.   Endocrine: Negative for heat intolerance and polyphagia.  Hematologic/Lymphatic: Negative for bleeding problem. Does not bruise/bleed easily.  Skin: Negative for flushing, nail changes, rash and suspicious lesions.  Musculoskeletal: Negative for arthritis, joint pain, muscle cramps, myalgias, neck pain and stiffness.  Gastrointestinal: Negative for abdominal  pain, bowel incontinence, diarrhea and excessive appetite.  Genitourinary: Negative for decreased libido, genital sores and incomplete emptying.  Neurological: Negative for brief paralysis, focal weakness, headaches and loss of balance.  Psychiatric/Behavioral: Negative for altered mental status, depression and suicidal ideas.  Allergic/Immunologic: Negative for HIV exposure and persistent infections.    EKGs/Labs/Other Studies Reviewed:    The following studies were reviewed today:   EKG:  None today  TTE 12/31/2020 IMPRESSIONS   1. Left ventricular ejection fraction, by estimation, is 60 to 65%. The left ventricle has normal function. The left ventricle has no regional wall motion abnormalities. There is severe concentric left ventricular hypertrophy. Left ventricular diastolic   parameters are consistent with  Grade I diastolic dysfunction (impaired relaxation).   2. Right ventricular systolic function is normal. The right ventricular size is normal. There is mildly elevated pulmonary artery systolic pressure.   3. The mitral valve is normal in structure. Mild mitral valve regurgitation- there appears to be to seperate mitral regurgitant jet in  late systole (one centrally the other at the base of the posterior leaflet). May benefit from a TEE for further  assessement. No evidence of mitral stenosis.   4. The aortic valve is calcified. Aortic valve regurgitation is moderate. Moderate aortic valve stenosis. Aortic valve area, by VTI measures 0.99 cm. Aortic valve mean gradient measures 24.0 mmHg. Aortic valve Vmax measures 3.20 m/s.   5. The inferior vena cava is normal in size with greater than 50% respiratory variability, suggesting right atrial pressure of 3 mmHg.   6. A small pericardial effusion is present. The pericardial effusion is circumferential.   FINDINGS   Left Ventricle: Left ventricular ejection fraction, by estimation, is 60  to 65%. The left ventricle has normal function.  The left ventricle has no  regional wall motion abnormalities. Global longitudinal strain performed  but not reported based on  interpreter judgement due to suboptimal tracking. The left ventricular  internal cavity size was normal in size. There is severe concentric left  ventricular hypertrophy. Left ventricular diastolic parameters are  consistent with Grade I diastolic  dysfunction (impaired relaxation).   Right Ventricle: The right ventricular size is normal. No increase in right ventricular wall thickness. Right ventricular systolic function is normal. There is mildly elevated pulmonary artery systolic pressure. The tricuspid regurgitant velocity is 2.93   m/s, and with an assumed right atrial pressure of 3 mmHg, the estimated right ventricular systolic pressure is 37.3 mmHg.   Left Atrium: Left atrial size was normal in size.   Right Atrium: Right atrial size was normal in size.   Pericardium: A small pericardial effusion is present. The pericardial effusion is circumferential.   Mitral Valve: The mitral valve is normal in structure. Mild mitral valve  regurgitation. No evidence of mitral valve stenosis.   Tricuspid Valve: The tricuspid valve is normal in structure. Tricuspid  valve regurgitation is trivial. No evidence of tricuspid stenosis.   Aortic Valve: The aortic valve is calcified. Aortic valve regurgitation is moderate. Aortic regurgitation PHT measures 327 msec. Moderate aortic stenosis is present. Aortic valve mean gradient measures 24.0 mmHg. Aortic valve peak gradient measures 40.8  mmHg. Aortic valve area, by VTI measures 0.99 cm.   Pulmonic Valve: The pulmonic valve was normal in structure. Pulmonic valve regurgitation is not visualized. No evidence of pulmonic stenosis.   Aorta: The aortic root is normal in size and structure.   Venous: The inferior vena cava is normal in size with greater than 50% respiratory variability, suggesting right atrial pressure of 3  mmHg.   IAS/Shunts: No atrial level shunt detected by color flow Doppler.    Recent Labs: No results found for requested labs within last 8760 hours.  Recent Lipid Panel    Component Value Date/Time   CHOL 143 01/20/2020 1049   TRIG 97 01/20/2020 1049   HDL 61 01/20/2020 1049   CHOLHDL 2.3 01/20/2020 1049   LDLCALC 64 01/20/2020 1049    Physical Exam:    VS:  BP (!) 158/90   Pulse 82   Ht 5\' 6"  (1.676 m)   Wt 144 lb 6.4 oz (65.5 kg)   SpO2 95%   BMI 23.31 kg/m  Wt Readings from Last 3 Encounters:  01/06/21 144 lb 6.4 oz (65.5 kg)  12/17/20 141 lb (64 kg)  05/11/20 142 lb 9.6 oz (64.7 kg)     GEN: Well nourished, well developed in no acute distress HEENT: Normal NECK: No JVD; No carotid bruits LYMPHATICS: No lymphadenopathy CARDIAC: S1S2 noted,RRR, no murmurs, rubs, gallops RESPIRATORY:  Clear to auscultation without rales, wheezing or rhonchi  ABDOMEN: Soft, non-tender, non-distended, +bowel sounds, no guarding. EXTREMITIES: No edema, No cyanosis, no clubbing MUSCULOSKELETAL:  No deformity  SKIN: Warm and dry NEUROLOGIC:  Alert and oriented x 3, non-focal PSYCHIATRIC:  Normal affect, good insight  ASSESSMENT:    1. Moderate aortic stenosis   2. Moderate aortic regurgitation   3. SOB (shortness of breath)   4. Essential hypertension   5. TIA (transient ischemic attack)   6. Mixed hyperlipidemia   7. History of CVA (cerebrovascular accident)    PLAN:     We talked about her echocardiogram result which show evidence of mixed aortic valve disease.  Given the fact that she still is experiencing symptoms with shortness of breath I like to refer the patient to our valve team for further evaluation.  For right now we will hold off any further testing and will defer to our valve team if needed for any further diagnostic testing.  No medication changes will be done today.  I reviewed her blood pressure from home which is averaging in the 130s to 140s.  She does  have longstanding orthostatic hypotension we will not increase any blood pressure medication today.  History of multiple strokes in the past-continue her Coumadin which is being managed by her primary care doctor.  She also follows with neurology.  The patient is in agreement with the above plan. The patient left the office in stable condition.  The patient will follow up with Dr. Bing Matter as a transition to our [facility] office.   Medication Adjustments/Labs and Tests Ordered: Current medicines are reviewed at length with the patient today.  Concerns regarding medicines are outlined above.  Orders Placed This Encounter  Procedures   Ambulatory referral to Structural Heart/Valve Clinic (only at [facility])   No orders of the defined types were placed in this encounter.   Patient Instructions  Medication Instructions:  Your physician recommends that you continue on your current medications as directed. Please refer to the Current Medication list given to you today.  *If you need a refill on your cardiac medications before your next appointment, please call your pharmacy*   Lab Work: None If you have labs (blood work) drawn today and your tests are completely normal, you will receive your results only by: MyChart Message (if you have MyChart) OR A paper copy in the mail If you have any lab test that is abnormal or we need to change your treatment, we will call you to review the results.   Testing/Procedures: None   Follow-Up: At [facility], you and your health needs are our priority.  As part of our continuing mission to provide you with exceptional heart care, we have created designated Provider Care Teams.  These Care Teams include your primary Cardiologist (physician) and Advanced Practice Providers (APPs -  Physician Assistants and Nurse Practitioners) who all work together to provide you with the care you need, when you need it.  We recommend signing up for the patient  portal called "MyChart".  Sign up information is provided on this After Visit Summary.  MyChart is used  to connect with patients for Virtual Visits (Telemedicine).  Patients are able to view lab/test results, encounter notes, upcoming appointments, etc.  Non-urgent messages can be sent to your provider as well.   To learn more about what you can do with MyChart, go to [email or web address].    Your next appointment:   3 month(s)  The format for your next appointment:   In Person  Provider:   Gypsy Balsam, MD   Other Instructions     Adopting a Healthy Lifestyle.  Know what a healthy weight is for you (roughly BMI <25) and aim to maintain this   Aim for 7+ servings of fruits and vegetables daily   65-80+ fluid ounces of water or unsweet tea for healthy kidneys   Limit to max 1 drink of alcohol per day; avoid smoking/tobacco   Limit animal fats in diet for cholesterol and heart health - choose grass fed whenever available   Avoid highly processed foods, and foods high in saturated/trans fats   Aim for low stress - take time to unwind and care for your mental health   Aim for 150 min of moderate intensity exercise weekly for heart health, and weights twice weekly for bone health   Aim for 7-9 hours of sleep daily   When it comes to diets, agreement about the perfect plan isnt easy to find, even among the experts. Experts at the [facility] of [employer] developed an idea known as the Healthy Eating Plate. Just imagine a plate divided into logical, healthy portions.   The emphasis is on diet quality:   Load up on vegetables and fruits - one-half of your plate: Aim for color and variety, and remember that potatoes dont count.   Go for whole grains - one-quarter of your plate: Whole wheat, barley, wheat berries, quinoa, oats, brown rice, and foods made with them. If you want pasta, go with whole wheat pasta.   Protein power - one-quarter of your plate: Fish,  chicken, beans, and nuts are all healthy, versatile protein sources. Limit red meat.   The diet, however, does go beyond the plate, offering a few other suggestions.   Use healthy plant oils, such as olive, canola, soy, corn, sunflower and peanut. Check the labels, and avoid partially hydrogenated oil, which have unhealthy trans fats.   If youre thirsty, drink water. Coffee and tea are good in moderation, but skip sugary drinks and limit milk and dairy products to one or two daily servings.   The type of carbohydrate in the diet is more important than the amount. Some sources of carbohydrates, such as vegetables, fruits, whole grains, and beans-are healthier than others.   Finally, stay active  Signed, Thomasene Ripple, DO  01/06/2021 5:33 PM    [facility]

## 2021-01-06 NOTE — Patient Instructions (Signed)

## 2021-01-10 DIAGNOSIS — Z7901 Long term (current) use of anticoagulants: Secondary | ICD-10-CM | POA: Diagnosis not present

## 2021-01-12 DIAGNOSIS — H35372 Puckering of macula, left eye: Secondary | ICD-10-CM | POA: Diagnosis not present

## 2021-01-20 ENCOUNTER — Ambulatory Visit: Payer: Medicare Other | Admitting: Neurology

## 2021-01-24 ENCOUNTER — Encounter: Payer: Self-pay | Admitting: Cardiovascular Disease

## 2021-01-24 ENCOUNTER — Ambulatory Visit (INDEPENDENT_AMBULATORY_CARE_PROVIDER_SITE_OTHER): Payer: Medicare Other | Admitting: Cardiovascular Disease

## 2021-01-24 ENCOUNTER — Other Ambulatory Visit: Payer: Self-pay

## 2021-01-24 VITALS — BP 180/100 | HR 79 | Wt 147.6 lb

## 2021-01-24 DIAGNOSIS — I350 Nonrheumatic aortic (valve) stenosis: Secondary | ICD-10-CM | POA: Diagnosis not present

## 2021-01-24 DIAGNOSIS — I351 Nonrheumatic aortic (valve) insufficiency: Secondary | ICD-10-CM | POA: Diagnosis not present

## 2021-01-24 DIAGNOSIS — Z01810 Encounter for preprocedural cardiovascular examination: Secondary | ICD-10-CM

## 2021-01-24 DIAGNOSIS — R0602 Shortness of breath: Secondary | ICD-10-CM

## 2021-01-24 NOTE — Patient Instructions (Addendum)
Medication Instructions:  Your physician recommends that you continue on your current medications as directed. Please refer to the Current Medication list given to you today.  *If you need a refill on your cardiac medications before your next appointment, please call your pharmacy*   Lab Work: CBC, CMET, BNP, Troponin  Your physician recommends that you return for lab work in: this week.   If you have labs (blood work) drawn today and your tests are completely normal, you will receive your results only by: MyChart Message (if you have MyChart) OR A paper copy in the mail If you have any lab test that is abnormal or we need to change your treatment, we will call you to review the results.   Testing/Procedures: CT .Marland Kitchen. will send you an appointment and instructions   Follow-Up: At [facility], you and your health needs are our priority.  As part of our continuing mission to provide you with exceptional heart care, we have created designated Provider Care Teams.  These Care Teams include your primary Cardiologist (physician) and Advanced Practice Providers (APPs -  Physician Assistants and Nurse Practitioners) who all work together to provide you with the care you need, when you need it.  We recommend signing up for the patient portal called "MyChart".  Sign up information is provided on this After Visit Summary.  MyChart is used to connect with patients for Virtual Visits (Telemedicine).  Patients are able to view lab/test results, encounter notes, upcoming appointments, etc.  Non-urgent messages can be sent to your provider as well.   To learn more about what you can do with MyChart, go to [email or web address].    Your next appointment:   1 year(s)  The format for your next appointment:   In Person  Provider:   Tonny Bollman, MD   Other Instructions  Pre-TAVR Testing:  You will be called to scheduled pre-TAVR CT scans. They will take place at [facility] (Main  Entrance A, Valet Parking). Please check in 30 minutes prior to your appointment time in Radiology (first floor).   Please follow these instructions carefully:  On the Night Before the Test: Be sure to Drink plenty of water. Do not consume any caffeinated/decaffeinated beverages or chocolate 12 hours prior to your test. Do not take any antihistamines 12 hours prior to your test.  On the Day of the Test: Drink plenty of water until 1 hour prior to the test. Do not eat any food 4 hours prior to the test. You may take your regular medications prior to the test EXCEPT: HOLD Hydrochlorothiazide morning of the test Take Carvedilol 12.5 mg the morning of your CT scans only FEMALES- please wear underwire-free bra if available  After the Test: Drink plenty of water. After receiving IV contrast, you may experience a mild flushed feeling. This is normal. On occasion, you may experience a mild rash up to 24 hours after the test. This is not dangerous. If this occurs, you can take Benadryl 25 mg and increase your fluid intake. If you experience trouble breathing, this can be serious. If it is severe call 911 IMMEDIATELY. If it is mild, please call our office.

## 2021-01-24 NOTE — Progress Notes (Signed)
[facility]   MULTIDISCIPLINARY HEART VALVE TEAM  Date:  02/01/2021   ID:  Roberta Mercer, DOB 01/02/1941, MRN [id number]  PCP:  Lise Auer, MD   CC: Shortness of Breath    HISTORY OF PRESENT ILLNESS: Roberta Mercer is a 80 y.o. female who presents for evaluation of aortic stenosis, referred by Dr Servando Salina  The patient has been noted to have a heart murmur and has been followed for aortic stenosis with serial echo studies. In 2020 an echo showed an LVEF of 55% and a mean transaortic gradient of 27 mmHg. A recent follow-up echo showed an LVEF of 60-65%, mean gradient 24 mmHg, peak velocity 3.2 m/s, moderate AI, and a dimensionless index of 0.3.   From a symptomatic perspective, the patient complains of weakness and shortness of breath.  Her primary symptoms occur with standing. She is short of breath and weak in her legs when she stands.  She is unable to sing in the choir when she is standing up.  However, when sitting she can sing throughout the church service without shortness of breath.  She denies shortness of breath with walking short distances.  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     aspirin EC 81 MG tablet Take 81 mg by mouth 2 (two) times daily. Swallow whole.     Borage, Borago officinalis, (BORAGE 1000 PO) Take 1 tablet by mouth daily.     Calcium Carb-Cholecalciferol (CALCIUM 600 + D PO) Take 1 tablet by mouth in the morning and at bedtime.     carvedilol (COREG) 6.25 MG tablet Take 6.25 mg by mouth 2 (two) times daily as needed (BP Greater than 160). If BP is 150-159 patient takes 1/2 tablet BID     cholecalciferol (VITAMIN D3) 25 MCG (1000 UT) tablet Take 1,000 Units by mouth daily.     COLLAGEN PO Take 2,000 mg by mouth 2 (two) times daily.     cyanocobalamin 1000 MCG tablet Take 1,000 mcg by mouth 2 (two) times daily.     ferrous sulfate 325 (65 FE) MG EC tablet Take 325 mg by mouth daily.     folic acid (FOLVITE) 400 MCG tablet Take 400 mcg by mouth 2 (two) times daily.     hydrochlorothiazide (HYDRODIURIL) 12.5 MG tablet Take 12.5 mg by mouth daily. Only on Mondays if BP greater than 165     levocetirizine (XYZAL) 5 MG tablet Take 5 mg by mouth every evening.     Magnesium 250 MG TABS Take 250 mg by mouth 2 (two) times daily.     Omega-3 Fatty Acids (FISH OIL) 1000 MG CAPS Take 1,000 mg by  mouth daily.      pyridOXINE (VITAMIN B-6) 100 MG tablet Take 100 mg by mouth daily.     rosuvastatin (CRESTOR) 20 MG tablet Take 20 mg by mouth daily.     thiamine 100 MG tablet Take 100 mg by mouth daily.     topiramate (TOPAMAX) 25 MG tablet Take 25 mg by mouth daily.     warfarin (COUMADIN) 1 MG tablet Take 0.5 mg by mouth once a week. Alternating on Friday and Tuesdays     warfarin (COUMADIN) 2.5 MG tablet Take 2.5 mg by mouth daily.     No current facility-administered medications for this visit.    ALLERGIES:   Bactrim [sulfamethoxazole-trimethoprim] and Diovan [valsartan]   SOCIAL HISTORY:  The patient  reports that she quit smoking about 46 years ago. Her smoking use included cigarettes. She has never used smokeless tobacco. She reports that she  does not drink alcohol and does not use drugs.   FAMILY HISTORY:  The patient's family history includes Heart disease in her mother; Stroke in her maternal grandmother and mother; Vascular Disease in her maternal grandmother.   REVIEW OF SYSTEMS:  Positive for shortness of breath and fatigue, leg weakness.   All other systems are reviewed and negative.   PHYSICAL EXAM: VS:  BP (!) 180/100 (BP Location: Right Arm, Patient Position: Sitting, Cuff Size: Normal)   Pulse 79   Wt 147 lb 9.6 oz (67 kg)   SpO2 95%   BMI 23.82 kg/m  , BMI Body mass index is 23.82 kg/m. GEN: Pleasant elderly woman, in no acute distress HEENT: normal Neck: No JVD. carotids 2+ without bruits or masses Cardiac: The heart is RRR with 2/6 harsh mid peaking crescendo decrescendo murmur at the right upper sternal border, A2 is audible.  No edema. Pedal pulses 2+ = bilaterally  Respiratory:  clear to auscultation bilaterally GI: soft, nontender, nondistended, + BS MS: no deformity or atrophy Skin: warm and dry, no rash Neuro:  Strength and sensation are intact Psych: euthymic mood, full affect   RECENT LABS: No results found for requested labs within last 8760 hours.  No results found for requested labs within last 8760 hours.   CrCl cannot be calculated (Patient's most recent lab result is older than the maximum 21 days allowed.).   Wt Readings from Last 3 Encounters:  01/24/21 147 lb 9.6 oz (67 kg)  01/06/21 144 lb 6.4 oz (65.5 kg)  12/17/20 141 lb (64 kg)     CARDIAC STUDIES: Echo:  FINDINGS   Left Ventricle: Left ventricular ejection fraction, by estimation, is 60  to 65%. The left ventricle has normal function. The left ventricle has no  regional wall motion abnormalities. Global longitudinal strain performed  but not reported based on  interpreter judgement due to suboptimal tracking. The left ventricular  internal cavity size was normal in size. There is severe concentric left  ventricular  hypertrophy. Left ventricular diastolic parameters are  consistent with Grade I diastolic  dysfunction (impaired relaxation).   Right Ventricle: The right ventricular size is normal. No increase in  right ventricular wall thickness. Right ventricular systolic function is  normal. There is mildly elevated pulmonary artery systolic pressure. The  tricuspid regurgitant velocity is 2.93   m/s, and with an assumed right atrial pressure of 3 mmHg, the estimated  right ventricular systolic pressure is 37.3 mmHg.   Left Atrium: Left atrial size was normal in size.   Right Atrium: Right atrial size was normal  in size.   Pericardium: A small pericardial effusion is present. The pericardial  effusion is circumferential.   Mitral Valve: The mitral valve is normal in structure. Mild mitral valve  regurgitation. No evidence of mitral valve stenosis.   Tricuspid Valve: The tricuspid valve is normal in structure. Tricuspid  valve regurgitation is trivial. No evidence of tricuspid stenosis.   Aortic Valve: The aortic valve is calcified. Aortic valve regurgitation is  moderate. Aortic regurgitation PHT measures 327 msec. Moderate aortic  stenosis is present. Aortic valve mean gradient measures 24.0 mmHg. Aortic  valve peak gradient measures 40.8  mmHg. Aortic valve area, by VTI measures 0.99 cm.   Pulmonic Valve: The pulmonic valve was normal in structure. Pulmonic valve  regurgitation is not visualized. No evidence of pulmonic stenosis.   Aorta: The aortic root is normal in size and structure.   Venous: The inferior vena cava is normal in size with greater than 50%  respiratory variability, suggesting right atrial pressure of 3 mmHg.   IAS/Shunts: No atrial level shunt detected by color flow Doppler.   ASSESSMENT AND PLAN: 80 year old woman with moderately severe aortic stenosis and New York Heart Association functional class II-III symptoms of exertional dyspnea and fatigue.  I have  reviewed the natural history of aortic stenosis with the patient and their family members who are present today. We have discussed the limitations of medical therapy and the poor prognosis associated with symptomatic aortic stenosis. We have reviewed potential treatment options, including palliative medical therapy, conventional surgical aortic valve replacement, and transcatheter aortic valve replacement. We discussed treatment options in the context of the patient's specific comorbid medical conditions.  We discussed further diagnostic testing to better define the severity of her aortic stenosis and help understand the potential cause of her symptoms.  It is a bit unusual that her primary symptom is inability to stand or to sitting while standing.  It seems like she may have a component of deconditioning that contributes to her symptoms.  I have personally reviewed her echo images as well as the Doppler data and these seem consistent with moderate aortic stenosis.  I think it would be helpful to obtain a gated cardiac CTA with TAVR protocol to better evaluate leaflet morphology, aortic valve calcium score, and anatomic characteristics when considering TAVR.  In addition, I will obtain lab evaluation to assess for myocardial strain and congestive heart failure to include a troponin and BNP.  If her CTA study is also consistent with moderate aortic stenosis, I would recommend ongoing echo and clinical surveillance and would plan to see her back in about 6 - 12 months for follow-up evaluation.  She could also potentially be considered for a moderate risk TAVR study if she meets criteria.  We will review her case again when her CTA study is completed.    Enzo Bi 02/01/2021 6:16 AM     [facility] [address] [place] [address]  [phone] (office) [phone] (fax)

## 2021-01-25 ENCOUNTER — Encounter: Payer: Self-pay | Admitting: Cardiology

## 2021-01-25 ENCOUNTER — Other Ambulatory Visit: Payer: Self-pay | Admitting: Cardiology

## 2021-01-25 ENCOUNTER — Telehealth: Payer: Self-pay | Admitting: Cardiology

## 2021-01-25 NOTE — Progress Notes (Signed)
Pre-TAVR Testing:  You are scheduled for pre-TAVR CT scans  on 02/08/21 at 10:45am at [facility] (Main Entrance A, Valet Parking). Please check in at 10:15am in Radiology (first floor). Your CT scans (of chest/abdomen/pelvis/heart) will begin at 10:45am.  Please follow these instructions carefully:  On the Night Before the Test: Be sure to Drink plenty of water. Do not consume any caffeinated/decaffeinated beverages or chocolate 12 hours prior to your test. Do not take any antihistamines 12 hours prior to your test.  On the Day of the Test: Drink plenty of water until 1 hour prior to the test. Do not eat any food 4 hours prior to the test. You may take your regular medications prior to the test.  Take Carvedilol two hours prior to test. FEMALES- please wear underwire-free bra if available   After the Test: Drink plenty of water. After receiving IV contrast, you may experience a mild flushed feeling. This is normal. On occasion, you may experience a mild rash up to 24 hours after the test. This is not dangerous. If this occurs, you can take Benadryl 25 mg and increase your fluid intake. If you experience trouble breathing, this can be serious. If it is severe call 911 IMMEDIATELY. If it is mild, please call our office. If you take any of these medications: Glipizide/Metformin, Avandament, Glucavance, please do not take 48 hours after completing test unless otherwise instructed.

## 2021-01-25 NOTE — Telephone Encounter (Signed)
  [facility]   MULTIDISCIPLINARY HEART VALVE TEAM   Attempted to contact patient to schedule CT imaging as a part of her TAVR workup. Left voice message for return call.    Georgie Chard NP-C Structural Heart Team  Pager: [phone]

## 2021-01-26 DIAGNOSIS — Z23 Encounter for immunization: Secondary | ICD-10-CM | POA: Diagnosis not present

## 2021-01-26 DIAGNOSIS — I351 Nonrheumatic aortic (valve) insufficiency: Secondary | ICD-10-CM | POA: Diagnosis not present

## 2021-01-26 DIAGNOSIS — Z79899 Other long term (current) drug therapy: Secondary | ICD-10-CM | POA: Diagnosis not present

## 2021-01-26 DIAGNOSIS — G250 Essential tremor: Secondary | ICD-10-CM | POA: Diagnosis not present

## 2021-01-26 DIAGNOSIS — Z8673 Personal history of transient ischemic attack (TIA), and cerebral infarction without residual deficits: Secondary | ICD-10-CM | POA: Diagnosis not present

## 2021-01-26 DIAGNOSIS — I5030 Unspecified diastolic (congestive) heart failure: Secondary | ICD-10-CM | POA: Diagnosis not present

## 2021-01-26 DIAGNOSIS — R0609 Other forms of dyspnea: Secondary | ICD-10-CM | POA: Diagnosis not present

## 2021-01-26 DIAGNOSIS — D649 Anemia, unspecified: Secondary | ICD-10-CM | POA: Diagnosis not present

## 2021-01-26 DIAGNOSIS — Z1331 Encounter for screening for depression: Secondary | ICD-10-CM | POA: Diagnosis not present

## 2021-01-31 NOTE — Telephone Encounter (Signed)
Pt returned my call in regards to CT appointments.  Instructions reviewed with the pt over the phone.  The pt only takes Carvedilol on a PRN basis for elevated BP.  She has issues with orthostatic hypotension and is nervous about taking 12.5 mg of Carvedilol the morning of test.  For elevated BP she takes 6.25mg .  I advised the pt to take 6.25mg  of Carvedilol the morning of test and pt agreed with plan.

## 2021-01-31 NOTE — Telephone Encounter (Signed)
Left message on pt's voicemail to contact me in regards to appointment date and time for TAVR CT scan.

## 2021-02-01 ENCOUNTER — Encounter: Payer: Self-pay | Admitting: Cardiovascular Disease

## 2021-02-02 ENCOUNTER — Other Ambulatory Visit: Payer: Self-pay

## 2021-02-02 DIAGNOSIS — I359 Nonrheumatic aortic valve disorder, unspecified: Secondary | ICD-10-CM

## 2021-02-08 ENCOUNTER — Other Ambulatory Visit: Payer: Self-pay

## 2021-02-08 ENCOUNTER — Encounter (HOSPITAL_COMMUNITY): Payer: Self-pay

## 2021-02-08 ENCOUNTER — Ambulatory Visit (HOSPITAL_COMMUNITY)
Admission: RE | Admit: 2021-02-08 | Discharge: 2021-02-08 | Disposition: A | Discharge status: Home / Self Care | Payer: Medicare Other | Source: Ambulatory Visit | Attending: Cardiovascular Disease | Admitting: Cardiovascular Disease

## 2021-02-08 DIAGNOSIS — Z01810 Encounter for preprocedural cardiovascular examination: Secondary | ICD-10-CM | POA: Diagnosis not present

## 2021-02-08 DIAGNOSIS — I351 Nonrheumatic aortic (valve) insufficiency: Secondary | ICD-10-CM | POA: Insufficient documentation

## 2021-02-08 DIAGNOSIS — R0602 Shortness of breath: Secondary | ICD-10-CM | POA: Insufficient documentation

## 2021-02-08 DIAGNOSIS — I350 Nonrheumatic aortic (valve) stenosis: Secondary | ICD-10-CM | POA: Insufficient documentation

## 2021-02-08 DIAGNOSIS — I359 Nonrheumatic aortic valve disorder, unspecified: Secondary | ICD-10-CM | POA: Insufficient documentation

## 2021-02-08 MED ORDER — IOHEXOL 350 MG/ML SOLN
95.0000 mL | Freq: Once | INTRAVENOUS | Status: AC | PRN
  Administered 2021-02-08: 95 mL via INTRAVENOUS

## 2021-02-14 DIAGNOSIS — Z7901 Long term (current) use of anticoagulants: Secondary | ICD-10-CM | POA: Diagnosis not present

## 2021-03-02 DIAGNOSIS — R918 Other nonspecific abnormal finding of lung field: Secondary | ICD-10-CM | POA: Diagnosis not present

## 2021-03-02 DIAGNOSIS — Z6821 Body mass index (BMI) 21.0-21.9, adult: Secondary | ICD-10-CM | POA: Diagnosis not present

## 2021-03-02 DIAGNOSIS — J690 Pneumonitis due to inhalation of food and vomit: Secondary | ICD-10-CM | POA: Diagnosis not present

## 2021-03-15 DIAGNOSIS — Z6821 Body mass index (BMI) 21.0-21.9, adult: Secondary | ICD-10-CM | POA: Diagnosis not present

## 2021-03-15 DIAGNOSIS — I951 Orthostatic hypotension: Secondary | ICD-10-CM | POA: Diagnosis not present

## 2021-03-15 DIAGNOSIS — J690 Pneumonitis due to inhalation of food and vomit: Secondary | ICD-10-CM | POA: Diagnosis not present

## 2021-04-01 DIAGNOSIS — Z7901 Long term (current) use of anticoagulants: Secondary | ICD-10-CM | POA: Diagnosis not present

## 2021-04-01 DIAGNOSIS — R1310 Dysphagia, unspecified: Secondary | ICD-10-CM | POA: Diagnosis not present

## 2021-04-29 DIAGNOSIS — Z23 Encounter for immunization: Secondary | ICD-10-CM | POA: Diagnosis not present

## 2021-05-04 DIAGNOSIS — Z7901 Long term (current) use of anticoagulants: Secondary | ICD-10-CM | POA: Diagnosis not present

## 2021-05-16 DIAGNOSIS — Z7901 Long term (current) use of anticoagulants: Secondary | ICD-10-CM | POA: Diagnosis not present

## 2021-06-01 DIAGNOSIS — Z7901 Long term (current) use of anticoagulants: Secondary | ICD-10-CM | POA: Diagnosis not present

## 2021-06-16 ENCOUNTER — Other Ambulatory Visit: Payer: Self-pay

## 2021-06-16 ENCOUNTER — Ambulatory Visit (INDEPENDENT_AMBULATORY_CARE_PROVIDER_SITE_OTHER): Payer: Medicare Other | Admitting: Cardiology

## 2021-06-16 ENCOUNTER — Encounter: Payer: Self-pay | Admitting: Cardiology

## 2021-06-16 VITALS — BP 158/70 | HR 80 | Ht 66.0 in | Wt 141.0 lb

## 2021-06-16 DIAGNOSIS — E782 Mixed hyperlipidemia: Secondary | ICD-10-CM

## 2021-06-16 DIAGNOSIS — Z8673 Personal history of transient ischemic attack (TIA), and cerebral infarction without residual deficits: Secondary | ICD-10-CM

## 2021-06-16 DIAGNOSIS — K224 Dyskinesia of esophagus: Secondary | ICD-10-CM | POA: Diagnosis not present

## 2021-06-16 DIAGNOSIS — I5189 Other ill-defined heart diseases: Secondary | ICD-10-CM

## 2021-06-16 DIAGNOSIS — I10 Essential (primary) hypertension: Secondary | ICD-10-CM | POA: Diagnosis not present

## 2021-06-16 DIAGNOSIS — I350 Nonrheumatic aortic (valve) stenosis: Secondary | ICD-10-CM | POA: Diagnosis not present

## 2021-06-16 NOTE — Progress Notes (Signed)
Cardiology Office Note:    Date:  06/16/2021   ID:  Roberta Mercer, DOB 1940/08/11, MRN [id number]  PCP:  Mateo Flow, MD  Cardiologist:  Jenne Campus, MD    Referring MD: Mateo Flow, MD   Chief Complaint  Patient presents with   Follow-up    Former patient of Dr. Harriet Masson  I am doing fine  History of Present Illness:    Roberta Mercer is a 81 y.o. female with past medical history significant for multiple CVA, she was on Eliquis when she developed CVAs, also have history of repeated DVT.  Initially she was on Coumadin she does not suffer from any CVA but then when she was switched to Eliquis she ended up having strokes and at that time she was put back on Coumadin.  She also got aortic stenosis which being assessed last time in September is moderate.  She was referred to our structural heart disease program which felt that she did not reach criteria for interventions here.  She also have a history of essential hypertension, essential tremor, diastolic dysfunctions. She comes today 2 months for follow-up.  Overall she is doing well.  Still complain of having shortness of breath with standing and singing in the choir.  However walking around did not change and it would not last 6 months.  Denies have any chest pain tightness squeezing pressure burning chest there is no dizziness.  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   aspirin EC 81 MG tablet Take 81 mg by mouth 2 (two) times daily. Swallow whole.   Borage, Borago officinalis, (BORAGE 1000 PO) Take 1 tablet by mouth daily.   Calcium Carb-Cholecalciferol (CALCIUM 600 + D PO) Take 1 tablet by mouth in the morning and at bedtime.   carvedilol (COREG) 6.25 MG tablet Take 6.25 mg by mouth 2 (two) times daily as needed (BP Greater than 160). If BP is 150-159 patient takes 1/2 tablet BID   cholecalciferol (VITAMIN D3) 25 MCG (1000 UT) tablet Take 1,000 Units by mouth daily.   COLLAGEN PO Take 2,000 mg by mouth 2 (two) times daily.   cyanocobalamin 1000 MCG tablet Take 1,000 mcg by mouth 2 (two) times daily.   ferrous sulfate 325 (65 FE) MG EC tablet Take 325 mg by mouth daily.   folic acid (FOLVITE) [id number] MCG tablet Take 400 mcg by mouth 2 (two) times daily.   hydrochlorothiazide (HYDRODIURIL) 12.5 MG tablet Take 12.5 mg by  mouth daily. Only on Mondays if BP greater than 165   Magnesium 250 MG TABS Take 250 mg by mouth 2 (two) times daily.   Omega-3 Fatty Acids (FISH OIL) 1000 MG CAPS Take 1,000 mg by mouth daily.    pyridOXINE (VITAMIN B-6) 100 MG tablet Take 100 mg by mouth daily.   rosuvastatin (CRESTOR) 20 MG tablet Take 20 mg by mouth daily.   thiamine 100 MG tablet Take 100 mg by mouth daily.   topiramate (TOPAMAX) 25 MG tablet Take 25 mg by mouth daily.   warfarin (COUMADIN) 1 MG tablet Take 0.5 mg by mouth once a week. Friday and Tuesdays   warfarin (COUMADIN) 2.5 MG tablet Take 2.5 mg by mouth daily.     Allergies:   Bactrim [sulfamethoxazole-trimethoprim] and Diovan  [valsartan]   Social History   Socioeconomic History   Marital status: Single    Spouse name: Not on file   Number of children: 0   Years of education: Not on file   Highest education level: Not on file  Occupational History   Occupation: retired  Tobacco Use   Smoking status: Former    Types: Cigarettes    Quit date: 1976    Years since quitting: 47.1   Smokeless tobacco: Never  Substance and Sexual Activity   Alcohol use: Never   Drug use: Never   Sexual activity: Not on file  Other Topics Concern   Not on file  Social History Narrative   Lives alone   Right handed   Drinks no caffeine      Social Determinants of Health   Financial Resource Strain: Not on file  Food Insecurity: Not on file  Transportation Needs: Not on file  Physical Activity: Not on file  Stress: Not on file  Social Connections: Not on file     Family History: The patient's family history includes Heart disease in her mother; Stroke in her maternal grandmother and mother; Vascular Disease in her maternal grandmother. ROS:   Please see the history of present illness.    All 14 point review of systems negative except as described per history of present illness  EKGs/Labs/Other Studies Reviewed:      Recent Labs: No results found for requested labs within last 8760 hours.  Recent Lipid Panel    Component Value Date/Time   CHOL 143 01/20/2020 1049   TRIG 97 01/20/2020 1049   HDL 61 01/20/2020 1049   CHOLHDL 2.3 01/20/2020 1049   LDLCALC 64 01/20/2020 1049    Physical Exam:    VS:  BP (!) 158/70 (BP Location: Left Arm, Patient Position: Sitting)    Pulse 80    Ht 5\' 6"  (1.676 m)    Wt 141 lb (64 kg)    SpO2 96%    BMI 22.76 kg/m     Wt Readings from Last 3 Encounters:  06/16/21 141 lb (64 kg)  01/24/21 147 lb 9.6 oz (67 kg)  01/06/21 144 lb 6.4 oz (65.5 kg)     GEN:  Well nourished, well developed in no acute distress HEENT: Normal NECK: No JVD; No carotid bruits LYMPHATICS:  No lymphadenopathy CARDIAC: RRR, n systolic ejection murmur grade 3/6 percent right upper portion of the sternum, S2 is still present, there is also holodiastolic murmur best heard left border of the sternum grade 1/6 to 2/6, no rubs, no gallops RESPIRATORY:  Clear to auscultation without rales, wheezing or rhonchi  ABDOMEN: Soft, non-tender, non-distended MUSCULOSKELETAL:  No edema; No  deformity  SKIN: Warm and dry LOWER EXTREMITIES: no swelling NEUROLOGIC:  Alert and oriented x 3 PSYCHIATRIC:  Normal affect   ASSESSMENT:    1. Moderate aortic stenosis   2. Esophageal spasm   3. Essential hypertension   4. History of stroke   5. Diastolic dysfunction   6. Mixed hyperlipidemia    PLAN:    In order of problems listed above:  Moderate aortic stenosis.  Part of evaluation included calcium score of the aortic valve which was 447.  Last assessment in September was moderate.  I will ask her to have repeated echocardiogram done to look at the valve again. Essential hypertension her blood pressure is slightly elevated today she tells me that she stopped all her medications.  She said when she is taking water pill she is getting dizzy upon getting up and she is aware she is in [facility].  She said when she checked blood pressure at home is usually 1 [id number] 40 systolic.  I asked her to check blood pressure on the regular basis and bring results to me within next few weeks Mixed dyslipidemia, I did review her K PN which show me her LDL of 64 HDL 61.  However this data is from 09/2019.  She is taking Crestor 20 which I will continue.  I will call primary care physician to get copy of her more updated data. I spent a great deal of time talking to her about aortic stenosis we did discuss typical signs and symptoms of severe arctic stenosis which will be increased shortness of breath dizziness chest pain.  She told me that she would let me know if she develop any of those otherwise I will see her back in about 4  months   Medication Adjustments/Labs and Tests Ordered: Current medicines are reviewed at length with the patient today.  Concerns regarding medicines are outlined above.  No orders of the defined types were placed in this encounter.  Medication changes: No orders of the defined types were placed in this encounter.   Signed, Park Liter, MD, [place] 06/16/2021 2:55 PM    [place]

## 2021-06-16 NOTE — Patient Instructions (Signed)
Medication Instructions:  ?Your physician recommends that you continue on your current medications as directed. Please refer to the Current Medication list given to you today. ? ?*If you need a refill on your cardiac medications before your next appointment, please call your pharmacy* ? ? ?Lab Work: ?None ?If you have labs (blood work) drawn today and your tests are completely normal, you will receive your results only by: ?MyChart Message (if you have MyChart) OR ?A paper copy in the mail ?If you have any lab test that is abnormal or we need to change your treatment, we will call you to review the results. ? ? ?Testing/Procedures: ?Your physician has requested that you have an echocardiogram. Echocardiography is a painless test that uses sound waves to create images of your heart. It provides your doctor with information about the size and shape of your heart and how well your heart?s chambers and valves are working. This procedure takes approximately one hour. There are no restrictions for this procedure. ? ? ? ?Follow-Up: ?At [facility], you and your health needs are our priority.  As part of our continuing mission to provide you with exceptional heart care, we have created designated Provider Care Teams.  These Care Teams include your primary Cardiologist (physician) and Advanced Practice Providers (APPs -  Physician Assistants and Nurse Practitioners) who all work together to provide you with the care you need, when you need it. ? ?We recommend signing up for the patient portal called "MyChart".  Sign up information is provided on this After Visit Summary.  MyChart is used to connect with patients for Virtual Visits (Telemedicine).  Patients are able to view lab/test results, encounter notes, upcoming appointments, etc.  Non-urgent messages can be sent to your provider as well.   ?To learn more about what you can do with MyChart, go to [email or web address].   ? ?Your next appointment:   ?5  month(s) ? ?The format for your next appointment:   ?In Person ? ?Provider:   ?Robert Krasowski, MD  ? ? ?Other Instructions ?None ? ?

## 2021-06-23 ENCOUNTER — Ambulatory Visit (INDEPENDENT_AMBULATORY_CARE_PROVIDER_SITE_OTHER): Payer: Medicare Other

## 2021-06-23 ENCOUNTER — Other Ambulatory Visit: Payer: Self-pay

## 2021-06-23 DIAGNOSIS — K224 Dyskinesia of esophagus: Secondary | ICD-10-CM | POA: Diagnosis not present

## 2021-06-23 DIAGNOSIS — I10 Essential (primary) hypertension: Secondary | ICD-10-CM | POA: Diagnosis not present

## 2021-06-23 DIAGNOSIS — I350 Nonrheumatic aortic (valve) stenosis: Secondary | ICD-10-CM | POA: Diagnosis not present

## 2021-06-23 DIAGNOSIS — E782 Mixed hyperlipidemia: Secondary | ICD-10-CM | POA: Diagnosis not present

## 2021-06-23 DIAGNOSIS — Z8673 Personal history of transient ischemic attack (TIA), and cerebral infarction without residual deficits: Secondary | ICD-10-CM

## 2021-06-23 DIAGNOSIS — I5189 Other ill-defined heart diseases: Secondary | ICD-10-CM

## 2021-06-23 LAB — ECHOCARDIOGRAM COMPLETE
AR max vel: 0.85 cm2
AV Area VTI: 0.82 cm2
AV Area mean vel: 0.81 cm2
AV Mean grad: 27.3 mmHg
AV Peak grad: 43.7 mmHg
Ao pk vel: 3.31 m/s
Area-P 1/2: 3.06 cm2
P 1/2 time: 422 msec
S' Lateral: 2.5 cm

## 2021-07-06 DIAGNOSIS — Z7901 Long term (current) use of anticoagulants: Secondary | ICD-10-CM | POA: Diagnosis not present

## 2021-08-09 DIAGNOSIS — Z7901 Long term (current) use of anticoagulants: Secondary | ICD-10-CM | POA: Diagnosis not present

## 2021-09-14 DIAGNOSIS — Z7901 Long term (current) use of anticoagulants: Secondary | ICD-10-CM | POA: Diagnosis not present

## 2021-10-17 DIAGNOSIS — R300 Dysuria: Secondary | ICD-10-CM | POA: Diagnosis not present

## 2021-10-17 DIAGNOSIS — Z7901 Long term (current) use of anticoagulants: Secondary | ICD-10-CM | POA: Diagnosis not present

## 2021-11-09 DIAGNOSIS — Z7901 Long term (current) use of anticoagulants: Secondary | ICD-10-CM | POA: Diagnosis not present

## 2021-11-17 ENCOUNTER — Ambulatory Visit (INDEPENDENT_AMBULATORY_CARE_PROVIDER_SITE_OTHER): Payer: Medicare Other | Admitting: Cardiology

## 2021-11-17 ENCOUNTER — Encounter: Payer: Self-pay | Admitting: Cardiology

## 2021-11-17 VITALS — BP 150/70 | HR 92 | Ht 66.0 in | Wt 146.0 lb

## 2021-11-17 DIAGNOSIS — N1831 Chronic kidney disease, stage 3a: Secondary | ICD-10-CM

## 2021-11-17 DIAGNOSIS — I350 Nonrheumatic aortic (valve) stenosis: Secondary | ICD-10-CM

## 2021-11-17 DIAGNOSIS — I351 Nonrheumatic aortic (valve) insufficiency: Secondary | ICD-10-CM

## 2021-11-17 DIAGNOSIS — I10 Essential (primary) hypertension: Secondary | ICD-10-CM

## 2021-11-17 DIAGNOSIS — R0989 Other specified symptoms and signs involving the circulatory and respiratory systems: Secondary | ICD-10-CM

## 2021-11-17 DIAGNOSIS — Z8673 Personal history of transient ischemic attack (TIA), and cerebral infarction without residual deficits: Secondary | ICD-10-CM | POA: Diagnosis not present

## 2021-11-17 DIAGNOSIS — R0609 Other forms of dyspnea: Secondary | ICD-10-CM

## 2021-11-17 NOTE — Addendum Note (Signed)
Addended by: Heywood Bene on: 11/17/2021 10:51 AM   Modules accepted: Orders

## 2021-11-17 NOTE — Progress Notes (Signed)
Cardiology Office Note:    Date:  11/17/2021   ID:  Roberta Mercer, DOB [birth year], MRN [id number]  PCP:  Lise Auer, MD  Cardiologist:  Gypsy Balsam, MD    Referring MD: Lise Auer, MD   Chief Complaint  Patient presents with   Follow-up    History of Present Illness:    Roberta Mercer is a 81 y.o. female  with past medical history significant for multiple CVA, she was on Eliquis when she developed CVAs, also have history of repeated DVT.  Initially she was on Coumadin she does not suffer from any CVA but then when she was switched to Eliquis she ended up having strokes and at that time she was put back on Coumadin.  She also got aortic stenosis which being assessed last time in September is moderate.  She was referred to our structural heart disease program which felt that she did not reach criteria for interventions here.  She also have a history of essential hypertension, essential tremor, diastolic dysfunctions. .  She had another echocardiogram done in March of this year which showed still presence of moderate aortic stenosis moderate aortic insufficiency, left ventricle ejection fraction was preserved, left atrial size was normal.  Overall she is doing well.  She denies have any chest pain tightness squeezing pressure burning chest no dizziness no passing out no palpitations.  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   aspirin EC 81 MG tablet Take 81 mg by mouth 2 (two) times daily. Swallow whole.   Borage, Borago officinalis, (BORAGE 1000 PO) Take 1 tablet by mouth daily.   Calcium Carb-Cholecalciferol (CALCIUM 600 + D PO) Take 1 tablet by mouth in the morning and at bedtime.   cephALEXin (KEFLEX) 500 MG capsule Take 500 mg by mouth 2 (two) times daily. UTI   cholecalciferol (VITAMIN D3) 25 MCG (1000 UT) tablet Take 1,000 Units by mouth daily.   COLLAGEN PO Take 2,000 mg by mouth 2 (two) times daily.   cyanocobalamin 1000 MCG tablet Take 1,000 mcg by mouth 2 (two) times daily.   ferrous sulfate 325 (65 FE) MG EC tablet Take 325 mg by mouth daily.   folic acid (FOLVITE) 400 MCG tablet Take 400 mcg by mouth 2 (two) times daily.   Magnesium 250 MG TABS Take 250 mg by mouth 2 (two) times daily.   Omega-3 Fatty Acids (FISH OIL) 1000 MG CAPS Take 1,000 mg by mouth daily.  pyridOXINE (VITAMIN B-6) 100 MG tablet Take 100 mg by mouth daily.   rosuvastatin (CRESTOR) 20 MG tablet Take 20 mg by mouth daily.   thiamine 100 MG tablet Take 100 mg by mouth daily.   topiramate (TOPAMAX) 25 MG tablet Take 25 mg by mouth daily.   warfarin (COUMADIN) 1 MG tablet Take 0.5 mg by mouth once a week. Friday and Tuesdays   warfarin (COUMADIN) 2.5 MG tablet Take 2.5 mg by mouth daily. Additional 2.5 Monday's and Fridays   [DISCONTINUED] carvedilol (COREG) 6.25 MG tablet Take 6.25 mg by mouth 2 (two) times daily as needed (BP Greater than 160). If BP is 150-159 patient takes 1/2 tablet BID   [DISCONTINUED] hydrochlorothiazide (HYDRODIURIL)  12.5 MG tablet Take 12.5 mg by mouth daily. Only on Mondays if BP greater than 165     Allergies:   Bactrim [sulfamethoxazole-trimethoprim] and Diovan [valsartan]   Social History   Socioeconomic History   Marital status: Single    Spouse name: Not on file   Number of children: 0   Years of education: Not on file   Highest education level: Not on file  Occupational History   Occupation: retired  Tobacco Use   Smoking status: Former    Types: Cigarettes    Quit date: 1976    Years since quitting: 47.6   Smokeless tobacco: Never  Substance and Sexual Activity   Alcohol use: Never   Drug use: Never   Sexual activity: Not on file  Other Topics Concern   Not on file  Social History Narrative   Lives alone   Right handed   Drinks no caffeine      Social Determinants of Health   Financial Resource Strain: Not on file  Food Insecurity: Not on file  Transportation Needs: Not on file  Physical Activity: Not on file  Stress: Not on file  Social Connections: Not on file     Family History: The patient's family history includes Heart disease in her mother; Stroke in her maternal grandmother and mother; Vascular Disease in her maternal grandmother. ROS:   Please see the history of present illness.    All 14 point review of systems negative except as described per history of present illness  EKGs/Labs/Other Studies Reviewed:      Recent Labs: No results found for requested labs within last 365 days.  Recent Lipid Panel    Component Value Date/Time   CHOL 143 01/20/2020 1049   TRIG 97 01/20/2020 1049   HDL 61 01/20/2020 1049   CHOLHDL 2.3 01/20/2020 1049   LDLCALC 64 01/20/2020 1049    Physical Exam:    VS:  BP (!) 150/70 (BP Location: Left Arm, Patient Position: Sitting)   Pulse 92   Ht 5\' 6"  (1.676 m)   Wt 146 lb (66.2 kg)   SpO2 99%   BMI 23.57 kg/m     Wt Readings from Last 3 Encounters:  11/17/21 146 lb (66.2 kg)  06/16/21 141 lb (64 kg)  01/24/21  147 lb 9.6 oz (67 kg)     GEN:  Well nourished, well developed in no acute distress HEENT: Normal NECK: No JVD; bilateral carotic bruit LYMPHATICS: No lymphadenopathy CARDIAC: RRR, systolic ejection murmur grade 3/6 best heard right upper portion of the sternum, there is also holodiastolic murmur left border of the sternum 1/6, no rubs, no gallops RESPIRATORY:  Clear to auscultation without rales, wheezing or rhonchi  ABDOMEN: Soft, non-tender, non-distended MUSCULOSKELETAL:  No edema; No deformity  SKIN: Warm and dry LOWER EXTREMITIES: no swelling NEUROLOGIC:  Alert and oriented x 3 PSYCHIATRIC:  Normal affect   ASSESSMENT:    1. Nonrheumatic aortic valve stenosis   2. Nonrheumatic aortic valve insufficiency   3. Essential hypertension   4. Stage 3a chronic kidney disease (HCC)   5. History of CVA (cerebrovascular accident)    PLAN:    In order of problems listed above:  Aortic stenosis with aortic regurgitation there is moderate.  I explained 1 more time signs and symptoms of worsening of the aortic valve stenosis.  She will let me know if she develops any of the symptoms.  She will be due to have another echocardiogram probably end of September.  She does have bilateral carotic bruit which is simply probably conducted murmur from her aortic stenosis however I will do carotic ultrasounds to rule out any significant cardiac arterial stenosis. Essential hypertension blood pressure seems to be a little elevated today but she tells me at home it is always good Dyslipidemia I did review K PN which show me her LDL of 64 HDL 61 is a good cholesterol profile Chronic kidney disease that being followed by internal medicine team last creatinine I see is 1.1 History of CVA stable.  She is on Coumadin since she had episode of CVA while being on Eliquis.   Medication Adjustments/Labs and Tests Ordered: Current medicines are reviewed at length with the patient today.  Concerns regarding  medicines are outlined above.  No orders of the defined types were placed in this encounter.  Medication changes: No orders of the defined types were placed in this encounter.   Signed, Georgeanna Lea, MD, [facility] 11/17/2021 10:07 AM    [facility]

## 2021-11-17 NOTE — Addendum Note (Signed)
Addended by: Baldo Ash D on: 11/17/2021 10:23 AM   Modules accepted: Orders

## 2021-11-17 NOTE — Patient Instructions (Signed)
Medication Instructions:  Your physician recommends that you continue on your current medications as directed. Please refer to the Current Medication list given to you today.  *If you need a refill on your cardiac medications before your next appointment, please call your pharmacy*   Lab Work: None Ordered If you have labs (blood work) drawn today and your tests are completely normal, you will receive your results only by: MyChart Message (if you have MyChart) OR A paper copy in the mail If you have any lab test that is abnormal or we need to change your treatment, we will call you to review the results.   Testing/Procedures: Your physician has requested that you have a carotid duplex. This test is an ultrasound of the carotid arteries in your neck. It looks at blood flow through these arteries that supply the brain with blood. Allow one hour for this exam. There are no restrictions or special instructions.   Your physician has requested that you have an echocardiogram. Echocardiography is a painless test that uses sound waves to create images of your heart. It provides your doctor with information about the size and shape of your heart and how well your heart's chambers and valves are working. This procedure takes approximately one hour. There are no restrictions for this procedure.    Follow-Up: At [facility], you and your health needs are our priority.  As part of our continuing mission to provide you with exceptional heart care, we have created designated Provider Care Teams.  These Care Teams include your primary Cardiologist (physician) and Advanced Practice Providers (APPs -  Physician Assistants and Nurse Practitioners) who all work together to provide you with the care you need, when you need it.  We recommend signing up for the patient portal called "MyChart".  Sign up information is provided on this After Visit Summary.  MyChart is used to connect with patients for Virtual Visits  (Telemedicine).  Patients are able to view lab/test results, encounter notes, upcoming appointments, etc.  Non-urgent messages can be sent to your provider as well.   To learn more about what you can do with MyChart, go to [email or web address].    Your next appointment:   6 month(s)  The format for your next appointment:   In Person  Provider:   Robert Krasowski, MD    Other Instructions NA  

## 2021-12-02 DIAGNOSIS — Z7901 Long term (current) use of anticoagulants: Secondary | ICD-10-CM | POA: Diagnosis not present

## 2021-12-21 DIAGNOSIS — R269 Unspecified abnormalities of gait and mobility: Secondary | ICD-10-CM | POA: Diagnosis not present

## 2021-12-21 DIAGNOSIS — J310 Chronic rhinitis: Secondary | ICD-10-CM | POA: Diagnosis not present

## 2022-01-05 ENCOUNTER — Ambulatory Visit (INDEPENDENT_AMBULATORY_CARE_PROVIDER_SITE_OTHER): Payer: Medicare Other

## 2022-01-05 ENCOUNTER — Ambulatory Visit: Payer: Medicare Other | Attending: Cardiology

## 2022-01-05 DIAGNOSIS — R0989 Other specified symptoms and signs involving the circulatory and respiratory systems: Secondary | ICD-10-CM | POA: Diagnosis not present

## 2022-01-05 DIAGNOSIS — R0609 Other forms of dyspnea: Secondary | ICD-10-CM | POA: Diagnosis not present

## 2022-01-06 ENCOUNTER — Telehealth: Payer: Self-pay

## 2022-01-06 LAB — ECHOCARDIOGRAM COMPLETE
AR max vel: 0.97 cm2
AV Area VTI: 1.08 cm2
AV Area mean vel: 0.95 cm2
AV Mean grad: 23 mmHg
AV Peak grad: 42.4 mmHg
AV Vena cont: 0.3 cm
Ao pk vel: 3.26 m/s
Area-P 1/2: 3.76 cm2
P 1/2 time: 322 msec
S' Lateral: 3.2 cm

## 2022-01-06 NOTE — Telephone Encounter (Signed)
I contacted the pt to discuss the results of her echocardiogram.  This shows moderate aortic stenosis with normal LV function. The pt was previously evaluated by Dr Burt Knack in October 2022 and he planned to follow up with the pt in 6-12 months.  I attempted to arrange a follow-up office visit for the patient to see Dr Burt Knack but the pt did not want to schedule a visit at this time.  I made the pt aware that Dr Agustin Cree is following her closely and he can refer her back to Dr Burt Knack if needed in the future.  The pt agreed with plan.

## 2022-01-09 DIAGNOSIS — Z1619 Resistance to other specified beta lactam antibiotics: Secondary | ICD-10-CM | POA: Diagnosis not present

## 2022-01-09 DIAGNOSIS — B9620 Unspecified Escherichia coli [E. coli] as the cause of diseases classified elsewhere: Secondary | ICD-10-CM | POA: Diagnosis not present

## 2022-01-09 DIAGNOSIS — I351 Nonrheumatic aortic (valve) insufficiency: Secondary | ICD-10-CM | POA: Diagnosis not present

## 2022-01-09 DIAGNOSIS — E7800 Pure hypercholesterolemia, unspecified: Secondary | ICD-10-CM | POA: Diagnosis not present

## 2022-01-09 DIAGNOSIS — M1990 Unspecified osteoarthritis, unspecified site: Secondary | ICD-10-CM | POA: Diagnosis not present

## 2022-01-09 DIAGNOSIS — R29810 Facial weakness: Secondary | ICD-10-CM | POA: Diagnosis not present

## 2022-01-09 DIAGNOSIS — N1831 Chronic kidney disease, stage 3a: Secondary | ICD-10-CM | POA: Diagnosis not present

## 2022-01-09 DIAGNOSIS — R29725 NIHSS score 25: Secondary | ICD-10-CM | POA: Diagnosis not present

## 2022-01-09 DIAGNOSIS — I82891 Chronic embolism and thrombosis of other specified veins: Secondary | ICD-10-CM | POA: Diagnosis not present

## 2022-01-09 DIAGNOSIS — I672 Cerebral atherosclerosis: Secondary | ICD-10-CM | POA: Diagnosis not present

## 2022-01-09 DIAGNOSIS — R1310 Dysphagia, unspecified: Secondary | ICD-10-CM | POA: Diagnosis not present

## 2022-01-09 DIAGNOSIS — N390 Urinary tract infection, site not specified: Secondary | ICD-10-CM | POA: Diagnosis not present

## 2022-01-09 DIAGNOSIS — Z882 Allergy status to sulfonamides status: Secondary | ICD-10-CM | POA: Diagnosis not present

## 2022-01-09 DIAGNOSIS — G459 Transient cerebral ischemic attack, unspecified: Secondary | ICD-10-CM | POA: Diagnosis not present

## 2022-01-09 DIAGNOSIS — N179 Acute kidney failure, unspecified: Secondary | ICD-10-CM | POA: Diagnosis not present

## 2022-01-09 DIAGNOSIS — R531 Weakness: Secondary | ICD-10-CM | POA: Diagnosis not present

## 2022-01-09 DIAGNOSIS — Z79899 Other long term (current) drug therapy: Secondary | ICD-10-CM | POA: Diagnosis not present

## 2022-01-09 DIAGNOSIS — I10 Essential (primary) hypertension: Secondary | ICD-10-CM | POA: Diagnosis not present

## 2022-01-09 DIAGNOSIS — G8191 Hemiplegia, unspecified affecting right dominant side: Secondary | ICD-10-CM | POA: Diagnosis not present

## 2022-01-09 DIAGNOSIS — M6259 Muscle wasting and atrophy, not elsewhere classified, multiple sites: Secondary | ICD-10-CM | POA: Diagnosis not present

## 2022-01-09 DIAGNOSIS — I129 Hypertensive chronic kidney disease with stage 1 through stage 4 chronic kidney disease, or unspecified chronic kidney disease: Secondary | ICD-10-CM | POA: Diagnosis not present

## 2022-01-09 DIAGNOSIS — R4702 Dysphasia: Secondary | ICD-10-CM | POA: Diagnosis not present

## 2022-01-09 DIAGNOSIS — I6622 Occlusion and stenosis of left posterior cerebral artery: Secondary | ICD-10-CM | POA: Diagnosis not present

## 2022-01-09 DIAGNOSIS — Z1611 Resistance to penicillins: Secondary | ICD-10-CM | POA: Diagnosis not present

## 2022-01-09 DIAGNOSIS — I6602 Occlusion and stenosis of left middle cerebral artery: Secondary | ICD-10-CM | POA: Diagnosis not present

## 2022-01-09 DIAGNOSIS — Z4682 Encounter for fitting and adjustment of non-vascular catheter: Secondary | ICD-10-CM | POA: Diagnosis not present

## 2022-01-09 DIAGNOSIS — F32A Depression, unspecified: Secondary | ICD-10-CM | POA: Diagnosis not present

## 2022-01-09 DIAGNOSIS — R278 Other lack of coordination: Secondary | ICD-10-CM | POA: Diagnosis not present

## 2022-01-09 DIAGNOSIS — I69391 Dysphagia following cerebral infarction: Secondary | ICD-10-CM | POA: Diagnosis not present

## 2022-01-09 DIAGNOSIS — Z7409 Other reduced mobility: Secondary | ICD-10-CM | POA: Diagnosis not present

## 2022-01-09 DIAGNOSIS — I69319 Unspecified symptoms and signs involving cognitive functions following cerebral infarction: Secondary | ICD-10-CM | POA: Diagnosis not present

## 2022-01-09 DIAGNOSIS — R4182 Altered mental status, unspecified: Secondary | ICD-10-CM | POA: Diagnosis not present

## 2022-01-09 DIAGNOSIS — R4701 Aphasia: Secondary | ICD-10-CM | POA: Diagnosis not present

## 2022-01-09 DIAGNOSIS — Z7982 Long term (current) use of aspirin: Secondary | ICD-10-CM | POA: Diagnosis not present

## 2022-01-09 DIAGNOSIS — I6503 Occlusion and stenosis of bilateral vertebral arteries: Secondary | ICD-10-CM | POA: Diagnosis not present

## 2022-01-09 DIAGNOSIS — Z7901 Long term (current) use of anticoagulants: Secondary | ICD-10-CM | POA: Diagnosis not present

## 2022-01-09 DIAGNOSIS — R2681 Unsteadiness on feet: Secondary | ICD-10-CM | POA: Diagnosis not present

## 2022-01-09 DIAGNOSIS — Z87891 Personal history of nicotine dependence: Secondary | ICD-10-CM | POA: Diagnosis not present

## 2022-01-09 DIAGNOSIS — Z8673 Personal history of transient ischemic attack (TIA), and cerebral infarction without residual deficits: Secondary | ICD-10-CM | POA: Diagnosis not present

## 2022-01-09 DIAGNOSIS — I639 Cerebral infarction, unspecified: Secondary | ICD-10-CM | POA: Diagnosis not present

## 2022-01-09 DIAGNOSIS — D696 Thrombocytopenia, unspecified: Secondary | ICD-10-CM | POA: Diagnosis not present

## 2022-01-09 DIAGNOSIS — I69351 Hemiplegia and hemiparesis following cerebral infarction affecting right dominant side: Secondary | ICD-10-CM | POA: Diagnosis not present

## 2022-01-09 DIAGNOSIS — Z86718 Personal history of other venous thrombosis and embolism: Secondary | ICD-10-CM | POA: Diagnosis not present

## 2022-01-09 DIAGNOSIS — I700 Atherosclerosis of aorta: Secondary | ICD-10-CM | POA: Diagnosis not present

## 2022-01-09 DIAGNOSIS — Z23 Encounter for immunization: Secondary | ICD-10-CM | POA: Diagnosis not present

## 2022-01-09 DIAGNOSIS — L89892 Pressure ulcer of other site, stage 2: Secondary | ICD-10-CM | POA: Diagnosis not present

## 2022-01-09 DIAGNOSIS — M6281 Muscle weakness (generalized): Secondary | ICD-10-CM | POA: Diagnosis not present

## 2022-01-09 DIAGNOSIS — K449 Diaphragmatic hernia without obstruction or gangrene: Secondary | ICD-10-CM | POA: Diagnosis not present

## 2022-01-09 DIAGNOSIS — I6381 Other cerebral infarction due to occlusion or stenosis of small artery: Secondary | ICD-10-CM | POA: Diagnosis not present

## 2022-01-10 DIAGNOSIS — I351 Nonrheumatic aortic (valve) insufficiency: Secondary | ICD-10-CM | POA: Diagnosis not present

## 2022-01-13 DIAGNOSIS — Z7982 Long term (current) use of aspirin: Secondary | ICD-10-CM | POA: Diagnosis not present

## 2022-01-13 DIAGNOSIS — M1990 Unspecified osteoarthritis, unspecified site: Secondary | ICD-10-CM | POA: Diagnosis not present

## 2022-01-13 DIAGNOSIS — D696 Thrombocytopenia, unspecified: Secondary | ICD-10-CM | POA: Diagnosis not present

## 2022-01-13 DIAGNOSIS — Z87891 Personal history of nicotine dependence: Secondary | ICD-10-CM | POA: Diagnosis not present

## 2022-01-13 DIAGNOSIS — I700 Atherosclerosis of aorta: Secondary | ICD-10-CM | POA: Diagnosis not present

## 2022-01-13 DIAGNOSIS — I679 Cerebrovascular disease, unspecified: Secondary | ICD-10-CM | POA: Diagnosis not present

## 2022-01-13 DIAGNOSIS — R69 Illness, unspecified: Secondary | ICD-10-CM | POA: Diagnosis not present

## 2022-01-13 DIAGNOSIS — G459 Transient cerebral ischemic attack, unspecified: Secondary | ICD-10-CM | POA: Diagnosis not present

## 2022-01-13 DIAGNOSIS — R4182 Altered mental status, unspecified: Secondary | ICD-10-CM | POA: Diagnosis not present

## 2022-01-13 DIAGNOSIS — L89892 Pressure ulcer of other site, stage 2: Secondary | ICD-10-CM | POA: Diagnosis not present

## 2022-01-13 DIAGNOSIS — R4702 Dysphasia: Secondary | ICD-10-CM | POA: Diagnosis not present

## 2022-01-13 DIAGNOSIS — R791 Abnormal coagulation profile: Secondary | ICD-10-CM | POA: Diagnosis not present

## 2022-01-13 DIAGNOSIS — R9431 Abnormal electrocardiogram [ECG] [EKG]: Secondary | ICD-10-CM | POA: Diagnosis not present

## 2022-01-13 DIAGNOSIS — R2681 Unsteadiness on feet: Secondary | ICD-10-CM | POA: Diagnosis not present

## 2022-01-13 DIAGNOSIS — B9620 Unspecified Escherichia coli [E. coli] as the cause of diseases classified elsewhere: Secondary | ICD-10-CM | POA: Diagnosis not present

## 2022-01-13 DIAGNOSIS — I82891 Chronic embolism and thrombosis of other specified veins: Secondary | ICD-10-CM | POA: Diagnosis not present

## 2022-01-13 DIAGNOSIS — M6281 Muscle weakness (generalized): Secondary | ICD-10-CM | POA: Diagnosis not present

## 2022-01-13 DIAGNOSIS — L89150 Pressure ulcer of sacral region, unstageable: Secondary | ICD-10-CM | POA: Diagnosis not present

## 2022-01-13 DIAGNOSIS — U071 COVID-19: Secondary | ICD-10-CM | POA: Diagnosis not present

## 2022-01-13 DIAGNOSIS — R29810 Facial weakness: Secondary | ICD-10-CM | POA: Diagnosis not present

## 2022-01-13 DIAGNOSIS — E7800 Pure hypercholesterolemia, unspecified: Secondary | ICD-10-CM | POA: Diagnosis not present

## 2022-01-13 DIAGNOSIS — B372 Candidiasis of skin and nail: Secondary | ICD-10-CM | POA: Diagnosis not present

## 2022-01-13 DIAGNOSIS — Z1619 Resistance to other specified beta lactam antibiotics: Secondary | ICD-10-CM | POA: Diagnosis not present

## 2022-01-13 DIAGNOSIS — R1310 Dysphagia, unspecified: Secondary | ICD-10-CM | POA: Diagnosis not present

## 2022-01-13 DIAGNOSIS — Z23 Encounter for immunization: Secondary | ICD-10-CM | POA: Diagnosis not present

## 2022-01-13 DIAGNOSIS — I129 Hypertensive chronic kidney disease with stage 1 through stage 4 chronic kidney disease, or unspecified chronic kidney disease: Secondary | ICD-10-CM | POA: Diagnosis not present

## 2022-01-13 DIAGNOSIS — Z1611 Resistance to penicillins: Secondary | ICD-10-CM | POA: Diagnosis not present

## 2022-01-13 DIAGNOSIS — I639 Cerebral infarction, unspecified: Secondary | ICD-10-CM | POA: Diagnosis not present

## 2022-01-13 DIAGNOSIS — Z8673 Personal history of transient ischemic attack (TIA), and cerebral infarction without residual deficits: Secondary | ICD-10-CM | POA: Diagnosis not present

## 2022-01-13 DIAGNOSIS — Z79899 Other long term (current) drug therapy: Secondary | ICD-10-CM | POA: Diagnosis not present

## 2022-01-13 DIAGNOSIS — R4701 Aphasia: Secondary | ICD-10-CM | POA: Diagnosis not present

## 2022-01-13 DIAGNOSIS — R296 Repeated falls: Secondary | ICD-10-CM | POA: Diagnosis not present

## 2022-01-13 DIAGNOSIS — D72829 Elevated white blood cell count, unspecified: Secondary | ICD-10-CM | POA: Diagnosis not present

## 2022-01-13 DIAGNOSIS — I352 Nonrheumatic aortic (valve) stenosis with insufficiency: Secondary | ICD-10-CM | POA: Diagnosis not present

## 2022-01-13 DIAGNOSIS — Z87440 Personal history of urinary (tract) infections: Secondary | ICD-10-CM | POA: Diagnosis not present

## 2022-01-13 DIAGNOSIS — R5383 Other fatigue: Secondary | ICD-10-CM | POA: Diagnosis not present

## 2022-01-13 DIAGNOSIS — R278 Other lack of coordination: Secondary | ICD-10-CM | POA: Diagnosis not present

## 2022-01-13 DIAGNOSIS — Z7901 Long term (current) use of anticoagulants: Secondary | ICD-10-CM | POA: Diagnosis not present

## 2022-01-13 DIAGNOSIS — Z86718 Personal history of other venous thrombosis and embolism: Secondary | ICD-10-CM | POA: Diagnosis not present

## 2022-01-13 DIAGNOSIS — D509 Iron deficiency anemia, unspecified: Secondary | ICD-10-CM | POA: Diagnosis not present

## 2022-01-13 DIAGNOSIS — I69391 Dysphagia following cerebral infarction: Secondary | ICD-10-CM | POA: Diagnosis not present

## 2022-01-13 DIAGNOSIS — K449 Diaphragmatic hernia without obstruction or gangrene: Secondary | ICD-10-CM | POA: Diagnosis not present

## 2022-01-13 DIAGNOSIS — R102 Pelvic and perineal pain: Secondary | ICD-10-CM | POA: Diagnosis not present

## 2022-01-13 DIAGNOSIS — R195 Other fecal abnormalities: Secondary | ICD-10-CM | POA: Diagnosis not present

## 2022-01-13 DIAGNOSIS — R531 Weakness: Secondary | ICD-10-CM | POA: Diagnosis not present

## 2022-01-13 DIAGNOSIS — M545 Low back pain, unspecified: Secondary | ICD-10-CM | POA: Diagnosis not present

## 2022-01-13 DIAGNOSIS — I10 Essential (primary) hypertension: Secondary | ICD-10-CM | POA: Diagnosis not present

## 2022-01-13 DIAGNOSIS — R29725 NIHSS score 25: Secondary | ICD-10-CM | POA: Diagnosis not present

## 2022-01-13 DIAGNOSIS — Z7409 Other reduced mobility: Secondary | ICD-10-CM | POA: Diagnosis not present

## 2022-01-13 DIAGNOSIS — I69351 Hemiplegia and hemiparesis following cerebral infarction affecting right dominant side: Secondary | ICD-10-CM | POA: Diagnosis not present

## 2022-01-13 DIAGNOSIS — R7889 Finding of other specified substances, not normally found in blood: Secondary | ICD-10-CM | POA: Diagnosis not present

## 2022-01-13 DIAGNOSIS — F32A Depression, unspecified: Secondary | ICD-10-CM | POA: Diagnosis not present

## 2022-01-13 DIAGNOSIS — N390 Urinary tract infection, site not specified: Secondary | ICD-10-CM | POA: Diagnosis not present

## 2022-01-13 DIAGNOSIS — Z7401 Bed confinement status: Secondary | ICD-10-CM | POA: Diagnosis not present

## 2022-01-13 DIAGNOSIS — I69319 Unspecified symptoms and signs involving cognitive functions following cerebral infarction: Secondary | ICD-10-CM | POA: Diagnosis not present

## 2022-01-13 DIAGNOSIS — N179 Acute kidney failure, unspecified: Secondary | ICD-10-CM | POA: Diagnosis not present

## 2022-01-13 DIAGNOSIS — N1831 Chronic kidney disease, stage 3a: Secondary | ICD-10-CM | POA: Diagnosis not present

## 2022-01-13 DIAGNOSIS — Z882 Allergy status to sulfonamides status: Secondary | ICD-10-CM | POA: Diagnosis not present

## 2022-01-13 DIAGNOSIS — I6381 Other cerebral infarction due to occlusion or stenosis of small artery: Secondary | ICD-10-CM | POA: Diagnosis not present

## 2022-01-13 DIAGNOSIS — G8191 Hemiplegia, unspecified affecting right dominant side: Secondary | ICD-10-CM | POA: Diagnosis not present

## 2022-01-13 DIAGNOSIS — M25551 Pain in right hip: Secondary | ICD-10-CM | POA: Diagnosis not present

## 2022-01-13 DIAGNOSIS — L89152 Pressure ulcer of sacral region, stage 2: Secondary | ICD-10-CM | POA: Diagnosis not present

## 2022-01-13 DIAGNOSIS — M6259 Muscle wasting and atrophy, not elsewhere classified, multiple sites: Secondary | ICD-10-CM | POA: Diagnosis not present

## 2022-01-13 DIAGNOSIS — W19XXXA Unspecified fall, initial encounter: Secondary | ICD-10-CM | POA: Diagnosis not present

## 2022-01-13 DIAGNOSIS — M25552 Pain in left hip: Secondary | ICD-10-CM | POA: Diagnosis not present

## 2022-01-14 DIAGNOSIS — I10 Essential (primary) hypertension: Secondary | ICD-10-CM | POA: Diagnosis not present

## 2022-01-14 DIAGNOSIS — I639 Cerebral infarction, unspecified: Secondary | ICD-10-CM | POA: Diagnosis not present

## 2022-01-14 DIAGNOSIS — N1831 Chronic kidney disease, stage 3a: Secondary | ICD-10-CM | POA: Diagnosis not present

## 2022-01-14 DIAGNOSIS — N179 Acute kidney failure, unspecified: Secondary | ICD-10-CM | POA: Diagnosis not present

## 2022-01-17 ENCOUNTER — Telehealth: Payer: Self-pay

## 2022-01-17 DIAGNOSIS — D696 Thrombocytopenia, unspecified: Secondary | ICD-10-CM | POA: Diagnosis not present

## 2022-01-17 DIAGNOSIS — R1310 Dysphagia, unspecified: Secondary | ICD-10-CM | POA: Diagnosis not present

## 2022-01-17 DIAGNOSIS — I10 Essential (primary) hypertension: Secondary | ICD-10-CM | POA: Diagnosis not present

## 2022-01-17 DIAGNOSIS — I679 Cerebrovascular disease, unspecified: Secondary | ICD-10-CM | POA: Diagnosis not present

## 2022-01-17 NOTE — Telephone Encounter (Signed)
VM full

## 2022-01-18 ENCOUNTER — Telehealth: Payer: Self-pay

## 2022-01-18 NOTE — Telephone Encounter (Signed)
Unable to reach the patient, I mailed a letter requesting a call back.  

## 2022-01-18 NOTE — Telephone Encounter (Signed)
-----   Message from Park Liter, MD sent at 01/10/2022  1:27 PM EDT ----- Echocardiogram showing preserved left ventricle ejection fraction, mild left atrial enlargement mild mitral valve regurgitation all of this is acceptable medical therapy she does have also moderate aortic stenosis medical therapy

## 2022-01-18 NOTE — Telephone Encounter (Signed)
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

## 2022-01-23 DIAGNOSIS — Z86718 Personal history of other venous thrombosis and embolism: Secondary | ICD-10-CM | POA: Diagnosis not present

## 2022-01-23 DIAGNOSIS — D696 Thrombocytopenia, unspecified: Secondary | ICD-10-CM | POA: Diagnosis not present

## 2022-01-23 DIAGNOSIS — I10 Essential (primary) hypertension: Secondary | ICD-10-CM | POA: Diagnosis not present

## 2022-01-23 DIAGNOSIS — I639 Cerebral infarction, unspecified: Secondary | ICD-10-CM | POA: Diagnosis not present

## 2022-01-28 DIAGNOSIS — R7889 Finding of other specified substances, not normally found in blood: Secondary | ICD-10-CM | POA: Diagnosis not present

## 2022-01-28 DIAGNOSIS — R195 Other fecal abnormalities: Secondary | ICD-10-CM | POA: Diagnosis not present

## 2022-01-28 DIAGNOSIS — N179 Acute kidney failure, unspecified: Secondary | ICD-10-CM | POA: Diagnosis not present

## 2022-01-28 DIAGNOSIS — K449 Diaphragmatic hernia without obstruction or gangrene: Secondary | ICD-10-CM | POA: Diagnosis not present

## 2022-01-28 DIAGNOSIS — I352 Nonrheumatic aortic (valve) stenosis with insufficiency: Secondary | ICD-10-CM | POA: Diagnosis not present

## 2022-01-28 DIAGNOSIS — Z86718 Personal history of other venous thrombosis and embolism: Secondary | ICD-10-CM | POA: Diagnosis not present

## 2022-01-28 DIAGNOSIS — I69351 Hemiplegia and hemiparesis following cerebral infarction affecting right dominant side: Secondary | ICD-10-CM | POA: Diagnosis not present

## 2022-01-28 DIAGNOSIS — D509 Iron deficiency anemia, unspecified: Secondary | ICD-10-CM | POA: Diagnosis not present

## 2022-01-28 DIAGNOSIS — Z882 Allergy status to sulfonamides status: Secondary | ICD-10-CM | POA: Diagnosis not present

## 2022-01-28 DIAGNOSIS — B372 Candidiasis of skin and nail: Secondary | ICD-10-CM | POA: Diagnosis not present

## 2022-01-28 DIAGNOSIS — I6381 Other cerebral infarction due to occlusion or stenosis of small artery: Secondary | ICD-10-CM | POA: Diagnosis not present

## 2022-01-28 DIAGNOSIS — R5383 Other fatigue: Secondary | ICD-10-CM | POA: Diagnosis not present

## 2022-01-28 DIAGNOSIS — R69 Illness, unspecified: Secondary | ICD-10-CM | POA: Diagnosis not present

## 2022-01-28 DIAGNOSIS — M1990 Unspecified osteoarthritis, unspecified site: Secondary | ICD-10-CM | POA: Diagnosis not present

## 2022-01-28 DIAGNOSIS — Z79899 Other long term (current) drug therapy: Secondary | ICD-10-CM | POA: Diagnosis not present

## 2022-01-28 DIAGNOSIS — Z7982 Long term (current) use of aspirin: Secondary | ICD-10-CM | POA: Diagnosis not present

## 2022-01-28 DIAGNOSIS — R791 Abnormal coagulation profile: Secondary | ICD-10-CM | POA: Diagnosis not present

## 2022-01-28 DIAGNOSIS — Z7401 Bed confinement status: Secondary | ICD-10-CM | POA: Diagnosis not present

## 2022-01-28 DIAGNOSIS — R531 Weakness: Secondary | ICD-10-CM | POA: Diagnosis not present

## 2022-01-28 DIAGNOSIS — N1831 Chronic kidney disease, stage 3a: Secondary | ICD-10-CM | POA: Diagnosis not present

## 2022-01-28 DIAGNOSIS — D72829 Elevated white blood cell count, unspecified: Secondary | ICD-10-CM | POA: Diagnosis not present

## 2022-01-28 DIAGNOSIS — I10 Essential (primary) hypertension: Secondary | ICD-10-CM | POA: Diagnosis not present

## 2022-01-28 DIAGNOSIS — I129 Hypertensive chronic kidney disease with stage 1 through stage 4 chronic kidney disease, or unspecified chronic kidney disease: Secondary | ICD-10-CM | POA: Diagnosis not present

## 2022-01-28 DIAGNOSIS — Z7901 Long term (current) use of anticoagulants: Secondary | ICD-10-CM | POA: Diagnosis not present

## 2022-01-28 DIAGNOSIS — E7800 Pure hypercholesterolemia, unspecified: Secondary | ICD-10-CM | POA: Diagnosis not present

## 2022-01-28 DIAGNOSIS — F32A Depression, unspecified: Secondary | ICD-10-CM | POA: Diagnosis not present

## 2022-01-28 DIAGNOSIS — Z87891 Personal history of nicotine dependence: Secondary | ICD-10-CM | POA: Diagnosis not present

## 2022-01-28 DIAGNOSIS — R9431 Abnormal electrocardiogram [ECG] [EKG]: Secondary | ICD-10-CM | POA: Diagnosis not present

## 2022-01-28 DIAGNOSIS — Z87440 Personal history of urinary (tract) infections: Secondary | ICD-10-CM | POA: Diagnosis not present

## 2022-02-02 DIAGNOSIS — Z86718 Personal history of other venous thrombosis and embolism: Secondary | ICD-10-CM | POA: Diagnosis not present

## 2022-02-02 DIAGNOSIS — W19XXXA Unspecified fall, initial encounter: Secondary | ICD-10-CM | POA: Diagnosis not present

## 2022-02-03 DIAGNOSIS — R4182 Altered mental status, unspecified: Secondary | ICD-10-CM | POA: Diagnosis not present

## 2022-02-03 DIAGNOSIS — R296 Repeated falls: Secondary | ICD-10-CM | POA: Diagnosis not present

## 2022-02-03 DIAGNOSIS — Z86718 Personal history of other venous thrombosis and embolism: Secondary | ICD-10-CM | POA: Diagnosis not present

## 2022-02-03 DIAGNOSIS — I639 Cerebral infarction, unspecified: Secondary | ICD-10-CM | POA: Diagnosis not present

## 2022-02-04 DIAGNOSIS — R791 Abnormal coagulation profile: Secondary | ICD-10-CM | POA: Diagnosis not present

## 2022-02-04 DIAGNOSIS — I10 Essential (primary) hypertension: Secondary | ICD-10-CM | POA: Diagnosis not present

## 2022-02-04 DIAGNOSIS — L89152 Pressure ulcer of sacral region, stage 2: Secondary | ICD-10-CM | POA: Diagnosis not present

## 2022-02-06 DIAGNOSIS — R4182 Altered mental status, unspecified: Secondary | ICD-10-CM | POA: Diagnosis not present

## 2022-02-06 DIAGNOSIS — L89152 Pressure ulcer of sacral region, stage 2: Secondary | ICD-10-CM | POA: Diagnosis not present

## 2022-02-06 DIAGNOSIS — I639 Cerebral infarction, unspecified: Secondary | ICD-10-CM | POA: Diagnosis not present

## 2022-02-06 DIAGNOSIS — Z86718 Personal history of other venous thrombosis and embolism: Secondary | ICD-10-CM | POA: Diagnosis not present

## 2022-02-07 DIAGNOSIS — Z86718 Personal history of other venous thrombosis and embolism: Secondary | ICD-10-CM | POA: Diagnosis not present

## 2022-02-07 DIAGNOSIS — I639 Cerebral infarction, unspecified: Secondary | ICD-10-CM | POA: Diagnosis not present

## 2022-02-07 DIAGNOSIS — N390 Urinary tract infection, site not specified: Secondary | ICD-10-CM | POA: Diagnosis not present

## 2022-02-08 DIAGNOSIS — L89152 Pressure ulcer of sacral region, stage 2: Secondary | ICD-10-CM | POA: Diagnosis not present

## 2022-02-10 DIAGNOSIS — Z86718 Personal history of other venous thrombosis and embolism: Secondary | ICD-10-CM | POA: Diagnosis not present

## 2022-02-10 DIAGNOSIS — I639 Cerebral infarction, unspecified: Secondary | ICD-10-CM | POA: Diagnosis not present

## 2022-02-10 DIAGNOSIS — L89152 Pressure ulcer of sacral region, stage 2: Secondary | ICD-10-CM | POA: Diagnosis not present

## 2022-02-10 DIAGNOSIS — N390 Urinary tract infection, site not specified: Secondary | ICD-10-CM | POA: Diagnosis not present

## 2022-02-11 DIAGNOSIS — Z86718 Personal history of other venous thrombosis and embolism: Secondary | ICD-10-CM | POA: Diagnosis not present

## 2022-02-11 DIAGNOSIS — N179 Acute kidney failure, unspecified: Secondary | ICD-10-CM | POA: Diagnosis not present

## 2022-02-11 DIAGNOSIS — L89152 Pressure ulcer of sacral region, stage 2: Secondary | ICD-10-CM | POA: Diagnosis not present

## 2022-02-11 DIAGNOSIS — I639 Cerebral infarction, unspecified: Secondary | ICD-10-CM | POA: Diagnosis not present

## 2022-02-14 DIAGNOSIS — N179 Acute kidney failure, unspecified: Secondary | ICD-10-CM | POA: Diagnosis not present

## 2022-02-14 DIAGNOSIS — N390 Urinary tract infection, site not specified: Secondary | ICD-10-CM | POA: Diagnosis not present

## 2022-02-14 DIAGNOSIS — I639 Cerebral infarction, unspecified: Secondary | ICD-10-CM | POA: Diagnosis not present

## 2022-02-14 DIAGNOSIS — L89152 Pressure ulcer of sacral region, stage 2: Secondary | ICD-10-CM | POA: Diagnosis not present

## 2022-02-15 DIAGNOSIS — I639 Cerebral infarction, unspecified: Secondary | ICD-10-CM | POA: Diagnosis not present

## 2022-02-15 DIAGNOSIS — W19XXXA Unspecified fall, initial encounter: Secondary | ICD-10-CM | POA: Diagnosis not present

## 2022-02-16 DIAGNOSIS — I639 Cerebral infarction, unspecified: Secondary | ICD-10-CM | POA: Diagnosis not present

## 2022-02-16 DIAGNOSIS — L89150 Pressure ulcer of sacral region, unstageable: Secondary | ICD-10-CM | POA: Diagnosis not present

## 2022-02-16 DIAGNOSIS — N179 Acute kidney failure, unspecified: Secondary | ICD-10-CM | POA: Diagnosis not present

## 2022-02-16 DIAGNOSIS — N390 Urinary tract infection, site not specified: Secondary | ICD-10-CM | POA: Diagnosis not present

## 2022-02-20 DIAGNOSIS — I639 Cerebral infarction, unspecified: Secondary | ICD-10-CM | POA: Diagnosis not present

## 2022-02-20 DIAGNOSIS — R296 Repeated falls: Secondary | ICD-10-CM | POA: Diagnosis not present

## 2022-02-20 DIAGNOSIS — U071 COVID-19: Secondary | ICD-10-CM | POA: Diagnosis not present

## 2022-02-21 DIAGNOSIS — U071 COVID-19: Secondary | ICD-10-CM | POA: Diagnosis not present

## 2022-02-21 DIAGNOSIS — R296 Repeated falls: Secondary | ICD-10-CM | POA: Diagnosis not present

## 2022-02-22 DIAGNOSIS — Z7901 Long term (current) use of anticoagulants: Secondary | ICD-10-CM | POA: Diagnosis not present

## 2022-02-23 DIAGNOSIS — Z7901 Long term (current) use of anticoagulants: Secondary | ICD-10-CM | POA: Diagnosis not present

## 2022-02-25 DIAGNOSIS — I639 Cerebral infarction, unspecified: Secondary | ICD-10-CM | POA: Diagnosis not present

## 2022-02-27 DIAGNOSIS — I10 Essential (primary) hypertension: Secondary | ICD-10-CM | POA: Diagnosis not present

## 2022-02-27 DIAGNOSIS — E559 Vitamin D deficiency, unspecified: Secondary | ICD-10-CM | POA: Diagnosis not present

## 2022-02-28 DIAGNOSIS — E785 Hyperlipidemia, unspecified: Secondary | ICD-10-CM | POA: Diagnosis not present

## 2022-02-28 DIAGNOSIS — D649 Anemia, unspecified: Secondary | ICD-10-CM | POA: Diagnosis not present

## 2022-02-28 DIAGNOSIS — I639 Cerebral infarction, unspecified: Secondary | ICD-10-CM | POA: Diagnosis not present

## 2022-02-28 DIAGNOSIS — U071 COVID-19: Secondary | ICD-10-CM | POA: Diagnosis not present

## 2022-03-01 DIAGNOSIS — D649 Anemia, unspecified: Secondary | ICD-10-CM | POA: Diagnosis not present

## 2022-03-01 DIAGNOSIS — Z7901 Long term (current) use of anticoagulants: Secondary | ICD-10-CM | POA: Diagnosis not present

## 2022-03-02 DIAGNOSIS — D519 Vitamin B12 deficiency anemia, unspecified: Secondary | ICD-10-CM | POA: Diagnosis not present

## 2022-03-02 DIAGNOSIS — I639 Cerebral infarction, unspecified: Secondary | ICD-10-CM | POA: Diagnosis not present

## 2022-03-02 DIAGNOSIS — D649 Anemia, unspecified: Secondary | ICD-10-CM | POA: Diagnosis not present

## 2022-03-03 DIAGNOSIS — D649 Anemia, unspecified: Secondary | ICD-10-CM | POA: Diagnosis not present

## 2022-03-03 DIAGNOSIS — I639 Cerebral infarction, unspecified: Secondary | ICD-10-CM | POA: Diagnosis not present

## 2022-03-04 DIAGNOSIS — I639 Cerebral infarction, unspecified: Secondary | ICD-10-CM | POA: Diagnosis not present

## 2022-03-05 DIAGNOSIS — I639 Cerebral infarction, unspecified: Secondary | ICD-10-CM | POA: Diagnosis not present

## 2022-03-06 DIAGNOSIS — I639 Cerebral infarction, unspecified: Secondary | ICD-10-CM | POA: Diagnosis not present

## 2022-03-06 DIAGNOSIS — D649 Anemia, unspecified: Secondary | ICD-10-CM | POA: Diagnosis not present

## 2022-03-07 DIAGNOSIS — I639 Cerebral infarction, unspecified: Secondary | ICD-10-CM | POA: Diagnosis not present

## 2022-03-07 DIAGNOSIS — Z7901 Long term (current) use of anticoagulants: Secondary | ICD-10-CM | POA: Diagnosis not present

## 2022-03-10 DIAGNOSIS — D509 Iron deficiency anemia, unspecified: Secondary | ICD-10-CM | POA: Diagnosis not present

## 2022-03-13 ENCOUNTER — Telehealth: Payer: Self-pay | Admitting: Gastroenterology

## 2022-03-13 DIAGNOSIS — D649 Anemia, unspecified: Secondary | ICD-10-CM | POA: Diagnosis not present

## 2022-03-13 DIAGNOSIS — Z7901 Long term (current) use of anticoagulants: Secondary | ICD-10-CM | POA: Diagnosis not present

## 2022-03-13 NOTE — Telephone Encounter (Signed)
Urgent referral placed in [facility] on 03/07/22 Roberta Mercer supervising MD) for concern of a GI Bleed. Patient is on a bloodthinner.  Records in [facility]. Please advise urgency and scheduling.

## 2022-03-14 NOTE — Telephone Encounter (Signed)
Spoke with Morrie Sheldon at [facility] and scheduled pt for an Urgent referral: Pt was scheduled for an office visit with Dr. Chales Abrahams on 03/22/2022 at 8:50 AM. Morrie Sheldon made aware: Morrie Sheldon verbalized understanding with all questions answered.

## 2022-03-16 DIAGNOSIS — E785 Hyperlipidemia, unspecified: Secondary | ICD-10-CM | POA: Diagnosis not present

## 2022-03-16 DIAGNOSIS — U099 Post covid-19 condition, unspecified: Secondary | ICD-10-CM | POA: Diagnosis not present

## 2022-03-16 DIAGNOSIS — D649 Anemia, unspecified: Secondary | ICD-10-CM | POA: Diagnosis not present

## 2022-03-16 DIAGNOSIS — I4891 Unspecified atrial fibrillation: Secondary | ICD-10-CM | POA: Diagnosis not present

## 2022-03-16 DIAGNOSIS — I639 Cerebral infarction, unspecified: Secondary | ICD-10-CM | POA: Diagnosis not present

## 2022-03-17 DIAGNOSIS — Z7901 Long term (current) use of anticoagulants: Secondary | ICD-10-CM | POA: Diagnosis not present

## 2022-03-20 DIAGNOSIS — Z7901 Long term (current) use of anticoagulants: Secondary | ICD-10-CM | POA: Diagnosis not present

## 2022-03-20 DIAGNOSIS — Z79899 Other long term (current) drug therapy: Secondary | ICD-10-CM | POA: Diagnosis not present

## 2022-03-20 DIAGNOSIS — D649 Anemia, unspecified: Secondary | ICD-10-CM | POA: Diagnosis not present

## 2022-03-20 DIAGNOSIS — I10 Essential (primary) hypertension: Secondary | ICD-10-CM | POA: Diagnosis not present

## 2022-03-22 ENCOUNTER — Other Ambulatory Visit (INDEPENDENT_AMBULATORY_CARE_PROVIDER_SITE_OTHER): Payer: Medicare Other

## 2022-03-22 ENCOUNTER — Ambulatory Visit (INDEPENDENT_AMBULATORY_CARE_PROVIDER_SITE_OTHER): Payer: Medicare Other | Admitting: Gastroenterology

## 2022-03-22 DIAGNOSIS — Z681 Body mass index (BMI) 19 or less, adult: Secondary | ICD-10-CM | POA: Diagnosis not present

## 2022-03-22 DIAGNOSIS — I10 Essential (primary) hypertension: Secondary | ICD-10-CM | POA: Diagnosis not present

## 2022-03-22 DIAGNOSIS — N1831 Chronic kidney disease, stage 3a: Secondary | ICD-10-CM | POA: Diagnosis not present

## 2022-03-22 DIAGNOSIS — R55 Syncope and collapse: Secondary | ICD-10-CM | POA: Diagnosis not present

## 2022-03-22 DIAGNOSIS — D649 Anemia, unspecified: Secondary | ICD-10-CM

## 2022-03-22 DIAGNOSIS — Z7901 Long term (current) use of anticoagulants: Secondary | ICD-10-CM | POA: Diagnosis not present

## 2022-03-22 DIAGNOSIS — K449 Diaphragmatic hernia without obstruction or gangrene: Secondary | ICD-10-CM | POA: Diagnosis not present

## 2022-03-22 DIAGNOSIS — K8020 Calculus of gallbladder without cholecystitis without obstruction: Secondary | ICD-10-CM | POA: Diagnosis not present

## 2022-03-22 DIAGNOSIS — Z7409 Other reduced mobility: Secondary | ICD-10-CM | POA: Diagnosis not present

## 2022-03-22 DIAGNOSIS — Z87891 Personal history of nicotine dependence: Secondary | ICD-10-CM | POA: Diagnosis not present

## 2022-03-22 DIAGNOSIS — N390 Urinary tract infection, site not specified: Secondary | ICD-10-CM | POA: Diagnosis not present

## 2022-03-22 DIAGNOSIS — I639 Cerebral infarction, unspecified: Secondary | ICD-10-CM | POA: Diagnosis not present

## 2022-03-22 DIAGNOSIS — Z79899 Other long term (current) drug therapy: Secondary | ICD-10-CM | POA: Diagnosis not present

## 2022-03-22 DIAGNOSIS — R2681 Unsteadiness on feet: Secondary | ICD-10-CM | POA: Diagnosis not present

## 2022-03-22 DIAGNOSIS — D631 Anemia in chronic kidney disease: Secondary | ICD-10-CM | POA: Diagnosis not present

## 2022-03-22 DIAGNOSIS — Z87440 Personal history of urinary (tract) infections: Secondary | ICD-10-CM | POA: Diagnosis not present

## 2022-03-22 DIAGNOSIS — F32A Depression, unspecified: Secondary | ICD-10-CM | POA: Diagnosis not present

## 2022-03-22 DIAGNOSIS — B952 Enterococcus as the cause of diseases classified elsewhere: Secondary | ICD-10-CM | POA: Diagnosis not present

## 2022-03-22 DIAGNOSIS — G9341 Metabolic encephalopathy: Secondary | ICD-10-CM | POA: Diagnosis not present

## 2022-03-22 DIAGNOSIS — I700 Atherosclerosis of aorta: Secondary | ICD-10-CM | POA: Diagnosis not present

## 2022-03-22 DIAGNOSIS — M1990 Unspecified osteoarthritis, unspecified site: Secondary | ICD-10-CM | POA: Diagnosis not present

## 2022-03-22 DIAGNOSIS — Z7982 Long term (current) use of aspirin: Secondary | ICD-10-CM | POA: Diagnosis not present

## 2022-03-22 DIAGNOSIS — I69351 Hemiplegia and hemiparesis following cerebral infarction affecting right dominant side: Secondary | ICD-10-CM | POA: Diagnosis not present

## 2022-03-22 DIAGNOSIS — I129 Hypertensive chronic kidney disease with stage 1 through stage 4 chronic kidney disease, or unspecified chronic kidney disease: Secondary | ICD-10-CM | POA: Diagnosis not present

## 2022-03-22 DIAGNOSIS — K3189 Other diseases of stomach and duodenum: Secondary | ICD-10-CM | POA: Diagnosis not present

## 2022-03-22 DIAGNOSIS — Z882 Allergy status to sulfonamides status: Secondary | ICD-10-CM | POA: Diagnosis not present

## 2022-03-22 DIAGNOSIS — I82891 Chronic embolism and thrombosis of other specified veins: Secondary | ICD-10-CM | POA: Diagnosis not present

## 2022-03-22 DIAGNOSIS — K5289 Other specified noninfective gastroenteritis and colitis: Secondary | ICD-10-CM | POA: Diagnosis not present

## 2022-03-22 DIAGNOSIS — M6259 Muscle wasting and atrophy, not elsewhere classified, multiple sites: Secondary | ICD-10-CM | POA: Diagnosis not present

## 2022-03-22 DIAGNOSIS — R4701 Aphasia: Secondary | ICD-10-CM | POA: Diagnosis not present

## 2022-03-22 DIAGNOSIS — I69319 Unspecified symptoms and signs involving cognitive functions following cerebral infarction: Secondary | ICD-10-CM | POA: Diagnosis not present

## 2022-03-22 DIAGNOSIS — Z86718 Personal history of other venous thrombosis and embolism: Secondary | ICD-10-CM | POA: Diagnosis not present

## 2022-03-22 DIAGNOSIS — E43 Unspecified severe protein-calorie malnutrition: Secondary | ICD-10-CM | POA: Diagnosis not present

## 2022-03-22 DIAGNOSIS — M6281 Muscle weakness (generalized): Secondary | ICD-10-CM | POA: Diagnosis not present

## 2022-03-22 DIAGNOSIS — L89892 Pressure ulcer of other site, stage 2: Secondary | ICD-10-CM | POA: Diagnosis not present

## 2022-03-22 DIAGNOSIS — N179 Acute kidney failure, unspecified: Secondary | ICD-10-CM | POA: Diagnosis not present

## 2022-03-22 DIAGNOSIS — R278 Other lack of coordination: Secondary | ICD-10-CM | POA: Diagnosis not present

## 2022-03-22 DIAGNOSIS — N3001 Acute cystitis with hematuria: Secondary | ICD-10-CM | POA: Diagnosis not present

## 2022-03-22 DIAGNOSIS — E7800 Pure hypercholesterolemia, unspecified: Secondary | ICD-10-CM | POA: Diagnosis not present

## 2022-03-22 DIAGNOSIS — R1310 Dysphagia, unspecified: Secondary | ICD-10-CM | POA: Diagnosis not present

## 2022-03-22 DIAGNOSIS — E860 Dehydration: Secondary | ICD-10-CM | POA: Diagnosis not present

## 2022-03-22 DIAGNOSIS — Z7401 Bed confinement status: Secondary | ICD-10-CM | POA: Diagnosis not present

## 2022-03-22 DIAGNOSIS — E872 Acidosis, unspecified: Secondary | ICD-10-CM | POA: Diagnosis not present

## 2022-03-22 DIAGNOSIS — I69320 Aphasia following cerebral infarction: Secondary | ICD-10-CM | POA: Diagnosis not present

## 2022-03-22 DIAGNOSIS — I69391 Dysphagia following cerebral infarction: Secondary | ICD-10-CM | POA: Diagnosis not present

## 2022-03-22 LAB — CBC WITH DIFFERENTIAL/PLATELET
Basophils Absolute: 0.1 10*3/uL (ref 0.0–0.1)
Basophils Relative: 0.5 % (ref 0.0–3.0)
Eosinophils Absolute: 0.1 10*3/uL (ref 0.0–0.7)
Eosinophils Relative: 0.6 % (ref 0.0–5.0)
HCT: 32.9 % — ABNORMAL LOW (ref 36.0–46.0)
Hemoglobin: 10.1 g/dL — ABNORMAL LOW (ref 12.0–15.0)
Lymphocytes Relative: 41.1 % (ref 12.0–46.0)
Lymphs Abs: 5.7 10*3/uL — ABNORMAL HIGH (ref 0.7–4.0)
MCHC: 30.8 g/dL (ref 30.0–36.0)
MCV: 85.4 fl (ref 78.0–100.0)
Monocytes Absolute: 0.7 10*3/uL (ref 0.1–1.0)
Monocytes Relative: 4.8 % (ref 3.0–12.0)
Neutro Abs: 7.3 10*3/uL (ref 1.4–7.7)
Neutrophils Relative %: 53 % (ref 43.0–77.0)
Platelets: 329 10*3/uL (ref 150.0–400.0)
RBC: 3.85 Mil/uL — ABNORMAL LOW (ref 3.87–5.11)
RDW: 20.2 % — ABNORMAL HIGH (ref 11.5–15.5)
WBC: 13.8 10*3/uL — ABNORMAL HIGH (ref 4.0–10.5)

## 2022-03-22 LAB — COMPREHENSIVE METABOLIC PANEL
ALT: 12 U/L (ref 0–35)
AST: 15 U/L (ref 0–37)
Albumin: 3.1 g/dL — ABNORMAL LOW (ref 3.5–5.2)
Alkaline Phosphatase: 77 U/L (ref 39–117)
BUN: 48 mg/dL — ABNORMAL HIGH (ref 6–23)
CO2: 25 mEq/L (ref 19–32)
Calcium: 11.1 mg/dL — ABNORMAL HIGH (ref 8.4–10.5)
Chloride: 107 mEq/L (ref 96–112)
Creatinine, Ser: 1.15 mg/dL (ref 0.40–1.20)
GFR: 45.03 mL/min — ABNORMAL LOW (ref >60.00)
Glucose, Bld: 129 mg/dL — ABNORMAL HIGH (ref 70–99)
Potassium: 4 mEq/L (ref 3.5–5.1)
Sodium: 141 mEq/L (ref 135–145)
Total Bilirubin: 0.3 mg/dL (ref 0.2–1.2)
Total Protein: 7.7 g/dL (ref 6.0–8.3)

## 2022-03-22 LAB — PROTIME-INR
INR: 1.4 ratio — ABNORMAL HIGH (ref 0.8–1.0)
Prothrombin Time: 14.6 s — ABNORMAL HIGH (ref 9.6–13.1)

## 2022-03-22 NOTE — Progress Notes (Signed)
Chief Complaint: GI evaluation  Referring Provider:  Lise Auer, MD      ASSESSMENT AND PLAN;   #1. Anemia   #2. CVA on couamdin/ASA  #3. NH pt with dementia/agitation.   Plan: -CBC, CMP, INR stat. -Continue protonix 40mg  po BID -No elective endoscopic procedures.  I do not think that she could even tolerate CT at this time -Records regarding last physicians visit. -If any active bleeding, she needs to be referred to nearest ED for adm/further eval.    Today's labs:    Latest Ref Rng & Units 03/22/2022   10:00 AM  CBC  WBC 4.0 - 10.5 K/uL 13.8   Hemoglobin 12.0 - 15.0 g/dL 14/09/2021   Hematocrit 45.8 - 46.0 % 32.9   Platelets 150.0 - 400.0 K/uL 329.0       Latest Ref Rng & Units 03/22/2022   10:00 AM 05/26/2019    3:40 PM 05/05/2019    2:25 PM  CMP  Glucose 70 - 99 mg/dL 05/07/2019  82  93   BUN 6 - 23 mg/dL 48  32  30   Creatinine 0.40 - 1.20 mg/dL 924  4.62  8.63   Sodium 135 - 145 mEq/L 141  140  142   Potassium 3.5 - 5.1 mEq/L 4.0  4.6  4.6   Chloride 96 - 112 mEq/L 107  105  108   CO2 19 - 32 mEq/L 25  23  22    Calcium 8.4 - 10.5 mg/dL 8.17   71.1   Total Protein 6.0 - 8.3 g/dL 7.7     Total Bilirubin 0.2 - 1.2 mg/dL 0.3     Alkaline Phos 39 - 117 U/L 77     AST 0 - 37 U/L 15     ALT 0 - 35 U/L 12      INR/Prothrombin Time 1.4   HPI:    Roberta Mercer is a 81 y.o. female  With CVA/DVT/ on coumadin/asa (wheelchair bound) with aphasia/oropharyngeal dysphagia, CKD, HTN, HLD, agitation  NH pt - getting agitated- not accompained by any family   Apparently emergency workin for "GI bleed". Unreliable history. No physician notes available.   Says "No" to everything.  No nausea, vomiting, heartburn, regurgitation, odynophagia or dysphagia.  No significant diarrhea or constipation.  No melena or hematochezia. No unintentional weight loss. No abdominal pain.     Past Medical History:  Diagnosis Date   CKD (chronic kidney disease)    Diastolic  dysfunction 06/05/2019   DVT (deep venous thrombosis) (HCC)    Esophageal spasm    Essential hypertension 03/03/2019   Essential tremor    History of CVA (cerebrovascular accident) 05/11/2020   History of stroke 11/14/2014   History of venous thromboembolism    Hyperlipidemia    LVH (left ventricular hypertrophy) 06/05/2019   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   Nonrheumatic aortic valve stenosis 06/05/2019   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   Post-menopausal atrophic vaginitis    Stage 3a chronic kidney disease (HCC) 06/05/2019   Stroke (HCC)    TIA (transient ischemic attack)     Past Surgical History:  Procedure Laterality Date   CATARACT EXTRACTION     ELBOW SURGERY Left    to repair nerve/ not successful   TRIGGER FINGER RELEASE      Family History  Problem Relation Age of Onset   Heart disease Mother    Stroke Mother    Vascular Disease Maternal Grandmother  Stroke Maternal Grandmother     Social History   Tobacco Use   Smoking status: Former    Types: Cigarettes    Quit date: 1976    Years since quitting: 47.9   Smokeless tobacco: Never  Substance Use Topics   Alcohol use: Never   Drug use: Never    Current Outpatient Medications  Medication Sig Dispense Refill   Alpha-Lipoic Acid 300 MG TABS Take 300 mg by mouth 2 (two) times daily.     amLODipine (NORVASC) 10 MG tablet Take 10 mg by mouth daily.     aspirin EC 81 MG tablet Take 81 mg by mouth 2 (two) times daily. Swallow whole.     Borage, Borago officinalis, (BORAGE 1000 PO) Take 1 tablet by mouth daily.     Calcium Carb-Cholecalciferol (CALCIUM 600 + D PO) Take 1 tablet by mouth in the morning and at bedtime.     cephALEXin (KEFLEX) 500 MG capsule Take 500 mg by mouth 2 (two) times daily. UTI     cholecalciferol (VITAMIN D3) 25 MCG (1000 UT) tablet Take 1,000 Units by mouth daily.     COLLAGEN PO Take 2,000 mg by mouth 2 (two) times daily.     cyanocobalamin 1000 MCG  tablet Take 1,000 mcg by mouth 2 (two) times daily.     ferrous sulfate 325 (65 FE) MG EC tablet Take 325 mg by mouth daily.     folic acid (FOLVITE) 400 MCG tablet Take 400 mcg by mouth 2 (two) times daily.     Magnesium 250 MG TABS Take 250 mg by mouth 2 (two) times daily.     Omega-3 Fatty Acids (FISH OIL) 1000 MG CAPS Take 1,000 mg by mouth daily.      pyridOXINE (VITAMIN B-6) 100 MG tablet Take 100 mg by mouth daily.     rosuvastatin (CRESTOR) 20 MG tablet Take 20 mg by mouth daily.     thiamine 100 MG tablet Take 100 mg by mouth daily.     topiramate (TOPAMAX) 25 MG tablet Take 25 mg by mouth daily.     warfarin (COUMADIN) 2.5 MG tablet Take 2.5 mg by mouth daily. Additional 2.5 Monday's and Fridays     No current facility-administered medications for this visit.    Allergies  Allergen Reactions   Bactrim [Sulfamethoxazole-Trimethoprim]    Diovan [Valsartan]     Review of Systems:  unobtainable     Physical Exam:    There were no vitals taken for this visit. Wt Readings from Last 3 Encounters:  11/17/21 146 lb (66.2 kg)  06/16/21 141 lb (64 kg)  01/24/21 147 lb 9.6 oz (67 kg)   Constitutional: thin built Psychiatric: agitated  Cardiovascular: Normal rate, regular rhythm. No edema Pulmonary/chest: Effort normal and breath sounds normal. No wheezing, rales or rhonchi. Abdominal: Soft, nondistended. Nontender. Bowel sounds active throughout. There are no masses palpable. No hepatomegaly. Rectal: Deferred Skin: Skin is warm and dry. No rashes noted.  Data Reviewed: I have personally reviewed following labs and imaging studies  CBC:     No data to display          CMP:    Latest Ref Rng & Units 05/26/2019    3:40 PM 05/05/2019    2:25 PM 02/11/2019   11:59 AM  CMP  Glucose 65 - 99 mg/dL 82  93  90   BUN 8 - 27 mg/dL 32  30  28   Creatinine 0.57 - 1.00 mg/dL 1.61  1.03  0.96   Sodium 134 - 144 mmol/L 140  142  145   Potassium 3.5 - 5.2 mmol/L 4.6  4.6  4.3    Chloride 96 - 106 mmol/L 105  108  108   CO2 20 - 29 mmol/L 23  22  23    Calcium 8.7 - 10.3 mg/dL  95.0  93.2        67.1, MD 03/22/2022, 9:32 AM  Cc: 14/09/2021, MD

## 2022-03-22 NOTE — Patient Instructions (Signed)
_______________________________________________________  If you are age 80 or older, your body mass index should be between 23-30. Your There is no height or weight on file to calculate BMI. If this is out of the aforementioned range listed, please consider follow up with your Primary Care Provider.  If you are age 47 or younger, your body mass index should be between 19-25. Your There is no height or weight on file to calculate BMI. If this is out of the aformentioned range listed, please consider follow up with your Primary Care Provider.   ________________________________________________________  The [facility] providers would like to encourage you to use [facility] to communicate with providers for non-urgent requests or questions.  Due to long hold times on the telephone, sending your provider a message by [facility] may be a faster and more efficient way to get a response.  Please allow 48 business hours for a response.  Please remember that this is for non-urgent requests.  _______________________________________________________  Your provider has requested that you go to the basement level for lab work before leaving today. Press "B" on the elevator. The lab is located at the first door on the left as you exit the elevator.  Please have the nursing home call us regarding your appointment.  Thank you,  Dr. Lynann Bologna

## 2022-03-26 DIAGNOSIS — R296 Repeated falls: Secondary | ICD-10-CM | POA: Diagnosis not present

## 2022-03-26 DIAGNOSIS — E782 Mixed hyperlipidemia: Secondary | ICD-10-CM | POA: Diagnosis not present

## 2022-03-26 DIAGNOSIS — N1831 Chronic kidney disease, stage 3a: Secondary | ICD-10-CM | POA: Diagnosis not present

## 2022-03-26 DIAGNOSIS — K523 Indeterminate colitis: Secondary | ICD-10-CM | POA: Diagnosis not present

## 2022-03-26 DIAGNOSIS — R627 Adult failure to thrive: Secondary | ICD-10-CM | POA: Diagnosis not present

## 2022-03-26 DIAGNOSIS — R2681 Unsteadiness on feet: Secondary | ICD-10-CM | POA: Diagnosis not present

## 2022-03-26 DIAGNOSIS — N179 Acute kidney failure, unspecified: Secondary | ICD-10-CM | POA: Diagnosis not present

## 2022-03-26 DIAGNOSIS — I63512 Cerebral infarction due to unspecified occlusion or stenosis of left middle cerebral artery: Secondary | ICD-10-CM | POA: Diagnosis not present

## 2022-03-26 DIAGNOSIS — F0390 Unspecified dementia without behavioral disturbance: Secondary | ICD-10-CM | POA: Diagnosis not present

## 2022-03-26 DIAGNOSIS — I700 Atherosclerosis of aorta: Secondary | ICD-10-CM | POA: Diagnosis not present

## 2022-03-26 DIAGNOSIS — Z7409 Other reduced mobility: Secondary | ICD-10-CM | POA: Diagnosis not present

## 2022-03-26 DIAGNOSIS — I69319 Unspecified symptoms and signs involving cognitive functions following cerebral infarction: Secondary | ICD-10-CM | POA: Diagnosis not present

## 2022-03-26 DIAGNOSIS — J690 Pneumonitis due to inhalation of food and vomit: Secondary | ICD-10-CM | POA: Diagnosis not present

## 2022-03-26 DIAGNOSIS — I69391 Dysphagia following cerebral infarction: Secondary | ICD-10-CM | POA: Diagnosis not present

## 2022-03-26 DIAGNOSIS — E785 Hyperlipidemia, unspecified: Secondary | ICD-10-CM | POA: Diagnosis not present

## 2022-03-26 DIAGNOSIS — R69 Illness, unspecified: Secondary | ICD-10-CM | POA: Diagnosis not present

## 2022-03-26 DIAGNOSIS — Z86718 Personal history of other venous thrombosis and embolism: Secondary | ICD-10-CM | POA: Diagnosis not present

## 2022-03-26 DIAGNOSIS — G928 Other toxic encephalopathy: Secondary | ICD-10-CM | POA: Diagnosis not present

## 2022-03-26 DIAGNOSIS — G9341 Metabolic encephalopathy: Secondary | ICD-10-CM | POA: Diagnosis not present

## 2022-03-26 DIAGNOSIS — E872 Acidosis, unspecified: Secondary | ICD-10-CM | POA: Diagnosis not present

## 2022-03-26 DIAGNOSIS — R4182 Altered mental status, unspecified: Secondary | ICD-10-CM | POA: Diagnosis not present

## 2022-03-26 DIAGNOSIS — Z515 Encounter for palliative care: Secondary | ICD-10-CM | POA: Diagnosis not present

## 2022-03-26 DIAGNOSIS — I10 Essential (primary) hypertension: Secondary | ICD-10-CM | POA: Diagnosis not present

## 2022-03-26 DIAGNOSIS — E875 Hyperkalemia: Secondary | ICD-10-CM | POA: Diagnosis not present

## 2022-03-26 DIAGNOSIS — R1310 Dysphagia, unspecified: Secondary | ICD-10-CM | POA: Diagnosis not present

## 2022-03-26 DIAGNOSIS — L89892 Pressure ulcer of other site, stage 2: Secondary | ICD-10-CM | POA: Diagnosis not present

## 2022-03-26 DIAGNOSIS — I639 Cerebral infarction, unspecified: Secondary | ICD-10-CM | POA: Diagnosis not present

## 2022-03-26 DIAGNOSIS — D649 Anemia, unspecified: Secondary | ICD-10-CM | POA: Diagnosis not present

## 2022-03-26 DIAGNOSIS — D509 Iron deficiency anemia, unspecified: Secondary | ICD-10-CM | POA: Diagnosis not present

## 2022-03-26 DIAGNOSIS — E46 Unspecified protein-calorie malnutrition: Secondary | ICD-10-CM | POA: Diagnosis not present

## 2022-03-26 DIAGNOSIS — R278 Other lack of coordination: Secondary | ICD-10-CM | POA: Diagnosis not present

## 2022-03-26 DIAGNOSIS — Z7189 Other specified counseling: Secondary | ICD-10-CM | POA: Diagnosis not present

## 2022-03-26 DIAGNOSIS — Z87891 Personal history of nicotine dependence: Secondary | ICD-10-CM | POA: Diagnosis not present

## 2022-03-26 DIAGNOSIS — R471 Dysarthria and anarthria: Secondary | ICD-10-CM | POA: Diagnosis not present

## 2022-03-26 DIAGNOSIS — Z993 Dependence on wheelchair: Secondary | ICD-10-CM | POA: Diagnosis not present

## 2022-03-26 DIAGNOSIS — N390 Urinary tract infection, site not specified: Secondary | ICD-10-CM | POA: Diagnosis not present

## 2022-03-26 DIAGNOSIS — R29810 Facial weakness: Secondary | ICD-10-CM | POA: Diagnosis not present

## 2022-03-26 DIAGNOSIS — R4701 Aphasia: Secondary | ICD-10-CM | POA: Diagnosis not present

## 2022-03-26 DIAGNOSIS — Z66 Do not resuscitate: Secondary | ICD-10-CM | POA: Diagnosis not present

## 2022-03-26 DIAGNOSIS — I6389 Other cerebral infarction: Secondary | ICD-10-CM | POA: Diagnosis not present

## 2022-03-26 DIAGNOSIS — R531 Weakness: Secondary | ICD-10-CM | POA: Diagnosis not present

## 2022-03-26 DIAGNOSIS — Z7901 Long term (current) use of anticoagulants: Secondary | ICD-10-CM | POA: Diagnosis not present

## 2022-03-26 DIAGNOSIS — G9349 Other encephalopathy: Secondary | ICD-10-CM | POA: Diagnosis not present

## 2022-03-26 DIAGNOSIS — I69351 Hemiplegia and hemiparesis following cerebral infarction affecting right dominant side: Secondary | ICD-10-CM | POA: Diagnosis not present

## 2022-03-26 DIAGNOSIS — G8194 Hemiplegia, unspecified affecting left nondominant side: Secondary | ICD-10-CM | POA: Diagnosis not present

## 2022-03-26 DIAGNOSIS — E559 Vitamin D deficiency, unspecified: Secondary | ICD-10-CM | POA: Diagnosis not present

## 2022-03-26 DIAGNOSIS — E876 Hypokalemia: Secondary | ICD-10-CM | POA: Diagnosis not present

## 2022-03-26 DIAGNOSIS — K449 Diaphragmatic hernia without obstruction or gangrene: Secondary | ICD-10-CM | POA: Diagnosis not present

## 2022-03-26 DIAGNOSIS — I82891 Chronic embolism and thrombosis of other specified veins: Secondary | ICD-10-CM | POA: Diagnosis not present

## 2022-03-26 DIAGNOSIS — E43 Unspecified severe protein-calorie malnutrition: Secondary | ICD-10-CM | POA: Diagnosis present

## 2022-03-26 DIAGNOSIS — E8809 Other disorders of plasma-protein metabolism, not elsewhere classified: Secondary | ICD-10-CM | POA: Diagnosis not present

## 2022-03-26 DIAGNOSIS — R404 Transient alteration of awareness: Secondary | ICD-10-CM | POA: Diagnosis not present

## 2022-03-26 DIAGNOSIS — R55 Syncope and collapse: Secondary | ICD-10-CM | POA: Diagnosis not present

## 2022-03-26 DIAGNOSIS — M6259 Muscle wasting and atrophy, not elsewhere classified, multiple sites: Secondary | ICD-10-CM | POA: Diagnosis not present

## 2022-03-26 DIAGNOSIS — R29717 NIHSS score 17: Secondary | ICD-10-CM | POA: Diagnosis not present

## 2022-03-26 DIAGNOSIS — I6782 Cerebral ischemia: Secondary | ICD-10-CM | POA: Diagnosis not present

## 2022-03-26 DIAGNOSIS — Z7401 Bed confinement status: Secondary | ICD-10-CM | POA: Diagnosis not present

## 2022-03-26 DIAGNOSIS — M6281 Muscle weakness (generalized): Secondary | ICD-10-CM | POA: Diagnosis not present

## 2022-03-26 DIAGNOSIS — L89153 Pressure ulcer of sacral region, stage 3: Secondary | ICD-10-CM | POA: Diagnosis not present

## 2022-03-26 DIAGNOSIS — K219 Gastro-esophageal reflux disease without esophagitis: Secondary | ICD-10-CM | POA: Diagnosis not present

## 2022-03-27 DIAGNOSIS — I639 Cerebral infarction, unspecified: Secondary | ICD-10-CM | POA: Diagnosis not present

## 2022-03-27 DIAGNOSIS — D649 Anemia, unspecified: Secondary | ICD-10-CM | POA: Diagnosis not present

## 2022-03-27 DIAGNOSIS — K523 Indeterminate colitis: Secondary | ICD-10-CM | POA: Diagnosis not present

## 2022-03-27 DIAGNOSIS — N390 Urinary tract infection, site not specified: Secondary | ICD-10-CM | POA: Diagnosis not present

## 2022-03-28 ENCOUNTER — Institutional Professional Consult (permissible substitution): Payer: Medicare Other | Admitting: Neurology

## 2022-03-28 DIAGNOSIS — E559 Vitamin D deficiency, unspecified: Secondary | ICD-10-CM | POA: Diagnosis not present

## 2022-03-28 DIAGNOSIS — E785 Hyperlipidemia, unspecified: Secondary | ICD-10-CM | POA: Diagnosis not present

## 2022-03-28 DIAGNOSIS — D649 Anemia, unspecified: Secondary | ICD-10-CM | POA: Diagnosis not present

## 2022-03-28 DIAGNOSIS — I639 Cerebral infarction, unspecified: Secondary | ICD-10-CM | POA: Diagnosis not present

## 2022-03-30 DIAGNOSIS — I639 Cerebral infarction, unspecified: Secondary | ICD-10-CM | POA: Diagnosis not present

## 2022-03-30 DIAGNOSIS — D649 Anemia, unspecified: Secondary | ICD-10-CM | POA: Diagnosis not present

## 2022-04-11 ENCOUNTER — Emergency Department (HOSPITAL_COMMUNITY): Payer: Medicare Other

## 2022-04-11 ENCOUNTER — Inpatient Hospital Stay (HOSPITAL_COMMUNITY)
Admission: EM | Admit: 2022-04-11 | Discharge: 2022-04-21 | DRG: 064 | Disposition: A | Discharge status: Hospice | Payer: Medicare Other | Attending: Internal Medicine | Admitting: Internal Medicine

## 2022-04-11 ENCOUNTER — Other Ambulatory Visit: Payer: Self-pay

## 2022-04-11 DIAGNOSIS — F0390 Unspecified dementia without behavioral disturbance: Secondary | ICD-10-CM | POA: Diagnosis present

## 2022-04-11 DIAGNOSIS — G928 Other toxic encephalopathy: Secondary | ICD-10-CM

## 2022-04-11 DIAGNOSIS — R059 Cough, unspecified: Secondary | ICD-10-CM | POA: Diagnosis not present

## 2022-04-11 DIAGNOSIS — I6389 Other cerebral infarction: Secondary | ICD-10-CM | POA: Diagnosis not present

## 2022-04-11 DIAGNOSIS — I63512 Cerebral infarction due to unspecified occlusion or stenosis of left middle cerebral artery: Principal | ICD-10-CM | POA: Diagnosis present

## 2022-04-11 DIAGNOSIS — Z8249 Family history of ischemic heart disease and other diseases of the circulatory system: Secondary | ICD-10-CM

## 2022-04-11 DIAGNOSIS — Z882 Allergy status to sulfonamides status: Secondary | ICD-10-CM

## 2022-04-11 DIAGNOSIS — R471 Dysarthria and anarthria: Secondary | ICD-10-CM | POA: Diagnosis present

## 2022-04-11 DIAGNOSIS — E43 Unspecified severe protein-calorie malnutrition: Secondary | ICD-10-CM | POA: Diagnosis present

## 2022-04-11 DIAGNOSIS — K449 Diaphragmatic hernia without obstruction or gangrene: Secondary | ICD-10-CM | POA: Diagnosis not present

## 2022-04-11 DIAGNOSIS — R404 Transient alteration of awareness: Secondary | ICD-10-CM | POA: Diagnosis not present

## 2022-04-11 DIAGNOSIS — Z86718 Personal history of other venous thrombosis and embolism: Secondary | ICD-10-CM

## 2022-04-11 DIAGNOSIS — Z66 Do not resuscitate: Secondary | ICD-10-CM | POA: Diagnosis present

## 2022-04-11 DIAGNOSIS — K219 Gastro-esophageal reflux disease without esophagitis: Secondary | ICD-10-CM | POA: Diagnosis present

## 2022-04-11 DIAGNOSIS — E785 Hyperlipidemia, unspecified: Secondary | ICD-10-CM | POA: Diagnosis not present

## 2022-04-11 DIAGNOSIS — G8194 Hemiplegia, unspecified affecting left nondominant side: Secondary | ICD-10-CM | POA: Diagnosis present

## 2022-04-11 DIAGNOSIS — R296 Repeated falls: Secondary | ICD-10-CM | POA: Diagnosis not present

## 2022-04-11 DIAGNOSIS — R29717 NIHSS score 17: Secondary | ICD-10-CM | POA: Diagnosis present

## 2022-04-11 DIAGNOSIS — E876 Hypokalemia: Secondary | ICD-10-CM | POA: Diagnosis present

## 2022-04-11 DIAGNOSIS — E8809 Other disorders of plasma-protein metabolism, not elsewhere classified: Secondary | ICD-10-CM | POA: Diagnosis present

## 2022-04-11 DIAGNOSIS — E872 Acidosis, unspecified: Secondary | ICD-10-CM | POA: Diagnosis not present

## 2022-04-11 DIAGNOSIS — R4182 Altered mental status, unspecified: Secondary | ICD-10-CM | POA: Diagnosis not present

## 2022-04-11 DIAGNOSIS — G9349 Other encephalopathy: Secondary | ICD-10-CM | POA: Diagnosis present

## 2022-04-11 DIAGNOSIS — I10 Essential (primary) hypertension: Secondary | ICD-10-CM | POA: Diagnosis present

## 2022-04-11 DIAGNOSIS — Z87891 Personal history of nicotine dependence: Secondary | ICD-10-CM

## 2022-04-11 DIAGNOSIS — Z888 Allergy status to other drugs, medicaments and biological substances status: Secondary | ICD-10-CM

## 2022-04-11 DIAGNOSIS — I639 Cerebral infarction, unspecified: Principal | ICD-10-CM

## 2022-04-11 DIAGNOSIS — I69391 Dysphagia following cerebral infarction: Secondary | ICD-10-CM | POA: Diagnosis not present

## 2022-04-11 DIAGNOSIS — R29810 Facial weakness: Secondary | ICD-10-CM | POA: Diagnosis not present

## 2022-04-11 DIAGNOSIS — Z515 Encounter for palliative care: Secondary | ICD-10-CM

## 2022-04-11 DIAGNOSIS — E782 Mixed hyperlipidemia: Secondary | ICD-10-CM | POA: Diagnosis not present

## 2022-04-11 DIAGNOSIS — I82409 Acute embolism and thrombosis of unspecified deep veins of unspecified lower extremity: Secondary | ICD-10-CM | POA: Diagnosis present

## 2022-04-11 DIAGNOSIS — D509 Iron deficiency anemia, unspecified: Secondary | ICD-10-CM | POA: Diagnosis present

## 2022-04-11 DIAGNOSIS — R4701 Aphasia: Secondary | ICD-10-CM | POA: Diagnosis present

## 2022-04-11 DIAGNOSIS — Z7189 Other specified counseling: Secondary | ICD-10-CM | POA: Diagnosis not present

## 2022-04-11 DIAGNOSIS — E875 Hyperkalemia: Secondary | ICD-10-CM | POA: Diagnosis present

## 2022-04-11 DIAGNOSIS — R69 Illness, unspecified: Secondary | ICD-10-CM

## 2022-04-11 DIAGNOSIS — Z993 Dependence on wheelchair: Secondary | ICD-10-CM | POA: Diagnosis not present

## 2022-04-11 DIAGNOSIS — R1310 Dysphagia, unspecified: Secondary | ICD-10-CM | POA: Diagnosis not present

## 2022-04-11 DIAGNOSIS — J690 Pneumonitis due to inhalation of food and vomit: Secondary | ICD-10-CM | POA: Diagnosis present

## 2022-04-11 DIAGNOSIS — I69351 Hemiplegia and hemiparesis following cerebral infarction affecting right dominant side: Secondary | ICD-10-CM

## 2022-04-11 DIAGNOSIS — L89153 Pressure ulcer of sacral region, stage 3: Secondary | ICD-10-CM | POA: Diagnosis present

## 2022-04-11 DIAGNOSIS — R627 Adult failure to thrive: Secondary | ICD-10-CM | POA: Diagnosis not present

## 2022-04-11 DIAGNOSIS — R5381 Other malaise: Secondary | ICD-10-CM | POA: Diagnosis present

## 2022-04-11 DIAGNOSIS — Z823 Family history of stroke: Secondary | ICD-10-CM

## 2022-04-11 DIAGNOSIS — Z79890 Hormone replacement therapy (postmenopausal): Secondary | ICD-10-CM

## 2022-04-11 DIAGNOSIS — Z881 Allergy status to other antibiotic agents status: Secondary | ICD-10-CM

## 2022-04-11 DIAGNOSIS — Z79899 Other long term (current) drug therapy: Secondary | ICD-10-CM

## 2022-04-11 DIAGNOSIS — Z7901 Long term (current) use of anticoagulants: Secondary | ICD-10-CM

## 2022-04-11 DIAGNOSIS — N1831 Chronic kidney disease, stage 3a: Secondary | ICD-10-CM | POA: Diagnosis present

## 2022-04-11 DIAGNOSIS — R531 Weakness: Secondary | ICD-10-CM | POA: Diagnosis not present

## 2022-04-11 DIAGNOSIS — E46 Unspecified protein-calorie malnutrition: Secondary | ICD-10-CM

## 2022-04-11 DIAGNOSIS — I6782 Cerebral ischemia: Secondary | ICD-10-CM | POA: Diagnosis not present

## 2022-04-11 LAB — CBC
HCT: 35.6 % — ABNORMAL LOW (ref 36.0–46.0)
Hemoglobin: 10.4 g/dL — ABNORMAL LOW (ref 12.0–15.0)
MCH: 26.3 pg (ref 26.0–34.0)
MCHC: 29.2 g/dL — ABNORMAL LOW (ref 30.0–36.0)
MCV: 90.1 fL (ref 80.0–100.0)
Platelets: 199 10*3/uL (ref 150–400)
RBC: 3.95 MIL/uL (ref 3.87–5.11)
RDW: 18 % — ABNORMAL HIGH (ref 11.5–15.5)
WBC: 8.3 10*3/uL (ref 4.0–10.5)
nRBC: 0 % (ref 0.0–0.2)

## 2022-04-11 LAB — COMPREHENSIVE METABOLIC PANEL
ALT: 16 U/L (ref 0–44)
AST: 29 U/L (ref 15–41)
Albumin: 2.5 g/dL — ABNORMAL LOW (ref 3.5–5.0)
Alkaline Phosphatase: 66 U/L (ref 38–126)
Anion gap: 6 (ref 5–15)
BUN: 11 mg/dL (ref 8–23)
CO2: 24 mmol/L (ref 22–32)
Calcium: 9.2 mg/dL (ref 8.9–10.3)
Chloride: 108 mmol/L (ref 98–111)
Creatinine, Ser: 0.91 mg/dL (ref 0.44–1.00)
GFR, Estimated: >60 mL/min (ref >60)
Glucose, Bld: 99 mg/dL (ref 70–99)
Potassium: 4.6 mmol/L (ref 3.5–5.1)
Sodium: 138 mmol/L (ref 135–145)
Total Bilirubin: 0.5 mg/dL (ref 0.3–1.2)
Total Protein: 6.4 g/dL — ABNORMAL LOW (ref 6.5–8.1)

## 2022-04-11 LAB — ETHANOL: Alcohol, Ethyl (B): <10 mg/dL (ref <10)

## 2022-04-11 LAB — I-STAT CHEM 8, ED
BUN: 14 mg/dL (ref 8–23)
BUN: 11 mg/dL (ref 8–23)
Calcium, Ion: 1.24 mmol/L (ref 1.15–1.40)
Calcium, Ion: 1.27 mmol/L (ref 1.15–1.40)
Chloride: 107 mmol/L (ref 98–111)
Chloride: 105 mmol/L (ref 98–111)
Creatinine, Ser: 0.9 mg/dL (ref 0.44–1.00)
Creatinine, Ser: 0.8 mg/dL (ref 0.44–1.00)
Glucose, Bld: 97 mg/dL (ref 70–99)
Glucose, Bld: 92 mg/dL (ref 70–99)
HCT: 34 % — ABNORMAL LOW (ref 36.0–46.0)
HCT: 31 % — ABNORMAL LOW (ref 36.0–46.0)
Hemoglobin: 11.6 g/dL — ABNORMAL LOW (ref 12.0–15.0)
Hemoglobin: 10.5 g/dL — ABNORMAL LOW (ref 12.0–15.0)
Potassium: 4.5 mmol/L (ref 3.5–5.1)
Potassium: 3.8 mmol/L (ref 3.5–5.1)
Sodium: 139 mmol/L (ref 135–145)
Sodium: 139 mmol/L (ref 135–145)
TCO2: 23 mmol/L (ref 22–32)
TCO2: 22 mmol/L (ref 22–32)

## 2022-04-11 LAB — DIFFERENTIAL
Abs Immature Granulocytes: 0.02 10*3/uL (ref 0.00–0.07)
Basophils Absolute: 0 10*3/uL (ref 0.0–0.1)
Basophils Relative: 1 %
Eosinophils Absolute: 0 10*3/uL (ref 0.0–0.5)
Eosinophils Relative: 1 %
Immature Granulocytes: 0 %
Lymphocytes Relative: 42 %
Lymphs Abs: 3.5 10*3/uL (ref 0.7–4.0)
Monocytes Absolute: 0.4 10*3/uL (ref 0.1–1.0)
Monocytes Relative: 5 %
Neutro Abs: 4.3 10*3/uL (ref 1.7–7.7)
Neutrophils Relative %: 51 %

## 2022-04-11 LAB — URINALYSIS, ROUTINE W REFLEX MICROSCOPIC
Bilirubin Urine: NEGATIVE
Glucose, UA: NEGATIVE mg/dL
Ketones, ur: NEGATIVE mg/dL
Nitrite: NEGATIVE
Protein, ur: 30 mg/dL — AB
Specific Gravity, Urine: >1.046 — ABNORMAL HIGH (ref 1.005–1.030)
pH: 7 (ref 5.0–8.0)

## 2022-04-11 LAB — AMMONIA: Ammonia: 22 umol/L (ref 9–35)

## 2022-04-11 LAB — CBG MONITORING, ED: Glucose-Capillary: 95 mg/dL (ref 70–99)

## 2022-04-11 LAB — PHOSPHORUS: Phosphorus: 2.7 mg/dL (ref 2.5–4.6)

## 2022-04-11 LAB — VITAMIN B12: Vitamin B-12: 1153 pg/mL — ABNORMAL HIGH (ref 180–914)

## 2022-04-11 LAB — APTT: aPTT: 71 seconds — ABNORMAL HIGH (ref 24–36)

## 2022-04-11 LAB — MAGNESIUM: Magnesium: 1.7 mg/dL (ref 1.7–2.4)

## 2022-04-11 LAB — PROTIME-INR
INR: 2.9 — ABNORMAL HIGH (ref 0.8–1.2)
Prothrombin Time: 30.4 seconds — ABNORMAL HIGH (ref 11.4–15.2)

## 2022-04-11 LAB — TSH: TSH: 3.244 u[IU]/mL (ref 0.350–4.500)

## 2022-04-11 MED ORDER — IOHEXOL 350 MG/ML SOLN
75.0000 mL | Freq: Once | INTRAVENOUS | Status: AC | PRN
  Administered 2022-04-11: 75 mL via INTRAVENOUS

## 2022-04-11 MED ORDER — SODIUM CHLORIDE 0.9% FLUSH
3.0000 mL | Freq: Once | INTRAVENOUS | Status: AC
  Administered 2022-04-11: 3 mL via INTRAVENOUS

## 2022-04-11 MED ORDER — LORAZEPAM 2 MG/ML IJ SOLN
1.0000 mg | Freq: Once | INTRAMUSCULAR | Status: AC
  Administered 2022-04-11: 1 mg via INTRAVENOUS
  Filled 2022-04-11: qty 1

## 2022-04-11 NOTE — Consult Note (Signed)
Neurology Consultation  Reason for Consult: Aphasia and left-sided weakness Referring Physician: Dr. Donnald Garre  CC: none  History is obtained from: EMS and chart  HPI: Roberta Mercer is a 81 y.o. female with history of CKD, DVT on Coumadin, stroke, hypertension and hyperlipidemia who presents from her skilled nursing facility today with aphasia and left-sided weakness but later may be reporting right-sided weakness.   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  She is wheelchair-bound at baseline. EMS was given last known well of 1645 hrs. but they were concerned that the time was not accurate  Symptoms were identified at 1700 when staff came to give her her dinner tray, she was noted to have difficulty speaking as well as left-sided weakness. There was no family member at bedside.  No one could be reached over the phone to confirm the last known well. Patient was unable to provide any history at this time. She is on Coumadin for a DVT/PE.  She was at 1 point in Eliquis, failed Eliquis and was put back on Coumadin.  Per the EMS verbal report, medications are given regularly to the patient at that facility with last dose of Coumadin as advised and was given within the last 24 hours.  LKW: 1645, time questionable TNK given?: no, on warfarin and questionable last known well time IR Thrombectomy? No, no new LVO and poor functional status at baseline Modified Rankin Scale: 4-Needs assistance to walk and tend to bodily needs  ROS:  Unable to obtain due to altered mental status.   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 Her smoking use included cigarettes. She has never used smokeless tobacco. She reports that she does not drink alcohol and does not use drugs.  Medications  Current Facility-Administered Medications:    LORazepam (ATIVAN) injection 1 mg, 1 mg, Intravenous, Once, [employer], Cortney E, NP   sodium chloride flush (NS) 0.9 % injection 3 mL, 3 mL, Intravenous, Once, Pfeiffer, Marcy, MD  Current Outpatient Medications:    Alpha-Lipoic Acid 300 MG TABS, Take 300 mg by mouth 2 (two) times daily., Disp: , Rfl:    amLODipine (NORVASC) 10 MG tablet, Take 10 mg by mouth daily., Disp: , Rfl:    aspirin EC 81 MG tablet, Take 81 mg by mouth 2 (two) times daily. Swallow whole., Disp: , Rfl:    Borage, Borago officinalis, (BORAGE 1000 PO), Take 1 tablet by mouth daily., Disp: , Rfl:    Calcium Carb-Cholecalciferol (CALCIUM 600 + D PO), Take  1 tablet by mouth in the morning and at bedtime., Disp: , Rfl:    cephALEXin (KEFLEX) 500 MG capsule, Take 500 mg by mouth 2 (two) times daily. UTI, Disp: , Rfl:    cholecalciferol (VITAMIN D3) 25 MCG (1000 UT) tablet, Take 1,000 Units by mouth daily., Disp: , Rfl:    COLLAGEN PO, Take 2,000 mg by mouth 2 (two) times daily., Disp: , Rfl:    cyanocobalamin 1000 MCG tablet, Take 1,000 mcg by mouth 2 (two) times daily., Disp: , Rfl:    ferrous sulfate 325 (65 FE) MG EC tablet, Take 325 mg by mouth daily., Disp: , Rfl:     folic acid (FOLVITE) 400 MCG tablet, Take 400 mcg by mouth 2 (two) times daily., Disp: , Rfl:    Magnesium 250 MG TABS, Take 250 mg by mouth 2 (two) times daily., Disp: , Rfl:    Omega-3 Fatty Acids (FISH OIL) 1000 MG CAPS, Take 1,000 mg by mouth daily. , Disp: , Rfl:    pyridOXINE (VITAMIN B-6) 100 MG tablet, Take 100 mg by mouth daily., Disp: , Rfl:    rosuvastatin (CRESTOR) 20 MG tablet, Take 20 mg by mouth daily., Disp: , Rfl:    thiamine 100 MG tablet, Take 100 mg by mouth daily., Disp: , Rfl:    topiramate (TOPAMAX) 25 MG tablet, Take 25 mg by mouth daily., Disp: , Rfl:    warfarin (COUMADIN) 2.5 MG tablet, Take 2.5 mg by mouth daily. Additional 2.5 Monday's and Fridays, Disp: , Rfl:    Exam: Current vital signs: Wt 59.8 kg   BMI 21.28 kg/m  Vital signs in last 24 hours: Weight:  [59.8 kg] 59.8 kg (12/26 1800)  GENERAL: Awake, alert, in moderate distress due to anxiety Psych: Acutely anxious Head: Normocephalic and atraumatic, without obvious abnormality EENT: Normal conjunctivae, waist mucous membranes, no OP obstruction LUNGS: Normal respiratory effort. Non-labored breathing on room air Extremities: warm, well perfused, without obvious deformity  NEURO:  Mental Status: Awake, alert, responds to name but disoriented to place time and situation, unable to follow commands She is not able to provide a clear and coherent history of present illness. Speech/Language: speech is somewhat dysarthric and in short phrases, difficult to understand, patient is unable to name objects or repeat phrases Cranial Nerves:  II: PERRL does not blink to threat on the right III, IV, VI: EOMI. Lid elevation symmetric and full.  V: Does not blink to threat on the right VII: Right facial droop VIII: Hearing intact to voice IX, X: Voice dysarthric XII: Uncooperative with tongue protrusion Motor: Antigravity strength in left upper and lower extremities, noted to be weaker on the right than the  left Tone is increased Sensation: Appears intact bilaterally, unable to assess for extinction Coordination: Unable to perform Gait: Deferred  NIHSS: 1a Level of Conscious.: 0 1b LOC Questions: 2 1c LOC Commands: 2 2 Best Gaze: 0 3 Visual: 2 4 Facial Palsy: 1 5a Motor Arm - left: 0 5b Motor Arm - Right: 2 6a Motor Leg - Left: 2 6b Motor Leg - Right: 2 7 Limb Ataxia: 0 8 Sensory: 0 9 Best Language: 2 10 Dysarthria: 2 11 Extinct. and Inatten.: 0 TOTAL: 17   Labs I have reviewed labs in epic and the results pertinent to this consultation are:  CBC    Component Value Date/Time   WBC 13.8 (H) 03/22/2022 1000   RBC 3.85 (L) 03/22/2022 1000   HGB 11.6 (L) 04/11/2022 1809  HCT 34.0 (L) 04/11/2022 1809   PLT 329.0 03/22/2022 1000   MCV 85.4 03/22/2022 1000   MCHC 30.8 03/22/2022 1000   RDW 20.2 (H) 03/22/2022 1000   LYMPHSABS 5.7 (H) 03/22/2022 1000   MONOABS 0.7 03/22/2022 1000   EOSABS 0.1 03/22/2022 1000   BASOSABS 0.1 03/22/2022 1000    CMP     Component Value Date/Time   NA 139 04/11/2022 1809   NA 140 05/26/2019 1540   K 4.5 04/11/2022 1809   CL 107 04/11/2022 1809   CO2 25 03/22/2022 1000   GLUCOSE 97 04/11/2022 1809   BUN 14 04/11/2022 1809   BUN 32 (H) 05/26/2019 1540   CREATININE 0.90 04/11/2022 1809   CALCIUM 11.1 (H) 03/22/2022 1000   PROT 7.7 03/22/2022 1000   ALBUMIN 3.1 (L) 03/22/2022 1000   AST 15 03/22/2022 1000   ALT 12 03/22/2022 1000   ALKPHOS 77 03/22/2022 1000   BILITOT 0.3 03/22/2022 1000   GFRNONAA 46 (L) 05/26/2019 1540   GFRAA 53 (L) 05/26/2019 1540    Lipid Panel     Component Value Date/Time   CHOL 143 01/20/2020 1049   TRIG 97 01/20/2020 1049   HDL 61 01/20/2020 1049   CHOLHDL 2.3 01/20/2020 1049   LDLCALC 64 01/20/2020 1049     Imaging I have reviewed the images obtained:  CT-scan of the brain: Severe chronic microvascular ischemic disease, multiple chronic infarcts, possible region of poor gray-white  differentiation in left insular cortex  CTA head and neck: New focal occlusion or high-grade stenosis of left M2 segments  MRI examination of the brain pending  Assessment: 81 year old patient with history of stroke with residual right-sided weakness, hypertension, hyperlipidemia, DVT on Coumadin and CKD presented from skilled nursing facility with aphasia and initial report of left-sided weakness, and later possibly change to right-sided weakness.  On neurology team exam right-sided weakness is noted to be present, but this is also reported residual from her prior stroke.   She is able to speak in short phrases and is somewhat dysarthric and cannot name objects or repeat phrases.  She is unable to follow simple commands consistently.   CT head shows no hemorrhage.  Last known well time is questionable, as well as she is on Coumadin, so TNK was not administered.  Later on, INR came in at 1.4 but last known well still remains unclear. CTA shows possible new focal occlusion of left M2 segment, but patient is not candidate for IR due to poor functional baseline.   Likely left hemispheric stroke but symptoms could also be due to toxic metabolic encephalopathy causing stroke recrudescence given verbal report of existing right-sided deficits from prior stroke.  Impression: Acute ischemic stroke versus toxic metabolic encephalopathy  Recommendations: -Please obtain MRI brain, proceed with stroke workup if positive -Routine EEG -Workup for causes of toxic metabolic encephalopathy per primary team-include UA, chest x-ray, B12, TSH, ammonia. Plan was discussed with Dr. Clarice PolePfeifer.  Neurology will follow with you.  Pt seen by NP/Neuro and later by MD. Note/plan to be edited by MD as needed.  Cortney E Ernestina Columbiae La Torre , MSN, AGACNP-BC [facility] See Amion for schedule and pager information 04/11/2022 6:29 PM    Attending Neurohospitalist Addendum Patient seen and examined with  APP/Resident. Agree with the history and physical as documented above. Agree with the plan as documented, which I helped formulate. I have independently reviewed the chart, obtained history, review of systems and examined the patient.I have personally reviewed  pertinent head/neck/spine imaging (CT/MRI). Please feel free to call with any questions.  -- Milon Dikes, MD Neurologist [facility] Pager: [phone]

## 2022-04-11 NOTE — ED Notes (Signed)
EDP notified that patient can not tolerate MRI despite receiving Ativan 1 mg prior to procedure .

## 2022-04-11 NOTE — Code Documentation (Signed)
Stroke Response Nurse Documentation Code Documentation  Roberta Mercer is a 81 y.o. female arriving to [facility]  via [facility] on 04-11-2022 with past medical hx of CKD, DVT, CVA, HTN. On warfarin daily. Code stroke was activated by EMS.   Patient from skilled facility where she was LKW at 1645 and now complaining of aphasia and left side weakness  Stroke team at the bedside on patient arrival. Labs drawn and patient cleared for CT by EDP. Patient to CT with team. NIHSS 17, see documentation for details and code stroke times. Patient with disoriented, not following commands, right hemianopia, right facial droop, right arm weakness, bilateral leg weakness, Global aphasia , and dysarthria  on exam. The following imaging was completed:  CT Head and CTA. Patient is not a candidate for IV Thrombolytic due to taking Coumadin. Patient is not a candidate for IR due to no LVO on CTA.   Care Plan: neuro check and VS q 2 hours.   Bedside handoff with ED RN [place].    Marcellina Millin  Stroke Response RN

## 2022-04-11 NOTE — ED Provider Notes (Signed)
Blood pressure (!) 145/71, pulse 73, temperature 99.1 F (37.3 C), resp. rate 19, weight 59.8 kg, SpO2 99 %.  Assuming care from Dr. Donnald Garre.  In short, Roberta Mercer is a 81 y.o. female with a chief complaint of Code Stroke .  Refer to the original H&P for additional details.  The current plan of care is to speak with [facility] regarding admit.  11:48 PM  Discussed patient's case with [facility] to request admission. Patient and family (if present) updated with plan.   I reviewed all nursing notes, vitals, pertinent old records, EKGs, labs, imaging (as available).     Maia Plan, MD 04/11/22 2348

## 2022-04-11 NOTE — H&P (Signed)
History and Physical    Patient: Roberta Mercer ZDG:[id number] DOB: [birth year] DOA: 04/11/2022 DOS: the patient was seen and examined on 04/12/2022 PCP: Lise Auer, MD  Patient coming from: SNF  Chief Complaint:  Chief Complaint  Patient presents with   Code Stroke   HPI: Roberta Mercer is a 81 y.o. female with medical history significant of DVT on Coumadin, hypertension, hyperlipidemia, stroke with right-sided residual weakness, GERD who presents to the emergency department via EMS from skilled nursing facility due to left-sided weakness.  Patient was unable to provide history, history was obtained from ED physician and ED medical record.  Per report, Last known well given to EMS was 1645 hours, though the EMS team was concerned of the inappropriateness of the timing.  Nursing staff came to give denied to patient around 1700 and that was when she was noted to have left-sided weakness and difficulty speaking.  No family member was at bedside. Patient was at a time on Eliquis, but she failed the Eliquis and was placed back on Coumadin.  Per medical record, patient was compliant with her medication including Coumadin which was provided by the nursing facility.  Patient is wheelchair-bound at baseline.  ED Course:  In the emergency department, temperature on arrival was 97.1 F, BP was 160/98, but other vital signs were within normal range.  Workup in the ED showed normocytic anemia, BMP was normal, albumin 2.5, INR 2.9, urinalysis was positive for large leukocytes and rare bacteria.  Magnesium 1.7, phosphorus 2.7. CT angiography of head and neck showed: 1. New focal occlusion or high-grade stenosis of left M2 segments. 2. Possible regions of loss of gray white differentiation in the left inulsar cortex, which could represent sites of acute infarct. 3. Unchanged moderate narrowing of the M1 segment of the right MCA. 4. 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 Chronic infarcts in  the bilateral caudate heads, left lentiform nucleus, bilateral cerebellar hemispheres, and in the corona radiata on the left. Chest x-ray showed no active disease Patient was treated with IV Ativan 1 mg x 1.  Neurologist was consulted and recommended further stroke workup per  EDP.  Hospitalist was asked to admit patient for further evaluation and management.  Review of Systems: Review of systems as noted in the HPI. All other systems reviewed and are negative.   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 Her smoking use included cigarettes. She has never used smokeless tobacco. She reports that she does not drink alcohol and does not use drugs.   Allergies  Allergen Reactions   Bactrim [Sulfamethoxazole-Trimethoprim] Other (See Comments)    Unknown reaction    Diovan [Valsartan] Other (See Comments)    Unknown reaction   Sulfa Antibiotics Other (See Comments)    Listed on MAR Unknown reaction    Family History  Problem Relation Age of Onset   Heart disease Mother    Stroke Mother    Vascular  Disease Maternal Grandmother    Stroke Maternal Grandmother      Prior to Admission medications   Medication Sig Start Date End Date Taking? Authorizing Provider  acetaminophen (TYLENOL) 325 MG tablet Take 650 mg by mouth every 6 (six) hours as needed (pain).   Yes [provider]  amLODipine (NORVASC) 10 MG tablet Take 10 mg by mouth in the morning. (0900)   Yes [provider]  bisacodyl (DULCOLAX) 10 MG suppository Place 10 mg rectally daily as needed (no BM in 24 hours).   Yes [provider]  Cholecalciferol (VITAMIN D-3 PO) Take 1 tablet by mouth in the morning. (0800) Vitamin D3, unknown strength (per MAR)   Yes [provider]  Emollient (COLLAGEN EX) Apply 1 Application topically See admin instructions. Clean sacral wound with wound cleanser, pat dry, apply collagen particles to wound bed cover with bordered gauze every day shift.   Yes [provider]  folic acid (FOLVITE) 400 MCG tablet Take 400 mcg by mouth 2 (two) times daily. (0800, 2000)   Yes [provider]  lisinopril (ZESTRIL) 5 MG tablet Take 5 mg by mouth in the morning. (0800)   Yes [provider]  Magnesium 400 MG TABS Take 400 mg by mouth in the morning. (0900)   Yes [provider]  melatonin 5 MG TABS Take 5 mg by mouth at bedtime. (2000)   Yes [provider]  Nutritional Supplements (NUTRITIONAL DRINK) LIQD Take 120 mLs by mouth 2 (two) times daily. (0800, 2000)   Yes [provider]  Nutritional Supplements (PROSOURCE) LIQD Take 15 mLs by mouth 2 (two) times daily. (0800, 1700)   Yes [provider]  Omega-3 Fatty Acids (OMEGA-3 FISH OIL PO) Take 1 capsule by mouth in the morning. (0800)   Yes [provider]  pantoprazole (PROTONIX) 40 MG tablet Take 40 mg by mouth 2 (two) times daily. (0800, 2000)   Yes [provider]  polyethylene glycol powder (GLYCOLAX/MIRALAX) 17 GM/SCOOP powder Take 17 g by  mouth in the morning. (0800)   Yes [provider]  rosuvastatin (CRESTOR) 20 MG tablet Take 20 mg by mouth at bedtime. (2000)   Yes [provider]  saccharomyces boulardii (FLORASTOR) 250 MG capsule Take 250 mg by mouth 2 (two) times daily. (0800, 2000)   Yes [provider]  senna (SENOKOT) 8.6 MG TABS tablet Take 8.6 mg by mouth in the morning. (0800)   Yes [provider]  thiamine (VITAMIN B-1) 100 MG tablet Take 100 mg by mouth in the morning. (0800)   Yes [provider]  topiramate (TOPAMAX) 25 MG tablet Take 25 mg by mouth in the morning. (0800)   Yes [provider]  traMADol (ULTRAM) 50 MG tablet Take 50 mg by mouth every 12 (twelve) hours as needed (pain).   Yes [provider]  vitamin B-12 (CYANOCOBALAMIN) 500 MCG tablet Take 500 mcg by mouth in the morning. (0800)   Yes [provider]  warfarin (COUMADIN) 3 MG tablet Take 3 mg by mouth every evening. (2000)  Yes [provider]  amoxicillin (AMOXIL) 500 MG tablet Take 500 mg by mouth 2 (two) times daily. (03/26/22-03/30/22) Patient not taking: Reported on 04/11/2022    [provider]    Physical Exam: BP 132/67   Pulse 83   Temp 99.1 F (37.3 C)   Resp 15   Wt 59.8 kg   SpO2 98%   BMI 21.28 kg/m   General: 81 y.o. year-old female well developed well nourished in no acute distress.  Alert and oriented x3. HEENT: NCAT, EOMI Neck: Supple, trachea medial Cardiovascular: Regular rate and rhythm with no rubs or gallops.  No thyromegaly or JVD noted.  No lower extremity edema. 2/4 pulses in all 4 extremities. Respiratory: Clear to auscultation with no wheezes or rales. Good inspiratory effort. Abdomen: Soft, nontender nondistended with normal bowel sounds x4 quadrants. Muskuloskeletal: No cyanosis, clubbing or edema noted bilaterally Neuro: CN II-XII intact, strength 5/5 x 4, sensation, reflexes intact Skin: No ulcerative lesions  noted or rashes Psychiatry: Judgement and insight appear normal. Mood is appropriate for condition and setting          Labs on Admission:  Basic Metabolic Panel: Recent Labs  Lab 04/11/22 1758 04/11/22 1809 04/11/22 2030 04/11/22 2034  NA 138 139  --  139  K 4.6 4.5  --  3.8  CL 108 107  --  105  CO2 24  --   --   --   GLUCOSE 99 97  --  92  BUN 11 14  --  11  CREATININE 0.91 0.90  --  0.80  CALCIUM 9.2  --   --   --   MG  --   --  1.7  --   PHOS  --   --  2.7  --    Liver Function Tests: Recent Labs  Lab 04/11/22 1758  AST 29  ALT 16  ALKPHOS 66  BILITOT 0.5  PROT 6.4*  ALBUMIN 2.5*   No results for input(s): "LIPASE", "AMYLASE" in the last 168 hours. Recent Labs  Lab 04/11/22 2020  AMMONIA 22   CBC: Recent Labs  Lab 04/11/22 1758 04/11/22 1809 04/11/22 2034  WBC 8.3  --   --   NEUTROABS 4.3  --   --   HGB 10.4* 11.6* 10.5*  HCT 35.6* 34.0* 31.0*  MCV 90.1  --   --   PLT 199  --   --    Cardiac Enzymes: No results for input(s): "CKTOTAL", "CKMB", "CKMBINDEX", "TROPONINI" in the last 168 hours.  BNP (last 3 results) No results for input(s): "BNP" in the last 8760 hours.  ProBNP (last 3 results) No results for input(s): "PROBNP" in the last 8760 hours.  CBG: Recent Labs  Lab 04/11/22 1801  GLUCAP 95    Radiological Exams on Admission: DG Chest Port 1 View  Result Date: 04/11/2022 CLINICAL DATA:  Altered mental status EXAM: PORTABLE CHEST 1 VIEW COMPARISON:  03/22/2022 FINDINGS: Lungs are symmetrically well expanded. 12 mm rim calcified density within the right mid lung zone appears new from prior examination and may be artifactual in nature, related to a confluence of osseous and vascular shadows, given its relatively rapid development. No pneumothorax or pleural effusion. Cardiac size within normal limits. Retrocardiac density again noted likely reflecting the previously identified hiatal hernia. Pulmonary vascularity is normal. No acute bone  abnormality. IMPRESSION: 1. No active disease. 2. Moderate hiatal hernia. 3. 12 mm rim calcified density within the right mid lung zone, favored  to be artifactual. This could be confirmed with a standard two view chest radiograph once the patient's acute issues have resolved. Electronically Signed   By: Helyn Numbers M.D.   On: 04/11/2022 20:54   CT HEAD CODE STROKE WO CONTRAST  Result Date: 04/11/2022 CLINICAL DATA:  Code stroke.  Left sided weakness. EXAM: CT ANGIOGRAPHY HEAD AND NECK TECHNIQUE: Multidetector CT imaging of the head and neck was performed using the standard protocol during bolus administration of intravenous contrast. Multiplanar CT image reconstructions and MIPs were obtained to evaluate the vascular anatomy. Carotid stenosis measurements (when applicable) are obtained utilizing NASCET criteria, using the distal internal carotid diameter as the denominator. RADIATION DOSE REDUCTION: This exam was performed according to the departmental dose-optimization program which includes automated exposure control, adjustment of the mA and/or kV according to patient size and/or use of iterative reconstruction technique. CONTRAST:  75mL OMNIPAQUE IOHEXOL 350 MG/ML SOLN COMPARISON:  CT Head 03/22/22, CTA head/neck 01/09/22 FINDINGS: CT HEAD FINDINGS Brain: Possible new region of poor gray white differentiation in the left insular cortex (series 3, image 18). 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 There are chronic infarcts in the bilateral caudate heads, left lentiform nucleus, bilateral cerebellar hemispheres, in the corona radiata on the left. Compared to prior exam there appears to be new age indeterminate infarct in the left thalamus. No hemorrhage. No hydrocephalus. No extra-axial fluid collection. No hyperdense vessel. Vascular: No hyperdense vessel or unexpected calcification. Skull: Normal. Negative for fracture or focal lesion. Sinuses/Orbits: Bilateral lens replacement.  Paranasal sinuses and mastoid air cells are clear. Other: None CTA NECK FINDINGS Aortic arch: Standard branching. Imaged portion shows no evidence of aneurysm or dissection. No significant stenosis of the major arch vessel origins. Right carotid system: Calcified atherosclerotic plaque at the carotid bifurcation. Mild narrowing of the carotid bifurcation. Left carotid system: Calcified atherosclerotic plaque at the bifurcation. Mild narrowing of the carotid bifurcation. Vertebral arteries: Right dominant system. Left vertebral artery predominantly terminates in a PICA Skeleton: Negative. Other neck: Negative. Upper chest: Moderate emphysema. Review of the MIP images confirms the above findings CTA HEAD FINDINGS Anterior circulation: There is a new focal occlusion 2 high-grade stenosis of a left M2 segment (series 8, image 96). There is persistent moderate narrowing of the M1 segment of the right MCA without evidence of occlusion. There is mild narrowing of the A2 segment of the right ACA. Posterior circulation: No significant stenosis, proximal occlusion, aneurysm, or vascular malformation. Venous sinuses: As permitted by contrast timing, patent. Anatomic variants: None Review of the MIP images confirms the above findings IMPRESSION: 1. New focal occlusion or high-grade stenosis of left M2 segments. 2. Possible regions of loss of gray white differentiation in the left inulsar cortex, which could represent sites of acute infarct. 3. Unchanged moderate narrowing of the M1 segment of the right MCA. 4. 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 Chronic infarcts in the bilateral caudate heads, left lentiform nucleus, bilateral cerebellar hemispheres, and in the corona radiata on the left. Findings were paged to Dr. Wilford Corner on 04/11/22 at 6:34 PM via [facility] paging system. Electronically Signed   By: Lorenza Cambridge M.D.   On: 04/11/2022 18:35   CT ANGIO HEAD NECK W WO CM (CODE STROKE)  Result Date:  04/11/2022 CLINICAL DATA:  Code stroke.  Left sided weakness. EXAM: CT ANGIOGRAPHY HEAD AND NECK TECHNIQUE: Multidetector CT imaging of the head and neck was performed using the standard protocol during bolus administration of intravenous contrast. Multiplanar CT  image reconstructions and MIPs were obtained to evaluate the vascular anatomy. Carotid stenosis measurements (when applicable) are obtained utilizing NASCET criteria, using the distal internal carotid diameter as the denominator. RADIATION DOSE REDUCTION: This exam was performed according to the departmental dose-optimization program which includes automated exposure control, adjustment of the mA and/or kV according to patient size and/or use of iterative reconstruction technique. CONTRAST:  53mL OMNIPAQUE IOHEXOL 350 MG/ML SOLN COMPARISON:  CT Head 03/22/22, CTA head/neck 01/09/22 FINDINGS: CT HEAD FINDINGS Brain: Possible new region of poor gray white differentiation in the left insular cortex (series 3, image 18). 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 There are chronic infarcts in the bilateral caudate heads, left lentiform nucleus, bilateral cerebellar hemispheres, in the corona radiata on the left. Compared to prior exam there appears to be new age indeterminate infarct in the left thalamus. No hemorrhage. No hydrocephalus. No extra-axial fluid collection. No hyperdense vessel. Vascular: No hyperdense vessel or unexpected calcification. Skull: Normal. Negative for fracture or focal lesion. Sinuses/Orbits: Bilateral lens replacement. Paranasal sinuses and mastoid air cells are clear. Other: None CTA NECK FINDINGS Aortic arch: Standard branching. Imaged portion shows no evidence of aneurysm or dissection. No significant stenosis of the major arch vessel origins. Right carotid system: Calcified atherosclerotic plaque at the carotid bifurcation. Mild narrowing of the carotid bifurcation. Left carotid system: Calcified atherosclerotic  plaque at the bifurcation. Mild narrowing of the carotid bifurcation. Vertebral arteries: Right dominant system. Left vertebral artery predominantly terminates in a PICA Skeleton: Negative. Other neck: Negative. Upper chest: Moderate emphysema. Review of the MIP images confirms the above findings CTA HEAD FINDINGS Anterior circulation: There is a new focal occlusion 2 high-grade stenosis of a left M2 segment (series 8, image 96). There is persistent moderate narrowing of the M1 segment of the right MCA without evidence of occlusion. There is mild narrowing of the A2 segment of the right ACA. Posterior circulation: No significant stenosis, proximal occlusion, aneurysm, or vascular malformation. Venous sinuses: As permitted by contrast timing, patent. Anatomic variants: None Review of the MIP images confirms the above findings IMPRESSION: 1. New focal occlusion or high-grade stenosis of left M2 segments. 2. Possible regions of loss of gray white differentiation in the left inulsar cortex, which could represent sites of acute infarct. 3. Unchanged moderate narrowing of the M1 segment of the right MCA. 4. 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 Chronic infarcts in the bilateral caudate heads, left lentiform nucleus, bilateral cerebellar hemispheres, and in the corona radiata on the left. Findings were paged to Dr. Wilford Corner on 04/11/22 at 6:34 PM via [facility] paging system. Electronically Signed   By: Lorenza Cambridge M.D.   On: 04/11/2022 18:35    EKG: I independently viewed the EKG done and my findings are as followed: Normal sinus rhythm at a rate of 72 bpm  Assessment/Plan Present on Admission:  Acute ischemic stroke (HCC)  DVT (deep venous thrombosis) (HCC)  Essential hypertension  Mixed hyperlipidemia  Principal Problem:   Acute ischemic stroke (HCC) Active Problems:   Mixed hyperlipidemia   Essential hypertension   DVT (deep venous thrombosis) (HCC)   Iron deficiency anemia    Hypoalbuminemia due to protein-calorie malnutrition (HCC)   GERD (gastroesophageal reflux disease)  Acute ischemic stroke Patient will be admitted to telemetry unit  It does not appear that patient's symptoms were due to metabolic encephalopathy TSH was normal, ammonia level was normal, magnesium and phosphorus levels were normal urinalysis was unimpressive for UTI CT angiography of head  and neck showed  1. New focal occlusion or high-grade stenosis of left M2 segments. 2. Possible regions of loss of gray white differentiation in the left inulsar cortex, which could represent sites of acute infarct. Echocardiogram in the morning MRI of brain without contrast in the morning Continue aspirin and statin Continue fall precautions and neuro checks Lipid panel and hemoglobin A1c will be checked Continue PT/SLP/OT eval and treat Bedside swallow eval by nursing prior to diet Neurology was consulted and was following with the care of the patient.  Iron deficiency anemia Continue ferrous sulfate  Hypoalbuminemia possibly secondary to moderate protein calorie malnutrition Albumin 2.5, protein supplement will be provided  DVT Consider restarting patient's warfarin after MRI  Essential hypertension  Antihypertensives PRN if Blood pressure is greater than 220/120 or there is a concern for End organ damage/contraindications for permissive HTN. If blood pressure is greater than 220/120 give labetalol PO or IV or Vasotec IV with a goal of 15% reduction in BP during the first 24 hours.  Mixed hyperlipidemia Continue Crestor  GERD Continue Protonix  DVT prophylaxis: SCDs   Code Status: Full code  Family Communication: None at bedside  Consults: Neurology   Severity of Illness: The appropriate patient status for this patient is INPATIENT. Inpatient status is judged to be reasonable and necessary in order to provide the required intensity of service to ensure the patient's safety. The  patient's presenting symptoms, physical exam findings, and initial radiographic and laboratory data in the context of their chronic comorbidities is felt to place them at high risk for further clinical deterioration. Furthermore, it is not anticipated that the patient will be medically stable for discharge from the hospital within 2 midnights of admission.   * I certify that at the point of admission it is my clinical judgment that the patient will require inpatient hospital care spanning beyond 2 midnights from the point of admission due to high intensity of service, high risk for further deterioration and high frequency of surveillance required.*  Author: Frankey Shown, DO 04/12/2022 2:31 AM  For on call review [email or web address].

## 2022-04-11 NOTE — ED Triage Notes (Signed)
Pt BIB EMS due to a code stroke. Pt  is from nursing home and has an old stroke of the right side and had new left sided weakness per EMS. Nursing home went into room at 1700 to give pt food tray and was was having garbled speech and left sided weakness. Pt LSN was 1645. Unknown pts mentation baseline.

## 2022-04-11 NOTE — ED Notes (Signed)
Pt altered and screaming, unable to do swallow screen at this time.

## 2022-04-11 NOTE — ED Notes (Signed)
Raynelle Fanning [phone] pt daughter wanted to leave her number also for contact information.

## 2022-04-11 NOTE — ED Provider Notes (Signed)
[facility] EMERGENCY DEPARTMENT Provider Note   CSN: [id number] Arrival date & time: 04/11/22  1757  An emergency department physician performed an initial assessment on this suspected stroke patient at 1758.  History  Chief Complaint  Patient presents with   Code Stroke    Roberta Mercer is a 81 y.o. female.  HPI Roberta Mercer is a 81 y.o. female with history of CKD, DVT on Coumadin, stroke, hypertension and hyperlipidemia who presents from her skilled nursing facility today with aphasia and left-sided weakness but later may be reporting right-sided weakness.   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  She is wheelchair-bound at baseline. EMS was given last known well of 1645 hrs. but they were concerned that the time was not accurate  Symptoms were identified at 1700 when staff came to give her her dinner tray, she was noted to have difficulty speaking as well as left-sided weakness. There was no family member at bedside.  No one could be reached over the phone to confirm the last known well. Patient was unable to provide any history at this time. She is on Coumadin for a DVT/PE.  She was at 1 point in Eliquis, failed Eliquis and was put back on Coumadin.  Per the EMS verbal report, medications are given regularly to the patient at that facility with last dose of Coumadin as advised and was given within the last 24 hours    Home Medications Prior to Admission medications   Medication Sig Start Date End Date Taking? Authorizing Provider  acetaminophen (TYLENOL) 325 MG tablet Take 650 mg by mouth every 6 (six) hours as needed (pain).   Yes [provider]  amLODipine (NORVASC) 10 MG tablet Take 10 mg by mouth in the morning. (0900)   Yes [provider]  bisacodyl (DULCOLAX) 10 MG suppository Place 10 mg rectally daily as needed (no BM in 24 hours).   Yes [provider]  Cholecalciferol (VITAMIN D-3 PO) Take 1  tablet by mouth in the morning. (0800) Vitamin D3, unknown strength (per MAR)   Yes [provider]  Emollient (COLLAGEN EX) Apply 1 Application topically See admin instructions. Clean sacral wound with wound cleanser, pat dry, apply collagen particles to wound bed cover with bordered gauze every day shift.   Yes [provider]  folic acid (FOLVITE) 400 MCG tablet Take 400 mcg by mouth 2 (two) times daily. (0800, 2000)   Yes [provider]  lisinopril (ZESTRIL) 5 MG tablet Take 5 mg by mouth in the morning. (0800)   Yes [provider]  Magnesium 400 MG TABS Take 400 mg by mouth in the morning. (0900)   Yes [provider]  melatonin 5 MG TABS Take 5 mg by mouth at bedtime. (2000)   Yes [provider]  Nutritional Supplements (NUTRITIONAL DRINK) LIQD Take 120 mLs by mouth 2 (two) times daily. (0800, 2000)   Yes [provider]  Nutritional Supplements (PROSOURCE) LIQD Take 15 mLs by mouth 2 (two) times daily. (0800, 1700)   Yes [provider]  Omega-3 Fatty Acids (OMEGA-3 FISH OIL PO) Take 1 capsule by mouth in the morning. (0800)   Yes [provider]  pantoprazole (PROTONIX) 40 MG tablet Take 40 mg by mouth 2 (two) times daily. (0800, 2000)   Yes [provider]  polyethylene glycol powder (GLYCOLAX/MIRALAX) 17 GM/SCOOP powder Take 17 g by mouth in the morning. (0800)   Yes [provider]  rosuvastatin (CRESTOR) 20 MG tablet Take 20 mg by mouth at bedtime. (2000)   Yes [provider]  saccharomyces boulardii (FLORASTOR) 250 MG capsule Take 250 mg by mouth 2 (two) times daily. (0800, 2000)   Yes [provider]  senna (SENOKOT) 8.6 MG TABS tablet Take 8.6 mg by mouth in the morning. (0800)   Yes [provider]  thiamine (VITAMIN B-1) 100 MG tablet Take 100 mg by mouth in the morning. (0800)   Yes [provider]  topiramate (TOPAMAX) 25 MG tablet Take 25 mg by  mouth in the morning. (0800)   Yes [provider]  traMADol (ULTRAM) 50 MG tablet Take 50 mg by mouth every 12 (twelve) hours as needed (pain).   Yes [provider]  vitamin B-12 (CYANOCOBALAMIN) 500 MCG tablet Take 500 mcg by mouth in the morning. (0800)   Yes [provider]  warfarin (COUMADIN) 3 MG tablet Take 3 mg by mouth every evening. (2000)   Yes [provider]  amoxicillin (AMOXIL) 500 MG tablet Take 500 mg by mouth 2 (two) times daily. (03/26/22-03/30/22) Patient not taking: Reported on 04/11/2022    [provider]      Allergies    Bactrim [sulfamethoxazole-trimethoprim], Diovan [valsartan], and Sulfa antibiotics    Review of Systems   Review of Systems  Physical Exam Updated Vital Signs BP (!) 145/71   Pulse 73   Temp 99.7 F (37.6 C) (Oral)   Resp 19   Wt 59.8 kg   SpO2 99%   BMI 21.28 kg/m  Physical Exam Constitutional:      Comments: Patient is lying on her left side.  No respiratory distress.  She is awake and regarding me but difficult to communicate.  HENT:     Head: Normocephalic and atraumatic.     Mouth/Throat:     Pharynx: Oropharynx is clear.  Eyes:     Extraocular Movements: Extraocular movements intact.  Cardiovascular:     Rate and Rhythm: Normal rate and regular rhythm.  Pulmonary:     Effort: Pulmonary effort is normal.     Breath sounds: Normal breath sounds.  Abdominal:     General: There is no distension.     Palpations: Abdomen is soft.     Tenderness: There is no abdominal tenderness. There is no guarding.  Musculoskeletal:     Comments: Patient has both of her upper extremities crossed and held against her chest.  Both lower extremities are flexed at the knees and crossed at the ankles.  This appears to be a more chronic positioning of flexion.  No apparent wounds on the lower extremities, no significant peripheral edema.  Skin:    General: Skin is warm and dry.  Neurological:      Comments: Patient is regarding me as I speak to her but seems to have difficulty responding.  She does some repetitive words and cannot seem to generate full sentences.  Very difficult to perform exam strength testing as she is holding both arms clasped against her chest and has both legs flexed and crossed at the ankles.  She does not wish to extend at all she is resisting extension of her extremities.     ED Results / Procedures / Treatments   Labs (all labs ordered are listed, but only abnormal results are displayed) Labs Reviewed  PROTIME-INR - Abnormal; Notable for the following components:      Result Value   Prothrombin Time 30.4 (*)  INR 2.9 (*)    All other components within normal limits  APTT - Abnormal; Notable for the following components:   aPTT 71 (*)    All other components within normal limits  CBC - Abnormal; Notable for the following components:   Hemoglobin 10.4 (*)    HCT 35.6 (*)    MCHC 29.2 (*)    RDW 18.0 (*)    All other components within normal limits  COMPREHENSIVE METABOLIC PANEL - Abnormal; Notable for the following components:   Total Protein 6.4 (*)    Albumin 2.5 (*)    All other components within normal limits  VITAMIN B12 - Abnormal; Notable for the following components:   Vitamin B-12 1,153 (*)    All other components within normal limits  URINALYSIS, ROUTINE W REFLEX MICROSCOPIC - Abnormal; Notable for the following components:   APPearance CLOUDY (*)    Specific Gravity, Urine >1.046 (*)    Hgb urine dipstick MODERATE (*)    Protein, ur 30 (*)    Leukocytes,Ua LARGE (*)    Bacteria, UA RARE (*)    All other components within normal limits  I-STAT CHEM 8, ED - Abnormal; Notable for the following components:   Hemoglobin 11.6 (*)    HCT 34.0 (*)    All other components within normal limits  I-STAT CHEM 8, ED - Abnormal; Notable for the following components:   Hemoglobin 10.5 (*)    HCT 31.0 (*)    All other components within normal  limits  URINE CULTURE  DIFFERENTIAL  ETHANOL  AMMONIA  TSH  MAGNESIUM  PHOSPHORUS  CBG MONITORING, ED  I-STAT VENOUS BLOOD GAS, ED    EKG EKG Interpretation  Date/Time:  Tuesday April 11 2022 18:28:54 EST Ventricular Rate:  72 PR Interval:    QRS Duration: 97 QT Interval:  411 QTC Calculation: 450 R Axis:   68 Text Interpretation: SR no acute ischemic appearance. no old comparison Confirmed by Arby Barrettefeiffer, Angelita Harnack [phone]([id number]) on 04/11/2022 11:06:35 PM  Radiology DG Chest Port 1 View  Result Date: 04/11/2022 CLINICAL DATA:  Altered mental status EXAM: PORTABLE CHEST 1 VIEW COMPARISON:  03/22/2022 FINDINGS: Lungs are symmetrically well expanded. 12 mm rim calcified density within the right mid lung zone appears new from prior examination and may be artifactual in nature, related to a confluence of osseous and vascular shadows, given its relatively rapid development. No pneumothorax or pleural effusion. Cardiac size within normal limits. Retrocardiac density again noted likely reflecting the previously identified hiatal hernia. Pulmonary vascularity is normal. No acute bone abnormality. IMPRESSION: 1. No active disease. 2. Moderate hiatal hernia. 3. 12 mm rim calcified density within the right mid lung zone, favored to be artifactual. This could be confirmed with a standard two view chest radiograph once the patient's acute issues have resolved. Electronically Signed   By: Helyn NumbersAshesh  Parikh M.D.   On: 04/11/2022 20:54   CT HEAD CODE STROKE WO CONTRAST  Result Date: 04/11/2022 CLINICAL DATA:  Code stroke.  Left sided weakness. EXAM: CT ANGIOGRAPHY HEAD AND NECK TECHNIQUE: Multidetector CT imaging of the head and neck was performed using the standard protocol during bolus administration of intravenous contrast. Multiplanar CT image reconstructions and MIPs were obtained to evaluate the vascular anatomy. Carotid stenosis measurements (when applicable) are obtained utilizing NASCET criteria, using  the distal internal carotid diameter as the denominator. RADIATION DOSE REDUCTION: This exam was performed according to the departmental dose-optimization program which includes automated exposure control, adjustment of the mA  and/or kV according to patient size and/or use of iterative reconstruction technique. CONTRAST:  75mL OMNIPAQUE IOHEXOL 350 MG/ML SOLN COMPARISON:  CT Head 03/22/22, CTA head/neck 01/09/22 FINDINGS: CT HEAD FINDINGS Brain: Possible new region of poor gray white differentiation in the left insular cortex (series 3, image 18). 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 There are chronic infarcts in the bilateral caudate heads, left lentiform nucleus, bilateral cerebellar hemispheres, in the corona radiata on the left. Compared to prior exam there appears to be new age indeterminate infarct in the left thalamus. No hemorrhage. No hydrocephalus. No extra-axial fluid collection. No hyperdense vessel. Vascular: No hyperdense vessel or unexpected calcification. Skull: Normal. Negative for fracture or focal lesion. Sinuses/Orbits: Bilateral lens replacement. Paranasal sinuses and mastoid air cells are clear. Other: None CTA NECK FINDINGS Aortic arch: Standard branching. Imaged portion shows no evidence of aneurysm or dissection. No significant stenosis of the major arch vessel origins. Right carotid system: Calcified atherosclerotic plaque at the carotid bifurcation. Mild narrowing of the carotid bifurcation. Left carotid system: Calcified atherosclerotic plaque at the bifurcation. Mild narrowing of the carotid bifurcation. Vertebral arteries: Right dominant system. Left vertebral artery predominantly terminates in a PICA Skeleton: Negative. Other neck: Negative. Upper chest: Moderate emphysema. Review of the MIP images confirms the above findings CTA HEAD FINDINGS Anterior circulation: There is a new focal occlusion 2 high-grade stenosis of a left M2 segment (series 8, image 96). There  is persistent moderate narrowing of the M1 segment of the right MCA without evidence of occlusion. There is mild narrowing of the A2 segment of the right ACA. Posterior circulation: No significant stenosis, proximal occlusion, aneurysm, or vascular malformation. Venous sinuses: As permitted by contrast timing, patent. Anatomic variants: None Review of the MIP images confirms the above findings IMPRESSION: 1. New focal occlusion or high-grade stenosis of left M2 segments. 2. Possible regions of loss of gray white differentiation in the left inulsar cortex, which could represent sites of acute infarct. 3. Unchanged moderate narrowing of the M1 segment of the right MCA. 4. 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 Chronic infarcts in the bilateral caudate heads, left lentiform nucleus, bilateral cerebellar hemispheres, and in the corona radiata on the left. Findings were paged to Dr. Wilford Corner on 04/11/22 at 6:34 PM via [facility] paging system. Electronically Signed   By: Lorenza Cambridge M.D.   On: 04/11/2022 18:35   CT ANGIO HEAD NECK W WO CM (CODE STROKE)  Result Date: 04/11/2022 CLINICAL DATA:  Code stroke.  Left sided weakness. EXAM: CT ANGIOGRAPHY HEAD AND NECK TECHNIQUE: Multidetector CT imaging of the head and neck was performed using the standard protocol during bolus administration of intravenous contrast. Multiplanar CT image reconstructions and MIPs were obtained to evaluate the vascular anatomy. Carotid stenosis measurements (when applicable) are obtained utilizing NASCET criteria, using the distal internal carotid diameter as the denominator. RADIATION DOSE REDUCTION: This exam was performed according to the departmental dose-optimization program which includes automated exposure control, adjustment of the mA and/or kV according to patient size and/or use of iterative reconstruction technique. CONTRAST:  75mL OMNIPAQUE IOHEXOL 350 MG/ML SOLN COMPARISON:  CT Head 03/22/22, CTA head/neck 01/09/22  FINDINGS: CT HEAD FINDINGS Brain: Possible new region of poor gray white differentiation in the left insular cortex (series 3, image 18). 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 There are chronic infarcts in the bilateral caudate heads, left lentiform nucleus, bilateral cerebellar hemispheres, in the corona radiata on the left. Compared to prior exam there appears  to be new age indeterminate infarct in the left thalamus. No hemorrhage. No hydrocephalus. No extra-axial fluid collection. No hyperdense vessel. Vascular: No hyperdense vessel or unexpected calcification. Skull: Normal. Negative for fracture or focal lesion. Sinuses/Orbits: Bilateral lens replacement. Paranasal sinuses and mastoid air cells are clear. Other: None CTA NECK FINDINGS Aortic arch: Standard branching. Imaged portion shows no evidence of aneurysm or dissection. No significant stenosis of the major arch vessel origins. Right carotid system: Calcified atherosclerotic plaque at the carotid bifurcation. Mild narrowing of the carotid bifurcation. Left carotid system: Calcified atherosclerotic plaque at the bifurcation. Mild narrowing of the carotid bifurcation. Vertebral arteries: Right dominant system. Left vertebral artery predominantly terminates in a PICA Skeleton: Negative. Other neck: Negative. Upper chest: Moderate emphysema. Review of the MIP images confirms the above findings CTA HEAD FINDINGS Anterior circulation: There is a new focal occlusion 2 high-grade stenosis of a left M2 segment (series 8, image 96). There is persistent moderate narrowing of the M1 segment of the right MCA without evidence of occlusion. There is mild narrowing of the A2 segment of the right ACA. Posterior circulation: No significant stenosis, proximal occlusion, aneurysm, or vascular malformation. Venous sinuses: As permitted by contrast timing, patent. Anatomic variants: None Review of the MIP images confirms the above findings IMPRESSION: 1. New  focal occlusion or high-grade stenosis of left M2 segments. 2. Possible regions of loss of gray white differentiation in the left inulsar cortex, which could represent sites of acute infarct. 3. Unchanged moderate narrowing of the M1 segment of the right MCA. 4. 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 Chronic infarcts in the bilateral caudate heads, left lentiform nucleus, bilateral cerebellar hemispheres, and in the corona radiata on the left. Findings were paged to Dr. Wilford Corner on 04/11/22 at 6:34 PM via [facility] paging system. Electronically Signed   By: Lorenza Cambridge M.D.   On: 04/11/2022 18:35    Procedures Procedures    Medications Ordered in ED Medications  sodium chloride flush (NS) 0.9 % injection 3 mL (3 mLs Intravenous Given 04/11/22 1837)  iohexol (OMNIPAQUE) 350 MG/ML injection 75 mL (75 mLs Intravenous Contrast Given 04/11/22 1811)  LORazepam (ATIVAN) injection 1 mg (1 mg Intravenous Given 04/11/22 2051)    ED Course/ Medical Decision Making/ A&P                           Medical Decision Making Amount and/or Complexity of Data Reviewed Labs: ordered. Radiology: ordered.  Risk Decision regarding hospitalization.   Patient has history complicated by dementia and prior stroke, wheelchair bound at baseline.  Nursing staff is noted difficulty speaking and left-sided weakness.  Patient arrived as code stroke.  I have not obtained any additional history from the patient.  CTA identifies new stenosis of left M2 segment.   Differential diagnosis includes stroke as well as toxic metabolic encephalopathy.  I have reviewed evaluation with Dr. Jerrell Belfast with recommendations for including UA\chest x-ray\B12\TSH and ammonia for workup.  EEG ordered by neurology.  MRI attempted for workup.  At this time patient is more agitated with Ativan.  I also suspect physical constraints with appearance of some contractures and difficulty lying flat as well as some pre-existing dementia,  significant difficulty with obtaining MRI without heavy sedation.  At this time will defer MRI and proceed with admission and metabolic evaluation.  Patient's blood pressures and heart rate remained stable.  Airway is protected.  Stable for admission.  Final Clinical Impression(s) / ED Diagnoses Final diagnoses:  Cerebrovascular accident (CVA), unspecified mechanism (HCC)  Severe comorbid illness    Rx / DC Orders ED Discharge Orders     None         Arby Barrette, MD 04/11/22 2339

## 2022-04-11 NOTE — ED Notes (Signed)
Unable to test swallowing due to patient's agitation/ unable to follow commands due to dementia .

## 2022-04-11 NOTE — ED Notes (Signed)
Patient transported to MRI 

## 2022-04-11 NOTE — Progress Notes (Signed)
EEG complete - results pending 

## 2022-04-12 ENCOUNTER — Encounter (HOSPITAL_COMMUNITY): Payer: Self-pay | Admitting: Internal Medicine

## 2022-04-12 ENCOUNTER — Inpatient Hospital Stay (HOSPITAL_COMMUNITY): Payer: Medicare Other

## 2022-04-12 DIAGNOSIS — I639 Cerebral infarction, unspecified: Secondary | ICD-10-CM | POA: Diagnosis not present

## 2022-04-12 DIAGNOSIS — R4182 Altered mental status, unspecified: Secondary | ICD-10-CM

## 2022-04-12 DIAGNOSIS — Z7901 Long term (current) use of anticoagulants: Secondary | ICD-10-CM

## 2022-04-12 DIAGNOSIS — K219 Gastro-esophageal reflux disease without esophagitis: Secondary | ICD-10-CM | POA: Insufficient documentation

## 2022-04-12 DIAGNOSIS — E46 Unspecified protein-calorie malnutrition: Secondary | ICD-10-CM | POA: Insufficient documentation

## 2022-04-12 DIAGNOSIS — I6389 Other cerebral infarction: Secondary | ICD-10-CM

## 2022-04-12 DIAGNOSIS — E8809 Other disorders of plasma-protein metabolism, not elsewhere classified: Secondary | ICD-10-CM | POA: Insufficient documentation

## 2022-04-12 DIAGNOSIS — D509 Iron deficiency anemia, unspecified: Secondary | ICD-10-CM | POA: Insufficient documentation

## 2022-04-12 LAB — URINE CULTURE

## 2022-04-12 LAB — CBC
HCT: 35.7 % — ABNORMAL LOW (ref 36.0–46.0)
Hemoglobin: 10.9 g/dL — ABNORMAL LOW (ref 12.0–15.0)
MCH: 27 pg (ref 26.0–34.0)
MCHC: 30.5 g/dL (ref 30.0–36.0)
MCV: 88.4 fL (ref 80.0–100.0)
Platelets: 201 10*3/uL (ref 150–400)
RBC: 4.04 MIL/uL (ref 3.87–5.11)
RDW: 17.8 % — ABNORMAL HIGH (ref 11.5–15.5)
WBC: 8.5 10*3/uL (ref 4.0–10.5)
nRBC: 0 % (ref 0.0–0.2)

## 2022-04-12 LAB — BASIC METABOLIC PANEL
Anion gap: 5 (ref 5–15)
BUN: 11 mg/dL (ref 8–23)
CO2: 25 mmol/L (ref 22–32)
Calcium: 9.3 mg/dL (ref 8.9–10.3)
Chloride: 107 mmol/L (ref 98–111)
Creatinine, Ser: 0.91 mg/dL (ref 0.44–1.00)
GFR, Estimated: >60 mL/min (ref >60)
Glucose, Bld: 83 mg/dL (ref 70–99)
Potassium: 3.8 mmol/L (ref 3.5–5.1)
Sodium: 137 mmol/L (ref 135–145)

## 2022-04-12 LAB — LIPID PANEL
Cholesterol: 141 mg/dL (ref 0–200)
HDL: 70 mg/dL (ref >40)
LDL Cholesterol: 44 mg/dL (ref 0–99)
Total CHOL/HDL Ratio: 2 RATIO
Triglycerides: 133 mg/dL (ref <150)
VLDL: 27 mg/dL (ref 0–40)

## 2022-04-12 LAB — ECHOCARDIOGRAM LIMITED
Area-P 1/2: 2.22 cm2
S' Lateral: 2.3 cm
Weight: 2109.36 oz

## 2022-04-12 LAB — MAGNESIUM: Magnesium: 1.8 mg/dL (ref 1.7–2.4)

## 2022-04-12 MED ORDER — FERROUS SULFATE 325 (65 FE) MG PO TABS
325.0000 mg | ORAL_TABLET | ORAL | Status: DC
  Administered 2022-04-16 – 2022-04-18 (×2): 325 mg via ORAL
  Filled 2022-04-12 (×4): qty 1

## 2022-04-12 MED ORDER — LORAZEPAM 2 MG/ML IJ SOLN
1.0000 mg | Freq: Once | INTRAMUSCULAR | Status: DC
  Filled 2022-04-12: qty 1

## 2022-04-12 MED ORDER — ASPIRIN 81 MG PO TBEC
81.0000 mg | DELAYED_RELEASE_TABLET | Freq: Every day | ORAL | Status: AC
  Filled 2022-04-12: qty 1

## 2022-04-12 MED ORDER — ROSUVASTATIN CALCIUM 20 MG PO TABS
20.0000 mg | ORAL_TABLET | Freq: Every day | ORAL | Status: DC
  Administered 2022-04-12 – 2022-04-15 (×3): 20 mg via ORAL
  Filled 2022-04-12 (×7): qty 1

## 2022-04-12 MED ORDER — ENSURE ENLIVE PO LIQD
237.0000 mL | Freq: Two times a day (BID) | ORAL | Status: DC
  Administered 2022-04-16 – 2022-04-21 (×9): 237 mL via ORAL
  Filled 2022-04-12: qty 237

## 2022-04-12 MED ORDER — ACETAMINOPHEN 650 MG RE SUPP
650.0000 mg | RECTAL | Status: DC | PRN
  Filled 2022-04-12: qty 1

## 2022-04-12 MED ORDER — MAGNESIUM SULFATE 2 GM/50ML IV SOLN
2.0000 g | Freq: Once | INTRAVENOUS | Status: AC
  Administered 2022-04-12: 2 g via INTRAVENOUS
  Filled 2022-04-12: qty 50

## 2022-04-12 MED ORDER — ACETAMINOPHEN 325 MG PO TABS
650.0000 mg | ORAL_TABLET | ORAL | Status: DC | PRN
  Administered 2022-04-15 – 2022-04-18 (×3): 650 mg via ORAL
  Filled 2022-04-12 (×4): qty 2

## 2022-04-12 MED ORDER — PANTOPRAZOLE SODIUM 40 MG PO TBEC
40.0000 mg | DELAYED_RELEASE_TABLET | Freq: Two times a day (BID) | ORAL | Status: DC
  Administered 2022-04-12 – 2022-04-15 (×4): 40 mg via ORAL
  Filled 2022-04-12 (×5): qty 1

## 2022-04-12 MED ORDER — STROKE: EARLY STAGES OF RECOVERY BOOK
Freq: Once | Status: AC
  Filled 2022-04-12: qty 1

## 2022-04-12 MED ORDER — ACETAMINOPHEN 160 MG/5ML PO SOLN
650.0000 mg | ORAL | Status: DC | PRN
  Administered 2022-04-16 – 2022-04-17 (×2): 650 mg
  Filled 2022-04-12 (×2): qty 20.3

## 2022-04-12 NOTE — Procedures (Signed)
Patient Name: Roberta Mercer  MRN: [id number]  Epilepsy Attending: Charlsie Quest  Referring Physician/Provider: Marjorie Smolder, NP  Date: 04/11/2022 Duration: 21.52 mins  Patient history:  81 year old patient with history of stroke with residual right-sided weakness, hypertension, hyperlipidemia, DVT on Coumadin and CKD presented from skilled nursing facility with aphasia and initial report of left-sided weakness, and later possibly change to right-sided weakness.  EEG to evaluate for seizure.  Level of alertness: Awake  AEDs during EEG study: None  Technical aspects: This EEG study was done with scalp electrodes positioned according to the 10-20 International system of electrode placement. Electrical activity was reviewed with band pass filter of 1-70Hz , sensitivity of 7 uV/mm, display speed of 58mm/sec with a 60Hz  notched filter applied as appropriate. EEG data were recorded continuously and digitally stored.  Video monitoring was available and reviewed as appropriate.  Description: No clear posterior dominant rhythm was seen.  EEG showed continuous generalized predominantly 5 to 9 Hz theta-alpha activity admixed with intermittent generalized 2 to 3 Hz delta slowing. Hyperventilation and photic stimulation were not performed.     ABNORMALITY - Continuous slow, generalized  IMPRESSION: This study is suggestive of mild to moderate diffuse encephalopathy, nonspecific etiology. No seizures or epileptiform discharges were seen throughout the recording.  [name] 

## 2022-04-12 NOTE — ED Notes (Signed)
Pt's brief and sheets were changed. Pt is now sleeping in bed breathing regular, unlabored respirations

## 2022-04-12 NOTE — Progress Notes (Signed)
SLP Cancellation Note  Patient Details Name: Roberta Mercer MRN: [id number] DOB: 1941-01-27   Cancelled treatment:       Reason Eval/Treat Not Completed: Other (comment) deferring formal cognitive linguistic evaluation for now as pt pending palliative consult to establish goals of care and with reported poor quality of life, DNR. Pt would not be able to participate in formal cognitive linguistic evaluation presently as she is with severe cognitive deficits exhibited at bedside. Pt did have some jargon speech but no intellible comprehendable words. Suspect cognitive deficits are from past CVAs noted on imaging, current CVA,  and likely dementia. Unclear of exact cognitive baseline as no family or caregivers present at bedside.    Ardyth Gal MA, CCC-SLP Acute Rehabilitation Services   04/12/2022, 1:54 PM

## 2022-04-12 NOTE — Progress Notes (Signed)
  Echocardiogram 2D Echocardiogram has been performed.  Roberta Mercer 04/12/2022, 11:58 AM

## 2022-04-12 NOTE — Progress Notes (Addendum)
PROGRESS NOTE  Roberta Mercer  ONG:[id number] DOB: Dec 24, 1940 DOA: 04/11/2022 PCP: Lise Auer, MD   Brief Narrative: Patient is a 81 year old female with past medical history of DVT on Coumadin, hypertension, hyperlipidemia, stroke with right-sided residual weakness, GERD who presents from skilled nursing facility for further evaluation of left-sided weakness.  She was noted to have left-sided weakness, difficulty speaking at nursing facility.  Patient is wheelchair-bound at baseline.  On admission ,she was mildly hypertensive.   CT angio head and neck showed a new focal occlusion or high-grade stenosis of the left M2 segment, severe chronic microvascular ischemic disease, chronic infarcts, suspicious for acute infarct in the left insular cortex.  MRI pending.  Neurology consulted.Remains confused this mrng  Assessment & Plan:  Principal Problem:   Acute ischemic stroke [facility]) Active Problems:   Mixed hyperlipidemia   Essential hypertension   DVT (deep venous thrombosis) (HCC)   Iron deficiency anemia   Hypoalbuminemia due to protein-calorie malnutrition (HCC)   GERD (gastroesophageal reflux disease)  Acute ischemic stroke: Presented from nursing facility with left-sided weakness, slurred speech.  History of prior stroke with right-sided weakness.  CT imaging suspicious for acute infarct.  MRI pending.  Neurology following. Continue statin, aspirin.  Stroke workup initiated.  LDL of 44, A1c pending.  PT/speech/OT evaluation pending.  Altered mental status: Most likely from acute metabolic encephalopathy from stroke.  EEG showed mild to moderate diffuse encephalopathy, nonspecific allergy, no seizures or epileptiform discharges.  Monitor mentation.  Delirium precautions.  As per her family friend Aletha Halim, she is most of the time confused at baseline.  Hypertension: Currently antihypertensives on hold for allowing permissive hypertension.  Continue as needed meds for severe hypertension.   Monitor blood pressure  History of DVT: On warfarin at home, currently on hold.  Monitor INR.  Resume warfarin if MRI confirms ischemic stroke/neurology clearance   Iron deficient anemia: Continue iron supplementation  GERD: Continue Protonix  Debility/deconditioning: History of prior stroke.  Wheelchair-bound.  Confused at baseline.Lives at nursing facility.  Now with new stroke.  Very poor quality of life.  We we will request palliative care consultation regarding goals of care discussion.  CODE STATUS is DNR.         DVT prophylaxis:SCD's Start: 04/12/22 4401     Code Status: DNR  Family Communication: Called and discussed with friend Aletha Halim on phone on 12/27  Patient status:Inpatient  Patient is from :SNF  Anticipated discharge to:SNF?  Estimated DC date:after completion of stroke work up   Consultants: Neurology  Procedures:None  Antimicrobials:  Anti-infectives (From admission, onward)    None       Subjective: Patient seen and examined at bedside today.  Hemodynamically stable lying in bed.  Completely confused, does not follow any command.  Opens her eyes on calling her.  Does not look in any Distress.  Objective: Vitals:   04/12/22 0427 04/12/22 0500 04/12/22 0530 04/12/22 0630  BP:  (!) 102/58 (!) 120/53 (!) 150/53  Pulse:  82 78 77  Resp:  16 (!) 23 16  Temp: 98.2 F (36.8 C)     TempSrc: Oral     SpO2:  95% 96% 96%  Weight:       No intake or output data in the 24 hours ending 04/12/22 0757 Filed Weights   04/11/22 1800  Weight: 59.8 kg    Examination:  General exam: Overall comfortable, not in distress, very deconditioned, confused HEENT: PERRL Respiratory system:  no wheezes or crackles  Cardiovascular system: S1 & S2 heard, RRR.  Pansystolic murmur heard Gastrointestinal system: Abdomen is nondistended, soft and nontender. Central nervous system: Not alert or oriented, does not follow commands Extremities: No edema, no  clubbing ,no cyanosis Skin: No rashes, no ulcers,no icterus     Data Reviewed: I have personally reviewed following labs and imaging studies  CBC: Recent Labs  Lab 04/11/22 1758 04/11/22 1809 04/11/22 2034 04/12/22 0315  WBC 8.3  --   --  8.5  NEUTROABS 4.3  --   --   --   HGB 10.4* 11.6* 10.5* 10.9*  HCT 35.6* 34.0* 31.0* 35.7*  MCV 90.1  --   --  88.4  PLT 199  --   --  123456   Basic Metabolic Panel: Recent Labs  Lab 04/11/22 1758 04/11/22 1809 04/11/22 2030 04/11/22 2034 04/12/22 0315  NA 138 139  --  139 137  K 4.6 4.5  --  3.8 3.8  CL 108 107  --  105 107  CO2 24  --   --   --  25  GLUCOSE 99 97  --  92 83  BUN 11 14  --  11 11  CREATININE 0.91 0.90  --  0.80 0.91  CALCIUM 9.2  --   --   --  9.3  MG  --   --  1.7  --  1.8  PHOS  --   --  2.7  --   --      No results found for this or any previous visit (from the past 240 hour(s)).   Radiology Studies: DG Chest Port 1 View  Result Date: 04/11/2022 CLINICAL DATA:  Altered mental status EXAM: PORTABLE CHEST 1 VIEW COMPARISON:  03/22/2022 FINDINGS: Lungs are symmetrically well expanded. 12 mm rim calcified density within the right mid lung zone appears new from prior examination and may be artifactual in nature, related to a confluence of osseous and vascular shadows, given its relatively rapid development. No pneumothorax or pleural effusion. Cardiac size within normal limits. Retrocardiac density again noted likely reflecting the previously identified hiatal hernia. Pulmonary vascularity is normal. No acute bone abnormality. IMPRESSION: 1. No active disease. 2. Moderate hiatal hernia. 3. 12 mm rim calcified density within the right mid lung zone, favored to be artifactual. This could be confirmed with a standard two view chest radiograph once the patient's acute issues have resolved. Electronically Signed   By: Fidela Salisbury M.D.   On: 04/11/2022 20:54   CT HEAD CODE STROKE WO CONTRAST  Result Date:  04/11/2022 CLINICAL DATA:  Code stroke.  Left sided weakness. EXAM: CT ANGIOGRAPHY HEAD AND NECK TECHNIQUE: Multidetector CT imaging of the head and neck was performed using the standard protocol during bolus administration of intravenous contrast. Multiplanar CT image reconstructions and MIPs were obtained to evaluate the vascular anatomy. Carotid stenosis measurements (when applicable) are obtained utilizing NASCET criteria, using the distal internal carotid diameter as the denominator. RADIATION DOSE REDUCTION: This exam was performed according to the departmental dose-optimization program which includes automated exposure control, adjustment of the mA and/or kV according to patient size and/or use of iterative reconstruction technique. CONTRAST:  32mL OMNIPAQUE IOHEXOL 350 MG/ML SOLN COMPARISON:  CT Head 03/22/22, CTA head/neck 01/09/22 FINDINGS: CT HEAD FINDINGS Brain: Possible new region of poor gray white differentiation in the left insular cortex (series 3, image 18). 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 There are chronic infarcts in the bilateral caudate heads, left lentiform nucleus, bilateral  cerebellar hemispheres, in the corona radiata on the left. Compared to prior exam there appears to be new age indeterminate infarct in the left thalamus. No hemorrhage. No hydrocephalus. No extra-axial fluid collection. No hyperdense vessel. Vascular: No hyperdense vessel or unexpected calcification. Skull: Normal. Negative for fracture or focal lesion. Sinuses/Orbits: Bilateral lens replacement. Paranasal sinuses and mastoid air cells are clear. Other: None CTA NECK FINDINGS Aortic arch: Standard branching. Imaged portion shows no evidence of aneurysm or dissection. No significant stenosis of the major arch vessel origins. Right carotid system: Calcified atherosclerotic plaque at the carotid bifurcation. Mild narrowing of the carotid bifurcation. Left carotid system: Calcified atherosclerotic  plaque at the bifurcation. Mild narrowing of the carotid bifurcation. Vertebral arteries: Right dominant system. Left vertebral artery predominantly terminates in a PICA Skeleton: Negative. Other neck: Negative. Upper chest: Moderate emphysema. Review of the MIP images confirms the above findings CTA HEAD FINDINGS Anterior circulation: There is a new focal occlusion 2 high-grade stenosis of a left M2 segment (series 8, image 96). There is persistent moderate narrowing of the M1 segment of the right MCA without evidence of occlusion. There is mild narrowing of the A2 segment of the right ACA. Posterior circulation: No significant stenosis, proximal occlusion, aneurysm, or vascular malformation. Venous sinuses: As permitted by contrast timing, patent. Anatomic variants: None Review of the MIP images confirms the above findings IMPRESSION: 1. New focal occlusion or high-grade stenosis of left M2 segments. 2. Possible regions of loss of gray white differentiation in the left inulsar cortex, which could represent sites of acute infarct. 3. Unchanged moderate narrowing of the M1 segment of the right MCA. 4. 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 Chronic infarcts in the bilateral caudate heads, left lentiform nucleus, bilateral cerebellar hemispheres, and in the corona radiata on the left. Findings were paged to Dr. Rory Percy on 04/11/22 at 6:34 PM via [facility] paging system. Electronically Signed   By: Marin Roberts M.D.   On: 04/11/2022 18:35   CT ANGIO HEAD NECK W WO CM (CODE STROKE)  Result Date: 04/11/2022 CLINICAL DATA:  Code stroke.  Left sided weakness. EXAM: CT ANGIOGRAPHY HEAD AND NECK TECHNIQUE: Multidetector CT imaging of the head and neck was performed using the standard protocol during bolus administration of intravenous contrast. Multiplanar CT image reconstructions and MIPs were obtained to evaluate the vascular anatomy. Carotid stenosis measurements (when applicable) are obtained utilizing  NASCET criteria, using the distal internal carotid diameter as the denominator. RADIATION DOSE REDUCTION: This exam was performed according to the departmental dose-optimization program which includes automated exposure control, adjustment of the mA and/or kV according to patient size and/or use of iterative reconstruction technique. CONTRAST:  31mL OMNIPAQUE IOHEXOL 350 MG/ML SOLN COMPARISON:  CT Head 03/22/22, CTA head/neck 01/09/22 FINDINGS: CT HEAD FINDINGS Brain: Possible new region of poor gray white differentiation in the left insular cortex (series 3, image 18). 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 There are chronic infarcts in the bilateral caudate heads, left lentiform nucleus, bilateral cerebellar hemispheres, in the corona radiata on the left. Compared to prior exam there appears to be new age indeterminate infarct in the left thalamus. No hemorrhage. No hydrocephalus. No extra-axial fluid collection. No hyperdense vessel. Vascular: No hyperdense vessel or unexpected calcification. Skull: Normal. Negative for fracture or focal lesion. Sinuses/Orbits: Bilateral lens replacement. Paranasal sinuses and mastoid air cells are clear. Other: None CTA NECK FINDINGS Aortic arch: Standard branching. Imaged portion shows no evidence of aneurysm or dissection. No significant stenosis of  the major arch vessel origins. Right carotid system: Calcified atherosclerotic plaque at the carotid bifurcation. Mild narrowing of the carotid bifurcation. Left carotid system: Calcified atherosclerotic plaque at the bifurcation. Mild narrowing of the carotid bifurcation. Vertebral arteries: Right dominant system. Left vertebral artery predominantly terminates in a PICA Skeleton: Negative. Other neck: Negative. Upper chest: Moderate emphysema. Review of the MIP images confirms the above findings CTA HEAD FINDINGS Anterior circulation: There is a new focal occlusion 2 high-grade stenosis of a left M2 segment  (series 8, image 96). There is persistent moderate narrowing of the M1 segment of the right MCA without evidence of occlusion. There is mild narrowing of the A2 segment of the right ACA. Posterior circulation: No significant stenosis, proximal occlusion, aneurysm, or vascular malformation. Venous sinuses: As permitted by contrast timing, patent. Anatomic variants: None Review of the MIP images confirms the above findings IMPRESSION: 1. New focal occlusion or high-grade stenosis of left M2 segments. 2. Possible regions of loss of gray white differentiation in the left inulsar cortex, which could represent sites of acute infarct. 3. Unchanged moderate narrowing of the M1 segment of the right MCA. 4. 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 Chronic infarcts in the bilateral caudate heads, left lentiform nucleus, bilateral cerebellar hemispheres, and in the corona radiata on the left. Findings were paged to Dr. Rory Percy on 04/11/22 at 6:34 PM via [facility] paging system. Electronically Signed   By: Marin Roberts M.D.   On: 04/11/2022 18:35    Scheduled Meds:  [START ON 04/13/2022]  stroke: early stages of recovery book   Does not apply Once   aspirin EC  81 mg Oral Daily   feeding supplement  237 mL Oral BID BM   ferrous sulfate  325 mg Oral QODAY   Continuous Infusions:   LOS: 1 day   Shelly Coss, MD [facility] P12/27/2023, 7:57 AM

## 2022-04-12 NOTE — Evaluation (Signed)
Clinical/Bedside Swallow Evaluation Patient Details  Name: Roberta Mercer MRN: [id number] Date of Birth: April 08, 1941  Today's Date: 04/12/2022 Time: SLP Start Time (ACUTE ONLY): 1324 SLP Stop Time (ACUTE ONLY): 1340 SLP Time Calculation (min) (ACUTE ONLY): 16 min  Past Medical History:  Past Medical History:  Diagnosis Date   CKD (chronic kidney disease)    Diastolic dysfunction 06/05/2019   DVT (deep venous thrombosis) (HCC)    Esophageal spasm    Essential hypertension 03/03/2019   Essential tremor    History of CVA (cerebrovascular accident) 05/11/2020   History of stroke 11/14/2014   History of venous thromboembolism    Hyperlipidemia    LVH (left ventricular hypertrophy) 06/05/2019   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   Nonrheumatic aortic valve stenosis 06/05/2019   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   Post-menopausal atrophic vaginitis    Stage 3a chronic kidney disease (HCC) 06/05/2019   Stroke (HCC)    TIA (transient ischemic attack)    Past Surgical History:  Past Surgical History:  Procedure Laterality Date   CATARACT EXTRACTION     ELBOW SURGERY Left    to repair nerve/ not successful   TRIGGER FINGER RELEASE     HPI:  Patient is a 81 year old female with past medical history of DVT on Coumadin, hypertension, hyperlipidemia, stroke with right-sided residual weakness, GERD who presents from skilled nursing facility for further evaluation of left-sided weakness.  She was noted to have left-sided weakness, difficulty speaking at nursing facility.  Patient is wheelchair-bound at baseline.  On admission ,she was mildly hypertensive.   CT angio head and neck showed a new focal occlusion or high-grade stenosis of the left M2 segment, severe chronic microvascular ischemic disease, chronic infarcts, suspicious for acute infarct in the left insular cortex.  MRI pending. EEG impression : mild to moderate diffuse encephalopathy, nonspecific etiology. No  seizures or epileptiform discharges were seen throughout the recording." Per MD notes, palliative consult requested, pt DNR with poor quality of life.    Assessment / Plan / Recommendation  Clinical Impression  Pt presents with a more cognitive based dysphagia. Pt aroused for clinical swallow evaluation but was unable to follow commands with severe cognitive deficits appreciated (per chart review confused at baseline, paired with novel CVA this admit). Pt with adequate dentition but unable to follow commands for formal oral motor exam, no appreciable weakness noted during clinical interaction. Pt with variable oral readiness (did not accept tsp of ice chips but accepted puree by teaspoon, small pieces of graham crackers, and sips of thin liquids via straw. Pt appears more coordinated cognitively with use of straw with thin liquids. No overt s/sx of aspiration noted with any PO. Pt higher risk with severe cogntiive deficits. Recommend dysphagia 3 (mechanical soft) and thin liquids with meds crushed in puree and full supervision with all PO. SLP to follow up for diet tolerance.  SLP Visit Diagnosis: Dysphagia, unspecified (R13.10)    Aspiration Risk  Mild aspiration risk;Moderate aspiration risk    Diet Recommendation   Dysphagia 3 (mechanical soft) thin liquids; full supervision with all PO  Medication Administration: Crushed with puree    Other  Recommendations Oral Care Recommendations: Oral care BID    Recommendations for follow up therapy are one component of a multi-disciplinary discharge planning process, led by the attending physician.  Recommendations may be updated based on patient status, additional functional criteria and insurance authorization.  Follow up Recommendations Skilled nursing-short term rehab (<3 hours/day)  Assistance Recommended at Discharge    Functional Status Assessment Patient has had a recent decline in their functional status and/or demonstrates limited  ability to make significant improvements in function in a reasonable and predictable amount of time  Frequency and Duration min 1 x/week  1 week       Prognosis Prognosis for Safe Diet Advancement: Fair Barriers to Reach Goals: Cognitive deficits      Swallow Study   General Date of Onset: 04/11/22 HPI: Patient is a 81 year old female with past medical history of DVT on Coumadin, hypertension, hyperlipidemia, stroke with right-sided residual weakness, GERD who presents from skilled nursing facility for further evaluation of left-sided weakness.  She was noted to have left-sided weakness, difficulty speaking at nursing facility.  Patient is wheelchair-bound at baseline.  On admission ,she was mildly hypertensive.   CT angio head and neck showed a new focal occlusion or high-grade stenosis of the left M2 segment, severe chronic microvascular ischemic disease, chronic infarcts, suspicious for acute infarct in the left insular cortex.  MRI pending. EEG impression : mild to moderate diffuse encephalopathy, nonspecific etiology. No seizures or epileptiform discharges were seen throughout the recording." Per MD notes, palliative consult requested, pt DNR with poor quality of life. Type of Study: Bedside Swallow Evaluation Previous Swallow Assessment: none on file Diet Prior to this Study: NPO Temperature Spikes Noted: No Respiratory Status: Room air History of Recent Intubation: No Behavior/Cognition: Alert;Doesn't follow directions Oral Cavity Assessment: Dry Oral Care Completed by SLP: Yes Oral Cavity - Dentition: Adequate natural dentition Vision: Functional for self-feeding Self-Feeding Abilities: Total assist Patient Positioning: Upright in bed Baseline Vocal Quality: Normal Volitional Cough: Cognitively unable to elicit Volitional Swallow: Unable to elicit    Oral/Motor/Sensory Function Overall Oral Motor/Sensory Function: Other (comment) (appeared grossly functional during clinical  tasks but pt unable to follow commands for formal oral motor exam)   Ice Chips Ice chips: Impaired Presentation: Spoon Oral Phase Impairments: Other (comment) (did not accept tsp of ice chips)   Thin Liquid Thin Liquid: Impaired Presentation: Straw Oral Phase Functional Implications: Prolonged oral transit Pharyngeal  Phase Impairments: Suspected delayed Swallow;Multiple swallows    Nectar Thick Nectar Thick Liquid: Not tested   Honey Thick Honey Thick Liquid: Not tested   Puree Puree: Impaired Presentation: Spoon Oral Phase Impairments: Reduced lingual movement/coordination Pharyngeal Phase Impairments: Suspected delayed Swallow   Solid     Solid: Impaired Presentation: Spoon Oral Phase Impairments: Reduced lingual movement/coordination Oral Phase Functional Implications: Prolonged oral transit Pharyngeal Phase Impairments: Suspected delayed Swallow;Multiple swallows      Myrlene Riera H. MA, CCC-SLP Acute Rehabilitation Services   04/12/2022,1:51 PM

## 2022-04-12 NOTE — Progress Notes (Addendum)
STROKE TEAM PROGRESS NOTE   ATTENDING NOTE: I reviewed above note and agree with the assessment and plan. Pt was seen and examined.   81 year old female with history of DVT on Coumadin, hypertension, hyperlipidemia, CKD, stroke admitted for right sided weakness and speech difficulty.  She had a stroke in 2016 without residual.  Had a stroke in 07/2019 with right hand weakness, MRI showed left BG infarct.  EF normal.  INR 2.0.  Carotid Doppler negative.  LDL 86.  A1c 5.6.  CT head and neck unremarkable.  Coumadin continued, aspirin changed to Plavix.  Later on, per Plavix change back to aspirin.  She has residual right-sided weakness.  On this admission, CT showed questionable left insular cortex infarct.  Old infarct bilateral caudate head, left BG, bilateral cerebellum and left CR.  CT head and neck showed high-grade stenosis left M2, right M1 moderate stenosis.  MRI pending, EF 65 to 70%.  LDL 44, A1c pending.  EEG no seizure.  Ammonia level negative.  Creatinine 0.91, hemoglobin 10.9, INR 2.9.  However, INR 3 weeks ago was 1.4.  On exam, patient awake, alert, eyes open, global aphasia and not following commands, able to make sounds out but not meaningful. No gaze palsy, tracking bilaterally, but not blinking to visual threat on the right but blinking on the left. Right facial droop. Tongue protrusion not cooperative. LUE spontaneous movement against gravity but RUE flaccid. Moving LLE at least 3/5 and RLE at least 2/5. Sensation, coordination not cooperative and gait not tested.   Patient symptoms concerning for left MCA stroke.  Currently INR 2.9, on aspirin 81.  Pending MRI, will make further recommendation regarding anticoagulation choices after MRI.  Continue home Crestor.  On diet, PT/OT pending.  Will follow  For detailed assessment and plan, please refer to above/below as I have made changes wherever appropriate.   Marvel Plan, MD PhD Stroke Neurology 04/12/2022 7:11 PM    INTERVAL  HISTORY Patient in bed seen in ED. No family at bedside. Patient is awake, but does not speak or answer questions. Does not follow commands, but does move all extremities. Speech garbled.   Vitals:   04/12/22 0427 04/12/22 0500 04/12/22 0530 04/12/22 0630  BP:  (!) 102/58 (!) 120/53 (!) 150/53  Pulse:  82 78 77  Resp:  16 (!) 23 16  Temp: 98.2 F (36.8 C)     TempSrc: Oral     SpO2:  95% 96% 96%  Weight:       CBC:  Recent Labs  Lab 04/11/22 1758 04/11/22 1809 04/11/22 2034 04/12/22 0315  WBC 8.3  --   --  8.5  NEUTROABS 4.3  --   --   --   HGB 10.4*   < > 10.5* 10.9*  HCT 35.6*   < > 31.0* 35.7*  MCV 90.1  --   --  88.4  PLT 199  --   --  201   < > = values in this interval not displayed.   Basic Metabolic Panel:  Recent Labs  Lab 04/11/22 1758 04/11/22 1809 04/11/22 2030 04/11/22 2034 04/12/22 0315  NA 138   < >  --  139 137  K 4.6   < >  --  3.8 3.8  CL 108   < >  --  105 107  CO2 24  --   --   --  25  GLUCOSE 99   < >  --  92 83  BUN 11   < >  --  11 11  CREATININE 0.91   < >  --  0.80 0.91  CALCIUM 9.2  --   --   --  9.3  MG  --   --  1.7  --  1.8  PHOS  --   --  2.7  --   --    < > = values in this interval not displayed.   Lipid Panel:  Recent Labs  Lab 04/12/22 0315  CHOL 141  TRIG 133  HDL 70  CHOLHDL 2.0  VLDL 27  LDLCALC 44   HgbA1c: No results for input(s): "HGBA1C" in the last 168 hours. Urine Drug Screen: No results for input(s): "LABOPIA", "COCAINSCRNUR", "LABBENZ", "AMPHETMU", "THCU", "LABBARB" in the last 168 hours.  Alcohol Level  Recent Labs  Lab 04/11/22 1758  ETH <10    IMAGING past 24 hours DG Chest Port 1 View  Result Date: 04/11/2022 CLINICAL DATA:  Altered mental status EXAM: PORTABLE CHEST 1 VIEW COMPARISON:  03/22/2022 FINDINGS: Lungs are symmetrically well expanded. 12 mm rim calcified density within the right mid lung zone appears new from prior examination and may be artifactual in nature, related to a confluence  of osseous and vascular shadows, given its relatively rapid development. No pneumothorax or pleural effusion. Cardiac size within normal limits. Retrocardiac density again noted likely reflecting the previously identified hiatal hernia. Pulmonary vascularity is normal. No acute bone abnormality. IMPRESSION: 1. No active disease. 2. Moderate hiatal hernia. 3. 12 mm rim calcified density within the right mid lung zone, favored to be artifactual. This could be confirmed with a standard two view chest radiograph once the patient's acute issues have resolved. Electronically Signed   By: Helyn NumbersAshesh  Parikh M.D.   On: 04/11/2022 20:54   CT HEAD CODE STROKE WO CONTRAST  Result Date: 04/11/2022 CLINICAL DATA:  Code stroke.  Left sided weakness. EXAM: CT ANGIOGRAPHY HEAD AND NECK TECHNIQUE: Multidetector CT imaging of the head and neck was performed using the standard protocol during bolus administration of intravenous contrast. Multiplanar CT image reconstructions and MIPs were obtained to evaluate the vascular anatomy. Carotid stenosis measurements (when applicable) are obtained utilizing NASCET criteria, using the distal internal carotid diameter as the denominator. RADIATION DOSE REDUCTION: This exam was performed according to the departmental dose-optimization program which includes automated exposure control, adjustment of the mA and/or kV according to patient size and/or use of iterative reconstruction technique. CONTRAST:  75mL OMNIPAQUE IOHEXOL 350 MG/ML SOLN COMPARISON:  CT Head 03/22/22, CTA head/neck 01/09/22 FINDINGS: CT HEAD FINDINGS Brain: Possible new region of poor gray white differentiation in the left insular cortex (series 3, image 18). 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 There are chronic infarcts in the bilateral caudate heads, left lentiform nucleus, bilateral cerebellar hemispheres, in the corona radiata on the left. Compared to prior exam there appears to be new age indeterminate  infarct in the left thalamus. No hemorrhage. No hydrocephalus. No extra-axial fluid collection. No hyperdense vessel. Vascular: No hyperdense vessel or unexpected calcification. Skull: Normal. Negative for fracture or focal lesion. Sinuses/Orbits: Bilateral lens replacement. Paranasal sinuses and mastoid air cells are clear. Other: None CTA NECK FINDINGS Aortic arch: Standard branching. Imaged portion shows no evidence of aneurysm or dissection. No significant stenosis of the major arch vessel origins. Right carotid system: Calcified atherosclerotic plaque at the carotid bifurcation. Mild narrowing of the carotid bifurcation. Left carotid system: Calcified atherosclerotic plaque at the bifurcation. Mild narrowing of the carotid bifurcation. Vertebral arteries: Right dominant system. Left vertebral artery  predominantly terminates in a PICA Skeleton: Negative. Other neck: Negative. Upper chest: Moderate emphysema. Review of the MIP images confirms the above findings CTA HEAD FINDINGS Anterior circulation: There is a new focal occlusion 2 high-grade stenosis of a left M2 segment (series 8, image 96). There is persistent moderate narrowing of the M1 segment of the right MCA without evidence of occlusion. There is mild narrowing of the A2 segment of the right ACA. Posterior circulation: No significant stenosis, proximal occlusion, aneurysm, or vascular malformation. Venous sinuses: As permitted by contrast timing, patent. Anatomic variants: None Review of the MIP images confirms the above findings IMPRESSION: 1. New focal occlusion or high-grade stenosis of left M2 segments. 2. Possible regions of loss of gray white differentiation in the left inulsar cortex, which could represent sites of acute infarct. 3. Unchanged moderate narrowing of the M1 segment of the right MCA. 4. 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 Chronic infarcts in the bilateral caudate heads, left lentiform nucleus, bilateral  cerebellar hemispheres, and in the corona radiata on the left. Findings were paged to Dr. Wilford Corner on 04/11/22 at 6:34 PM via [facility] paging system. Electronically Signed   By: Lorenza Cambridge M.D.   On: 04/11/2022 18:35   CT ANGIO HEAD NECK W WO CM (CODE STROKE)  Result Date: 04/11/2022 CLINICAL DATA:  Code stroke.  Left sided weakness. EXAM: CT ANGIOGRAPHY HEAD AND NECK TECHNIQUE: Multidetector CT imaging of the head and neck was performed using the standard protocol during bolus administration of intravenous contrast. Multiplanar CT image reconstructions and MIPs were obtained to evaluate the vascular anatomy. Carotid stenosis measurements (when applicable) are obtained utilizing NASCET criteria, using the distal internal carotid diameter as the denominator. RADIATION DOSE REDUCTION: This exam was performed according to the departmental dose-optimization program which includes automated exposure control, adjustment of the mA and/or kV according to patient size and/or use of iterative reconstruction technique. CONTRAST:  14mL OMNIPAQUE IOHEXOL 350 MG/ML SOLN COMPARISON:  CT Head 03/22/22, CTA head/neck 01/09/22 FINDINGS: CT HEAD FINDINGS Brain: Possible new region of poor gray white differentiation in the left insular cortex (series 3, image 18). 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 There are chronic infarcts in the bilateral caudate heads, left lentiform nucleus, bilateral cerebellar hemispheres, in the corona radiata on the left. Compared to prior exam there appears to be new age indeterminate infarct in the left thalamus. No hemorrhage. No hydrocephalus. No extra-axial fluid collection. No hyperdense vessel. Vascular: No hyperdense vessel or unexpected calcification. Skull: Normal. Negative for fracture or focal lesion. Sinuses/Orbits: Bilateral lens replacement. Paranasal sinuses and mastoid air cells are clear. Other: None CTA NECK FINDINGS Aortic arch: Standard branching. Imaged portion  shows no evidence of aneurysm or dissection. No significant stenosis of the major arch vessel origins. Right carotid system: Calcified atherosclerotic plaque at the carotid bifurcation. Mild narrowing of the carotid bifurcation. Left carotid system: Calcified atherosclerotic plaque at the bifurcation. Mild narrowing of the carotid bifurcation. Vertebral arteries: Right dominant system. Left vertebral artery predominantly terminates in a PICA Skeleton: Negative. Other neck: Negative. Upper chest: Moderate emphysema. Review of the MIP images confirms the above findings CTA HEAD FINDINGS Anterior circulation: There is a new focal occlusion 2 high-grade stenosis of a left M2 segment (series 8, image 96). There is persistent moderate narrowing of the M1 segment of the right MCA without evidence of occlusion. There is mild narrowing of the A2 segment of the right ACA. Posterior circulation: No significant stenosis, proximal occlusion, aneurysm, or vascular  malformation. Venous sinuses: As permitted by contrast timing, patent. Anatomic variants: None Review of the MIP images confirms the above findings IMPRESSION: 1. New focal occlusion or high-grade stenosis of left M2 segments. 2. Possible regions of loss of gray white differentiation in the left inulsar cortex, which could represent sites of acute infarct. 3. Unchanged moderate narrowing of the M1 segment of the right MCA. 4. 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 Chronic infarcts in the bilateral caudate heads, left lentiform nucleus, bilateral cerebellar hemispheres, and in the corona radiata on the left. Findings were paged to Dr. Wilford Corner on 04/11/22 at 6:34 PM via [facility] paging system. Electronically Signed   By: Lorenza Cambridge M.D.   On: 04/11/2022 18:35    PHYSICAL EXAM  Neuro - awake, alert, eyes open, global aphasia and not following commands, able to make sounds out but not meaningful. No gaze palsy, tracking bilaterally, but not blinking to  visual threat on the right but blinking on the left. Right facial droop. Tongue protrusion not cooperative. LUE spontaneous movement against gravity but RUE flaccid. Moving LLE at least 3/5 and RLE at least 2/5. Sensation, coordination not cooperative and gait not tested.      ASSESSMENT/PLAN Roberta Mercer is a 81 y.o. female with history of CKD, DVT on Coumadin, stroke, hypertension and hyperlipidemia who presents from her skilled nursing facility today with aphasia and left-sided weakness but later may be reporting right-sided weakness.   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  She is wheelchair-bound at baseline. EMS was given last known well of 1645 hrs. but they were concerned that the time was not accurate  Symptoms were identified at 1700 when staff came to give her her dinner tray, she was noted to have difficulty speaking as well as left-sided weakness. There was no family member at bedside.  No one could be reached over the phone to confirm the last known well. Patient was unable to provide any history at this time. She is on Coumadin for a DVT/PE.  She was at 1 point in Eliquis, failed Eliquis and was put back on Coumadin.  Per the EMS verbal report, medications are given regularly to the patient at that facility with last dose of Coumadin as advised and was given within the last 24 hours.  Stroke vsTIA:  MRI pending Etiology:   CT head 1. New focal occlusion or high-grade stenosis of left M2 segments. 2. Possible regions of loss of gray white differentiation in the left inulsar cortex, which could represent sites of acute infarct. 3. Unchanged moderate narrowing of the M1 segment of the right MCA.4. 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 Chronic infarcts in the bilateral caudate heads, left lentiform nucleus, bilateral cerebellar hemispheres, and in the corona radiata on the left. CTA head & neck 1. New focal occlusion or high-grade  stenosis of left M2 segments.2. Possible regions of loss of gray white differentiation in theleft inulsar cortex, which could represent sites of acute infarct.3. Unchanged moderate narrowing of the M1 segment of the right MCA.4. 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 Chronicinfarcts in the bilateral caudate heads, left lentiform nucleus,bilateral cerebellar hemispheres, and in the corona radiata on the left. MRI  pending 2D Echo EF 65-70% LDL 44 HgbA1c No results found for requested labs within last 1095 days. VTE prophylaxis - SCD EEG: This study is suggestive of mild to moderate diffuse encephalopathy, nonspecific etiology. No seizures or epileptiform discharges were seen throughout the recording.  Diet   Diet NPO time specified   warfarin daily prior to admission, now on aspirin 81 mg daily. Will discuss with family about changing to Eliquis again. Will wait for pending MRI.  Therapy recommendations:  pending Disposition:  pending  Hypertension Home meds:  amlodipine 5mg  daily, lisinopril 5mg  daily Stable Permissive hypertension (OK if < 220/120) but gradually normalize in 5-7 days Long-term BP goal normotensive  Hyperlipidemia Home meds:  crestor 20 mg daily, resumed in hospital LDL 44, goal < 70 Continue statin at discharge  Diabetes type II no diagnosis Home meds:  none HgbA1c No results found for requested labs within last 1095 days., goal < 7.0 CBGs Recent Labs    04/11/22 1801  GLUCAP 95     Other Stroke Risk Factors Advanced Age >/= 27  Obesity, Body mass index is 21.28 kg/m., BMI >/= 30 associated with increased stroke risk, recommend weight loss, diet and exercise as appropriate  Hx stroke (05/11/20) Family hx stroke (Mother, maternal grandmother)  Hospital day # 1  76, MSN, NP-C [facility] See AMION or use Epic Chat   To contact Stroke Continuity provider, please refer to 05/13/20. After hours, contact  General Neurology

## 2022-04-12 NOTE — ED Notes (Signed)
Patient being transported to vascular at this time.

## 2022-04-13 ENCOUNTER — Other Ambulatory Visit (HOSPITAL_COMMUNITY): Payer: Self-pay

## 2022-04-13 ENCOUNTER — Inpatient Hospital Stay (HOSPITAL_COMMUNITY): Payer: Medicare Other

## 2022-04-13 DIAGNOSIS — Z515 Encounter for palliative care: Secondary | ICD-10-CM | POA: Diagnosis not present

## 2022-04-13 DIAGNOSIS — Z7189 Other specified counseling: Secondary | ICD-10-CM | POA: Diagnosis not present

## 2022-04-13 DIAGNOSIS — I639 Cerebral infarction, unspecified: Secondary | ICD-10-CM | POA: Diagnosis not present

## 2022-04-13 LAB — BASIC METABOLIC PANEL
Anion gap: 4 — ABNORMAL LOW (ref 5–15)
BUN: 9 mg/dL (ref 8–23)
CO2: 17 mmol/L — ABNORMAL LOW (ref 22–32)
Calcium: 7.3 mg/dL — ABNORMAL LOW (ref 8.9–10.3)
Chloride: 119 mmol/L — ABNORMAL HIGH (ref 98–111)
Creatinine, Ser: 0.73 mg/dL (ref 0.44–1.00)
GFR, Estimated: >60 mL/min (ref >60)
Glucose, Bld: 65 mg/dL — ABNORMAL LOW (ref 70–99)
Potassium: 3.3 mmol/L — ABNORMAL LOW (ref 3.5–5.1)
Sodium: 140 mmol/L (ref 135–145)

## 2022-04-13 LAB — CBC
HCT: 32.7 % — ABNORMAL LOW (ref 36.0–46.0)
Hemoglobin: 9.7 g/dL — ABNORMAL LOW (ref 12.0–15.0)
MCH: 26.9 pg (ref 26.0–34.0)
MCHC: 29.7 g/dL — ABNORMAL LOW (ref 30.0–36.0)
MCV: 90.8 fL (ref 80.0–100.0)
Platelets: 134 10*3/uL — ABNORMAL LOW (ref 150–400)
RBC: 3.6 MIL/uL — ABNORMAL LOW (ref 3.87–5.11)
RDW: 17.9 % — ABNORMAL HIGH (ref 11.5–15.5)
WBC: 5 10*3/uL (ref 4.0–10.5)
nRBC: 0 % (ref 0.0–0.2)

## 2022-04-13 LAB — PROTIME-INR
INR: 1.9 — ABNORMAL HIGH (ref 0.8–1.2)
Prothrombin Time: 21.9 seconds — ABNORMAL HIGH (ref 11.4–15.2)

## 2022-04-13 LAB — HEMOGLOBIN A1C
Hgb A1c MFr Bld: 5.1 % (ref 4.8–5.6)
Mean Plasma Glucose: 100 mg/dL

## 2022-04-13 MED ORDER — POTASSIUM CHLORIDE 10 MEQ/100ML IV SOLN
10.0000 meq | INTRAVENOUS | Status: AC
  Administered 2022-04-13 (×4): 10 meq via INTRAVENOUS
  Filled 2022-04-13 (×3): qty 100

## 2022-04-13 MED ORDER — APIXABAN 5 MG PO TABS
5.0000 mg | ORAL_TABLET | Freq: Two times a day (BID) | ORAL | Status: DC
  Administered 2022-04-14 – 2022-04-19 (×7): 5 mg via ORAL
  Filled 2022-04-13 (×11): qty 1

## 2022-04-13 MED ORDER — POTASSIUM CHLORIDE CRYS ER 20 MEQ PO TBCR: 40.0000 meq | EXTENDED_RELEASE_TABLET | Freq: Once | ORAL | Status: DC

## 2022-04-13 NOTE — TOC Initial Note (Signed)
Transition of Care [facility]) - Initial/Assessment Note    Patient Details  Name: Roberta Mercer MRN: [id number] Date of Birth: [birth year]  Transition of Care [facility]) CM/SW Contact:    Jinger Neighbors, LCSW Phone Number: 04/13/2022, 3:07 PM  Clinical Narrative:                  CSW met with pt and natural support, Almyra Free, at bedside. Pt non verbal at the time CSW attempted to speak with her, but awake and looking around the room. Almyra Free asked pt a question and she attempted to answer, and spoke with difficulty and did not make any sense. CSW was provided information to pt's attorney, Susa Raring, [phone]; CSW attempted to call and unable to make contact. Almyra Free reports their office is closed this week. CSW called Ruthann Cancer, pt's cousin and left a voicemail CSW received a returned call and Ruthann Cancer reports he is no longer the POA. CSW informed him CSW unable to connect with Nicki Reaper and if a decision needs to be made, we would reach out to him since he is next of kin. Ruthann Cancer stated he does not want to be contacted and he withdrew his POA for a reason. CSW informed him it will be documented in the system not to call him and he stated, "good". CSW attempted to call Amy, who oversees pt's medical appointments and also an Therapist, sports, per Almyra Free; CSW left a voicemail.   Expected Discharge Plan: Long Term Nursing Home Barriers to Discharge: Continued Medical Work up   Patient Goals and CMS Choice            Expected Discharge Plan and Services In-house Referral: Hospice / [place]                                            Prior Living Arrangements/Services   Lives with:: Facility Resident Patient language and need for interpreter reviewed:: Yes Do you feel safe going back to the place where you live?: Yes      Need for Family Participation in Patient Care: Yes (Comment) Care giver support system in place?: Yes (comment)   Criminal Activity/Legal Involvement Pertinent to Current  Situation/Hospitalization: Yes - Comment as needed  Activities of Daily Living      Permission Sought/Granted                  Emotional Assessment Appearance:: Appears stated age Attitude/Demeanor/Rapport: Unable to Assess Affect (typically observed): Unable to Assess        Admission diagnosis:  Acute ischemic stroke [facility]) [I63.9] Severe comorbid illness [R69] Cerebrovascular accident (CVA), unspecified mechanism ([place]) [I63.9] Patient Active Problem List   Diagnosis Date Noted   Iron deficiency anemia 04/12/2022   Hypoalbuminemia due to protein-calorie malnutrition ([place]) 04/12/2022   GERD (gastroesophageal reflux disease) 04/12/2022   Acute ischemic stroke ([place]) 04/11/2022   Moderate aortic stenosis 01/06/2021   SOB (shortness of breath) 01/06/2021   Moderate aortic regurgitation 01/06/2021   History of CVA (cerebrovascular accident) 05/11/2020   CKD (chronic kidney disease)    DVT (deep venous thrombosis) (HCC)    Stroke (HCC)    TIA (transient ischemic attack)    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   Nonrheumatic aortic valve stenosis 06/05/2019   LVH (left ventricular hypertrophy) 51/76/1607   Diastolic dysfunction 37/01/6268   Stage 3a chronic kidney disease ([place]) 06/05/2019   Nonrheumatic aortic  valve insufficiency 05/05/2019   Essential hypertension 03/03/2019   Post-menopausal atrophic vaginitis    Mixed hyperlipidemia    History of venous thromboembolism    Essential tremor    Esophageal spasm    History of stroke 11/14/2014   PCP:  Mateo Flow, MD Pharmacy:   [place], [place] [address] [place]  [address] Phone: [phone] Fax: [phone]     Social Determinants of Health (SDOH) Social History: SDOH Screenings   Tobacco Use: Medium Risk (04/12/2022)   SDOH Interventions:     Readmission Risk Interventions     No data to display

## 2022-04-13 NOTE — Progress Notes (Signed)
PROGRESS NOTE  Roberta Mercer  WJX:[id number] DOB: 1940-07-31 DOA: 04/11/2022 PCP: Lise Auer, MD   Brief Narrative: Patient is a 81 year old female with past medical history of DVT on Coumadin, hypertension, hyperlipidemia, stroke with right-sided residual weakness, GERD who presents from skilled nursing facility for further evaluation of left-sided weakness.  She was noted to have left-sided weakness, difficulty speaking at nursing facility.  Patient is wheelchair-bound at baseline.  On admission ,she was mildly hypertensive.   CT angio head and neck showed a new focal occlusion or high-grade stenosis of the left M2 segment, severe chronic microvascular ischemic disease, chronic infarcts, suspicious for acute infarct in the left insular cortex.  MRI confirmed stroke.  Neurology consulted and following.Remains confused this mrng  Assessment & Plan:  Principal Problem:   Acute ischemic stroke [facility]) Active Problems:   Mixed hyperlipidemia   Essential hypertension   DVT (deep venous thrombosis) (HCC)   Iron deficiency anemia   Hypoalbuminemia due to protein-calorie malnutrition (HCC)   GERD (gastroesophageal reflux disease)  Acute ischemic stroke: Presented from nursing facility with left-sided weakness, slurred speech.  History of prior stroke with right-sided weakness.  CT imaging suspicious for acute infarct.  MRI showed moderate-sized early subacute infarct in the left MCA distribution without hemorrhage or mass effect.. Neurology following. Continue statin, aspirin.  Stroke workup initiated.  LDL of 44, A1c of 5.1.  PT/speech/OT evaluation pending.  Altered mental status: Most likely from acute metabolic encephalopathy from stroke.  EEG showed mild to moderate diffuse encephalopathy, nonspecific allergy, no seizures or epileptiform discharges.  Monitor mentation.  Delirium precautions.  As per her family friend Aletha Halim, she is most of the time confused at baseline.On dsyphagia 3  diet  Hypertension: Currently antihypertensives on hold for allowing permissive hypertension.  Continue as needed meds for severe hypertension.  Monitor blood pressure  History of DVT: On warfarin at home, currently on hold.  Monitor INR. Pending today. Resume warfarin if MRI confirms ischemic stroke/neurology clearance   Iron deficient anemia: Continue iron supplementation  GERD: Continue Protonix  Debility/deconditioning: History of prior stroke.  Wheelchair-bound.  Confused at baseline.Lives at nursing facility.  Now with new stroke.  Very poor quality of life.  We we will request palliative care consultation regarding goals of care discussion.  CODE STATUS is DNR.         DVT prophylaxis:SCD's Start: 04/12/22 2130     Code Status: DNR  Family Communication: Called and discussed with friend Aletha Halim on phone on 12/27  Patient status:Inpatient  Patient is from :SNF  Anticipated discharge to:SNF?  Estimated DC date:after completion of stroke work up, palliative care evaluation   Consultants: Neurology  Procedures:None  Antimicrobials:  Anti-infectives (From admission, onward)    None       Subjective: Patient seen and examined at bedside today.  She just came from MRI.  She was given Ativan for MRI.  She was completely sedated, lying in bed.  Did not follow any commands.  As per the RN, she was alert and awake this morning but was confused  Objective: Vitals:   04/13/22 0745 04/13/22 0932 04/13/22 1000 04/13/22 1057  BP:  (!) 169/57 (!) 134/43 (!) 143/48  Pulse: 66 67  78  Resp: Temp:  99.1 F (37.3 C)  98.7 F (37.1 C)  TempSrc:  Axillary  Oral  SpO2: 100% 100%  99%  Weight:      Height:  No intake or output data in the 24 hours ending 04/13/22 1243 Filed Weights   04/11/22 1800 04/12/22 1253  Weight: 59.8 kg 60 kg    Examination:  General exam:  not in distress, drowsy/sleepy HEENT: Eyes closed Respiratory system:  no  wheezes or crackles  Cardiovascular system: S1 & S2 heard, RRR.  Pansystolic murmur heard Gastrointestinal system: Abdomen is nondistended, soft and nontender. Central nervous system: Not alert or oriented.  Does not follow commands Extremities: No edema, no clubbing ,no cyanosis Skin: No rashes, no ulcers,no icterus      Data Reviewed: I have personally reviewed following labs and imaging studies  CBC: Recent Labs  Lab 04/11/22 1758 04/11/22 1809 04/11/22 2034 04/12/22 0315 04/13/22 0535  WBC 8.3  --   --  8.5 5.0  NEUTROABS 4.3  --   --   --   --   HGB 10.4* 11.6* 10.5* 10.9* 9.7*  HCT 35.6* 34.0* 31.0* 35.7* 32.7*  MCV 90.1  --   --  88.4 90.8  PLT 199  --   --  201 134*   Basic Metabolic Panel: Recent Labs  Lab 04/11/22 1758 04/11/22 1809 04/11/22 2030 04/11/22 2034 04/12/22 0315 04/13/22 0435  NA 138 139  --  139 137 140  K 4.6 4.5  --  3.8 3.8 3.3*  CL 108 107  --  105 107 119*  CO2 24  --   --   --  25 17*  GLUCOSE 99 97  --  92 83 65*  BUN 11 14  --  11 11 9   CREATININE 0.91 0.90  --  0.80 0.91 0.73  CALCIUM 9.2  --   --   --  9.3 7.3*  MG  --   --  1.7  --  1.8  --   PHOS  --   --  2.7  --   --   --      Recent Results (from the past 240 hour(s))  Urine Culture     Status: Abnormal   Collection Time: 04/11/22  8:16 PM   Specimen: Urine, Catheterized  Result Value Ref Range Status   Specimen Description URINE, CATHETERIZED  Final   Special Requests   Final    NONE Performed at [facility], [address] [place], [place] [address]    Culture MULTIPLE SPECIES PRESENT, SUGGEST RECOLLECTION (A)  Final   Report Status 04/12/2022 FINAL  Final     Radiology Studies: MR BRAIN WO CONTRAST  Result Date: 04/13/2022 CLINICAL DATA:  History of prior stroke with right-sided weakness, presenting with left-sided weakness. EXAM: MRI HEAD WITHOUT CONTRAST TECHNIQUE: Multiplanar, multiecho pulse sequences of the brain and surrounding structures were  obtained without intravenous contrast. COMPARISON:  CT/CTA head and neck 2 days prior FINDINGS: Brain: There is moderate-sized area of diffusion restriction with associated T2/FLAIR signal abnormality involving the posterior aspect of the left insula extending to the temporal and parietal lobe consistent with early subacute infarct. There is no associated hemorrhage or mass effect. SWI signal dropout within a vessel in this region likely reflects thrombus (7-54). There is no acute intracranial hemorrhage or extra-axial fluid collection. Background parenchymal volume is within expected limits for age. The ventricles are normal in size. There is mild background chronic small vessel ischemic change. There are small remote infarcts in the left occipital cortex, bilateral basal ganglia, and bilateral cerebellar hemispheres. There are a few scattered punctate chronic microhemorrhages, nonspecific. There is no mass  lesion.  There is no mass effect or midline shift. Vascular: The major flow voids are preserved. Suspected thrombus within a left MCA branch as above. Skull and upper cervical spine: Normal marrow signal. Sinuses/Orbits: The paranasal sinuses are clear. Bilateral lens implants are in place. The globes and orbits are otherwise unremarkable. Other: None. IMPRESSION: Moderate-sized early subacute infarct in the left MCA distribution without hemorrhage or mass effect. Electronically Signed   By: Lesia Hausen M.D.   On: 04/13/2022 10:50   ECHOCARDIOGRAM LIMITED  Result Date: 04/12/2022    ECHOCARDIOGRAM LIMITED REPORT   Patient Name:   MARLIN BRYS Date of Exam: 04/12/2022 Medical Rec #:  [id number]     Height:       66.0 in Accession #:    [id number]    Weight:       131.8 lb Date of Birth:  Feb 27, 1941     BSA:          1.675 m Patient Age:    81 years      BP:           128/67 mmHg Patient Gender: F             HR:           79 bpm. Exam Location:  Inpatient Procedure: Limited Echo, Cardiac Doppler and Limited  Color Doppler Indications:    Stroke I63.9  History:        Patient has prior history of Echocardiogram examinations, most                 recent 01/05/2022. LVH, Stroke, Aortic Valve Disease; Risk                 Factors:Hypertension, Dyslipidemia and Former Smoker.  Sonographer:    Aron Baba Referring Phys: [id number] OLADAPO ADEFESO  Sonographer Comments: Image acquisition challenging due to patient body habitus. IMPRESSIONS  1. Left ventricular ejection fraction, by estimation, is 65 to 70%. The left ventricle has normal function. The left ventricle has no regional wall motion abnormalities. There is severe concentric left ventricular hypertrophy. Left ventricular diastolic  parameters are consistent with Grade I diastolic dysfunction (impaired relaxation).  2. Right ventricular systolic function is normal. The right ventricular size is normal.  3. A small pericardial effusion is present. The pericardial effusion is circumferential. There is no evidence of cardiac tamponade.  4. The mitral valve is grossly normal. Trivial mitral valve regurgitation.  5. Aortic valve regurgitation is mild to moderate. Comparison(s): Limited echo. Compared to prior, there is no significant change based on images obtained. EF remains normal. Continues to have mild-to-moderate AR. FINDINGS  Left Ventricle: Left ventricular ejection fraction, by estimation, is 65 to 70%. The left ventricle has normal function. The left ventricle has no regional wall motion abnormalities. The left ventricular internal cavity size was normal in size. There is  severe concentric left ventricular hypertrophy. Left ventricular diastolic parameters are consistent with Grade I diastolic dysfunction (impaired relaxation). Right Ventricle: The right ventricular size is normal. No increase in right ventricular wall thickness. Right ventricular systolic function is normal. Pericardium: A small pericardial effusion is present. The pericardial effusion is  circumferential. There is no evidence of cardiac tamponade. Mitral Valve: The mitral valve is grossly normal. There is mild thickening of the mitral valve leaflet(s). Trivial mitral valve regurgitation. Tricuspid Valve: The tricuspid valve is normal in structure. Tricuspid valve regurgitation is mild. Aortic Valve: Aortic valve regurgitation is mild to moderate. Additional Comments: Spectral  Doppler performed. Color Doppler performed.  LEFT VENTRICLE PLAX 2D LVIDd:         3.60 cm LVIDs:         2.30 cm LV PW:         1.90 cm LV IVS:        1.20 cm  LEFT ATRIUM         Index LA diam:    3.50 cm 2.09 cm/m  MITRAL VALVE               TRICUSPID VALVE MV Area (PHT): 2.22 cm    TR Peak grad:   25.4 mmHg MV Decel Time: 342 msec    TR Vmax:        252.00 cm/s MV E velocity: 67.30 cm/s MV A velocity: 88.10 cm/s MV E/A ratio:  0.76 Laurance Flatten MD Electronically signed by Laurance Flatten MD Signature Date/Time: 04/12/2022/12:07:57 PM    Final    EEG adult  Result Date: 04/12/2022 Charlsie Quest, MD     04/12/2022  8:28 AM Patient Name: Elissa Grieshop MRN: [id number] Epilepsy Attending: Charlsie Quest Referring Physician/Provider: Marjorie Smolder, NP Date: 04/11/2022 Duration: 21.52 mins Patient history:  81 year old patient with history of stroke with residual right-sided weakness, hypertension, hyperlipidemia, DVT on Coumadin and CKD presented from skilled nursing facility with aphasia and initial report of left-sided weakness, and later possibly change to right-sided weakness.  EEG to evaluate for seizure. Level of alertness: Awake AEDs during EEG study: None Technical aspects: This EEG study was done with scalp electrodes positioned according to the 10-20 International system of electrode placement. Electrical activity was reviewed with band pass filter of 1-70Hz , sensitivity of 7 uV/mm, display speed of 22mm/sec with a 60Hz  notched filter applied as appropriate. EEG data were recorded continuously  and digitally stored.  Video monitoring was available and reviewed as appropriate. Description: No clear posterior dominant rhythm was seen.  EEG showed continuous generalized predominantly 5 to 9 Hz theta-alpha activity admixed with intermittent generalized 2 to 3 Hz delta slowing. Hyperventilation and photic stimulation were not performed.   ABNORMALITY - Continuous slow, generalized IMPRESSION: This study is suggestive of mild to moderate diffuse encephalopathy, nonspecific etiology. No seizures or epileptiform discharges were seen throughout the recording.   DG Chest Port 1 View  Result Date: 04/11/2022 CLINICAL DATA:  Altered mental status EXAM: PORTABLE CHEST 1 VIEW COMPARISON:  03/22/2022 FINDINGS: Lungs are symmetrically well expanded. 12 mm rim calcified density within the right mid lung zone appears new from prior examination and may be artifactual in nature, related to a confluence of osseous and vascular shadows, given its relatively rapid development. No pneumothorax or pleural effusion. Cardiac size within normal limits. Retrocardiac density again noted likely reflecting the previously identified hiatal hernia. Pulmonary vascularity is normal. No acute bone abnormality. IMPRESSION: 1. No active disease. 2. Moderate hiatal hernia. 3. 12 mm rim calcified density within the right mid lung zone, favored to be artifactual. This could be confirmed with a standard two view chest radiograph once the patient's acute issues have resolved. Electronically Signed   By: 14/09/2021 M.D.   On: 04/11/2022 20:54   CT HEAD CODE STROKE WO CONTRAST  Result Date: 04/11/2022 CLINICAL DATA:  Code stroke.  Left sided weakness. EXAM: CT ANGIOGRAPHY HEAD AND NECK TECHNIQUE: Multidetector CT imaging of the head and neck was performed using the standard protocol during bolus administration of intravenous contrast. Multiplanar CT image reconstructions  and MIPs were obtained to evaluate the vascular  anatomy. Carotid stenosis measurements (when applicable) are obtained utilizing NASCET criteria, using the distal internal carotid diameter as the denominator. RADIATION DOSE REDUCTION: This exam was performed according to the departmental dose-optimization program which includes automated exposure control, adjustment of the mA and/or kV according to patient size and/or use of iterative reconstruction technique. CONTRAST:  27mL OMNIPAQUE IOHEXOL 350 MG/ML SOLN COMPARISON:  CT Head 03/22/22, CTA head/neck 01/09/22 FINDINGS: CT HEAD FINDINGS Brain: Possible new region of poor gray white differentiation in the left insular cortex (series 3, image 18). 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 There are chronic infarcts in the bilateral caudate heads, left lentiform nucleus, bilateral cerebellar hemispheres, in the corona radiata on the left. Compared to prior exam there appears to be new age indeterminate infarct in the left thalamus. No hemorrhage. No hydrocephalus. No extra-axial fluid collection. No hyperdense vessel. Vascular: No hyperdense vessel or unexpected calcification. Skull: Normal. Negative for fracture or focal lesion. Sinuses/Orbits: Bilateral lens replacement. Paranasal sinuses and mastoid air cells are clear. Other: None CTA NECK FINDINGS Aortic arch: Standard branching. Imaged portion shows no evidence of aneurysm or dissection. No significant stenosis of the major arch vessel origins. Right carotid system: Calcified atherosclerotic plaque at the carotid bifurcation. Mild narrowing of the carotid bifurcation. Left carotid system: Calcified atherosclerotic plaque at the bifurcation. Mild narrowing of the carotid bifurcation. Vertebral arteries: Right dominant system. Left vertebral artery predominantly terminates in a PICA Skeleton: Negative. Other neck: Negative. Upper chest: Moderate emphysema. Review of the MIP images confirms the above findings CTA HEAD FINDINGS Anterior circulation:  There is a new focal occlusion 2 high-grade stenosis of a left M2 segment (series 8, image 96). There is persistent moderate narrowing of the M1 segment of the right MCA without evidence of occlusion. There is mild narrowing of the A2 segment of the right ACA. Posterior circulation: No significant stenosis, proximal occlusion, aneurysm, or vascular malformation. Venous sinuses: As permitted by contrast timing, patent. Anatomic variants: None Review of the MIP images confirms the above findings IMPRESSION: 1. New focal occlusion or high-grade stenosis of left M2 segments. 2. Possible regions of loss of gray white differentiation in the left inulsar cortex, which could represent sites of acute infarct. 3. Unchanged moderate narrowing of the M1 segment of the right MCA. 4. 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 Chronic infarcts in the bilateral caudate heads, left lentiform nucleus, bilateral cerebellar hemispheres, and in the corona radiata on the left. Findings were paged to Dr. Wilford Corner on 04/11/22 at 6:34 PM via [facility] paging system. Electronically Signed   By: Lorenza Cambridge M.D.   On: 04/11/2022 18:35   CT ANGIO HEAD NECK W WO CM (CODE STROKE)  Result Date: 04/11/2022 CLINICAL DATA:  Code stroke.  Left sided weakness. EXAM: CT ANGIOGRAPHY HEAD AND NECK TECHNIQUE: Multidetector CT imaging of the head and neck was performed using the standard protocol during bolus administration of intravenous contrast. Multiplanar CT image reconstructions and MIPs were obtained to evaluate the vascular anatomy. Carotid stenosis measurements (when applicable) are obtained utilizing NASCET criteria, using the distal internal carotid diameter as the denominator. RADIATION DOSE REDUCTION: This exam was performed according to the departmental dose-optimization program which includes automated exposure control, adjustment of the mA and/or kV according to patient size and/or use of iterative reconstruction technique.  CONTRAST:  62mL OMNIPAQUE IOHEXOL 350 MG/ML SOLN COMPARISON:  CT Head 03/22/22, CTA head/neck 01/09/22 FINDINGS: CT HEAD FINDINGS Brain: Possible  new region of poor gray white differentiation in the left insular cortex (series 3, image 18). 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 There are chronic infarcts in the bilateral caudate heads, left lentiform nucleus, bilateral cerebellar hemispheres, in the corona radiata on the left. Compared to prior exam there appears to be new age indeterminate infarct in the left thalamus. No hemorrhage. No hydrocephalus. No extra-axial fluid collection. No hyperdense vessel. Vascular: No hyperdense vessel or unexpected calcification. Skull: Normal. Negative for fracture or focal lesion. Sinuses/Orbits: Bilateral lens replacement. Paranasal sinuses and mastoid air cells are clear. Other: None CTA NECK FINDINGS Aortic arch: Standard branching. Imaged portion shows no evidence of aneurysm or dissection. No significant stenosis of the major arch vessel origins. Right carotid system: Calcified atherosclerotic plaque at the carotid bifurcation. Mild narrowing of the carotid bifurcation. Left carotid system: Calcified atherosclerotic plaque at the bifurcation. Mild narrowing of the carotid bifurcation. Vertebral arteries: Right dominant system. Left vertebral artery predominantly terminates in a PICA Skeleton: Negative. Other neck: Negative. Upper chest: Moderate emphysema. Review of the MIP images confirms the above findings CTA HEAD FINDINGS Anterior circulation: There is a new focal occlusion 2 high-grade stenosis of a left M2 segment (series 8, image 96). There is persistent moderate narrowing of the M1 segment of the right MCA without evidence of occlusion. There is mild narrowing of the A2 segment of the right ACA. Posterior circulation: No significant stenosis, proximal occlusion, aneurysm, or vascular malformation. Venous sinuses: As permitted by contrast timing,  patent. Anatomic variants: None Review of the MIP images confirms the above findings IMPRESSION: 1. New focal occlusion or high-grade stenosis of left M2 segments. 2. Possible regions of loss of gray white differentiation in the left inulsar cortex, which could represent sites of acute infarct. 3. Unchanged moderate narrowing of the M1 segment of the right MCA. 4. 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 Chronic infarcts in the bilateral caudate heads, left lentiform nucleus, bilateral cerebellar hemispheres, and in the corona radiata on the left. Findings were paged to Dr. Wilford Corner on 04/11/22 at 6:34 PM via [facility] paging system. Electronically Signed   By: Lorenza Cambridge M.D.   On: 04/11/2022 18:35    Scheduled Meds:   stroke: early stages of recovery book   Does not apply Once   aspirin EC  81 mg Oral Daily   feeding supplement  237 mL Oral BID BM   ferrous sulfate  325 mg Oral QODAY   LORazepam  1 mg Intravenous Once   pantoprazole  40 mg Oral BID   potassium chloride  40 mEq Oral Once   rosuvastatin  20 mg Oral QHS   Continuous Infusions:   LOS: 2 days   Burnadette Pop, MD [facility] P12/28/2023, 12:43 PM

## 2022-04-13 NOTE — Progress Notes (Signed)
   Palliative Medicine Inpatient Follow Up Note   The Palliative Care team has acknowledged the consultation for Roberta Mercer.   A HIPAA compliant VM has been left for patients friend, Roberta Mercer. Presently we are awaiting a return call to complete the initial consultation.   In the meanwhile have requested TOC help to identify alterative decision makers.  No Charge ______________________________________________________________________________________ Roberta Mercer Palliative Medicine Team Team Cell Phone: [phone] Please utilize secure chat with additional questions, if there is no response within 30 minutes please call the above phone number  Palliative Medicine Team providers are available by phone from 7am to 7pm daily and can be reached through the team cell phone.  Should this patient require assistance outside of these hours, please call the patient's attending physician.

## 2022-04-13 NOTE — Progress Notes (Signed)
PT Cancellation Note  Patient Details Name: Roberta Mercer MRN: [id number] DOB: Dec 06, 1940   Cancelled Treatment:    Reason Eval/Treat Not Completed: Medical issues which prohibited therapy (Pt given Ativan for MRI and was too lethargic for PT.  Will return as able.)   Bevelyn Buckles 04/13/2022, 10:59 AM Tatisha Cerino M,PT Acute Rehab Services [phone]

## 2022-04-13 NOTE — Progress Notes (Addendum)
ANTICOAGULATION CONSULT NOTE - Initial Consult  Pharmacy Consult for Eliquis Indication:  history of DVT and prior stroke - on warfarin PTA  Allergies  Allergen Reactions   Bactrim [Sulfamethoxazole-Trimethoprim] Other (See Comments)    Unknown reaction    Diovan [Valsartan] Other (See Comments)    Unknown reaction   Sulfa Antibiotics Other (See Comments)    Listed on MAR Unknown reaction    Patient Measurements: Height: 5\' 6"  (167.6 cm) Weight: 60 kg (132 lb 4.4 oz) IBW/kg (Calculated) : 59.3  Vital Signs: Temp: 98.7 F (37.1 C) (12/28 1057) Temp Source: Oral (12/28 1057) BP: 143/48 (12/28 1057) Pulse Rate: 78 (12/28 1057)  Labs: Recent Labs    04/11/22 1758 04/11/22 1809 04/11/22 2034 04/12/22 0315 04/13/22 0435 04/13/22 0535 04/13/22 1243  HGB 10.4*   < > 10.5* 10.9*  --  9.7*  --   HCT 35.6*   < > 31.0* 35.7*  --  32.7*  --   PLT 199  --   --  201  --  134*  --   APTT 71*  --   --   --   --   --   --   LABPROT 30.4*  --   --   --   --   --  21.9*  INR 2.9*  --   --   --   --   --  1.9*  CREATININE 0.91   < > 0.80 0.91 0.73  --   --    < > = values in this interval not displayed.    Estimated Creatinine Clearance: 52.5 mL/min (by C-G formula based on SCr of 0.73 mg/dL).   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 Pertinent PMH includes DVT (on warfarin PTA- last dose 12/25) and prior stroke. INR 2.9 on admit >> down to 1.9 today. MRI negative for hemorrhage or mass effect.   Patient previously on Eliquis but was switched to warfarin given stroke while on Eliquis. Per discussion b/w neuro team and patient's care giver and will trial Eliquis again. Ok to transition to Eliquis starting 12/29 per neurology recommendations.   Goal of Therapy:  Monitor platelets by anticoagulation protocol: Yes   Plan:  Eliquis 5mg  PO BID to begin 12/29 AM D/c ASA on 12/29 with initiation of Eliquis   Pharmacy to provide education prior to discharge.   1/30,  PharmD, BCPS 04/13/2022 2:06 PM

## 2022-04-13 NOTE — Consult Note (Signed)
Palliative Medicine Inpatient Consult Note  Consulting Provider:  Shelly Coss, MD   Reason for consult:   Roberta Mercer Palliative Medicine Consult  Reason for Consult? goals fo care. H/O stroke, confused at baseline. Came with new stroke. Needs to discuss goals fo care   04/13/2022  HPI:  Per intake H&P --> Patient is a 81 year old female with past medical history of DVT on Coumadin, hypertension, hyperlipidemia, stroke with right-sided residual weakness, GERD who presents from skilled nursing facility for further evaluation of left-sided weakness.  She was noted to have left-sided weakness, difficulty speaking at nursing facility.  Patient is wheelchair-bound at baseline.  On admission ,she was mildly hypertensive.   CT angio head and neck showed a new focal occlusion or high-grade stenosis of the left M2 segment, severe chronic microvascular ischemic disease, chronic infarcts, suspicious for acute infarct in the left insular cortex.  MRI confirmed stroke.  Neurology consulted and following.Remains confused this mrng.  Palliative care has been asked to get involved for further [place] conversations.   Clinical Assessment/Goals of Care:  *Please note that this is a verbal dictation therefore any spelling or grammatical errors are due to the "[place]" system interpretation.  I have reviewed medical records including EPIC notes, labs and imaging, received report from bedside RN, assessed the patient who is lying in bed disoriented does not appear to be in any distress.    I called patient's friend, Roberta Mercer to further discuss diagnosis prognosis, [place], EOL wishes, disposition and options.   I introduced Palliative Medicine as specialized medical care for people living with serious illness. It focuses on providing relief from the symptoms and stress of a serious illness. The goal is to improve quality of life for both the patient and the family.  Medical  History Review and Understanding:  Discussed patient's past medical history significant for hypertension, prior DVT, GERD, recent CVA in the end of September, and new acute CVA.  Social History:  Roberta Mercer is originally from [place].  She had been married though is divorced.  She has no children.  She has been living in [place] for almost 2 decades.  She does not have any family who live here though does have friends such as Roberta Mercer with whom she has a close relationship.  She is a woman of faith and practices within the [facility] denomination.  Functional and Nutritional State:  Patient was well-functioning until roughly the end of spring 2023.  It was then that she started having more motor and cognitive deficits.  She became less aware of her hygiene which was abnormal for her.  She had lived in a townhome and then transitioned at the end of June [facility] and most recently has been living at [facility] and rehabilitation.  Advance Directives:  A detailed discussion was had today regarding advanced directives.  At this time patient does not have a surrogate decision maker though her friend Roberta Mercer is the closest person to her locally.  Code Status:  Concepts specific to code status, artifical feeding and hydration, continued IV antibiotics and rehospitalization was had.  The difference between a aggressive medical intervention path  and a palliative comfort care path for this patient at this time was had.   Roberta Mercer is an established DO NOT RESUSCITATE DO NOT INTUBATE CODE STATUS.  Discussion:  In conversations with Roberta Mercer we reviewed best case and worst-case scenarios in the setting of an acute CVA.  Roberta Mercer  is an emergency room nurse therefore she understands patient's condition.  We reviewed goals for the future and Roberta Mercer is hopeful that Roberta Mercer will get to a point of functionality again.  Discussed allowing time for outcomes and if patient were to have an acute event or  to be declining more rapidly taking a more comfort oriented approach to care.  Discussed the importance of continued conversation with family and their  medical providers regarding overall plan of care and treatment options, ensuring decisions are within the context of the patients values and GOCs.  Decision Maker: Roberta Mercer (Friend): [phone] (Mobile)   SUMMARY OF RECOMMENDATIONS   DNAR/DNI  Allow time for outcomes  Appreciate physical therapy occupational therapy and speech therapy involvement  Open and honest conversations held in the setting of best case and worst-case scenarios  Ongoing palliative support  Code Status/Advance Care Planning: DNAR/DNI  Palliative Prophylaxis:  Aspiration, Bowel Regimen, Delirium Protocol, Frequent Pain Assessment, Oral Care, Palliative Wound Care, and Turn Reposition  Additional Recommendations (Limitations, Scope, Preferences): Continue current care  Psycho-social/Spiritual:  Desire for further Chaplaincy support: Yes-[employer] Additional Recommendations: Education on CVAs   Prognosis: 1 year mortality risk is high at this time in the setting of prior comorbidities as well as second stroke within 3 months  Discharge Planning: Discharge will likely be back to skilled nursing if medically optimized  Vitals:   04/13/22 1000 04/13/22 1057  BP: (!) 134/43 (!) 143/48  Pulse:  78  Resp: 17 19  Temp:  98.7 F (37.1 C)  SpO2:  99%   No intake or output data in the 24 hours ending 04/13/22 1332 Last Weight  Most recent update: 04/12/2022 12:53 PM    Weight  60 kg (132 lb 4.4 oz)            Gen: Frail elderly Caucasian female in no acute distress HEENT: moist mucous membranes CV: Regular rate and rhythm PULM: clear to auscultation bilaterally. No wheezes/rales/rhonchi ABD: soft/nontender/nondistended/normal bowel sounds EXT: No edema Neuro: Somnolent disoriented  PPS: 30%   This conversation/these recommendations were  discussed with patient primary care team, Dr. Renford Dills  Billing based on MDM: High  Problems Addressed: One acute or chronic illness or injury that poses a threat to life or bodily function  Amount and/or Complexity of Data: Category 3:Discussion of management or test interpretation with external physician/other qualified health care professional/appropriate source (not separately reported)  Risks: Decision regarding hospitalization or escalation of hospital care and Decision not to resuscitate or to de-escalate care because of poor prognosis ______________________________________________________ Lamarr Lulas [facility] Palliative Medicine Team Team Cell Phone: [phone] Please utilize secure chat with additional questions, if there is no response within 30 minutes please call the above phone number  Palliative Medicine Team providers are available by phone from 7am to 7pm daily and can be reached through the team cell phone.  Should this patient require assistance outside of these hours, please call the patient's attending physician.

## 2022-04-13 NOTE — Evaluation (Signed)
Occupational Therapy Evaluation Patient Details Name: Roberta Mercer MRN: [id number] DOB: [birth year] Today's Date: 04/13/2022   History of Present Illness 81 y.o. female adm 12/26 with medical history significant of DVT on Coumadin, hypertension, hyperlipidemia, stroke with right-sided residual weakness, GERD who presented from skilled nursing facility due to left-sided weakness.  MR Brain: early subacute infarct in the left MCA distribution.   Clinical Impression   Patient admitted for the diagnosis above.  Per the chart, she resides at a local SNF for long term care.  Prior level of function not available.  Poor cognition and initiation are deficits limiting a complete eval.  Currently she presents as total care for ADL and mobility.  OT will attempt a few sessions to determine basic self feeding abilities, but a return to her SNF for continue supportive care is recommended.        Recommendations for follow up therapy are one component of a multi-disciplinary discharge planning process, led by the attending physician.  Recommendations may be updated based on patient status, additional functional criteria and insurance authorization.   Follow Up Recommendations  Long-term institutional care without follow-up therapy     Assistance Recommended at Discharge Frequent or constant Supervision/Assistance  Patient can return home with the following A lot of help with bathing/dressing/bathroom;Direct supervision/assist for medications management    Functional Status Assessment  Patient has had a recent decline in their functional status and demonstrates the ability to make significant improvements in function in a reasonable and predictable amount of time.  Equipment Recommendations  None recommended by OT    Recommendations for Other Services       Precautions / Restrictions Precautions Precautions: Fall Restrictions Weight Bearing Restrictions: No      Mobility Bed Mobility Overal  bed mobility: Needs Assistance Bed Mobility: Supine to Sit, Sit to Supine     Supine to sit: Total assist Sit to supine: Total assist   General bed mobility comments: Actively resisting movement.    Transfers                   General transfer comment: Not safe      Balance Overall balance assessment: Needs assistance Sitting-balance support: Feet supported Sitting balance-Leahy Scale: Zero   Postural control: Left lateral lean                                 ADL either performed or assessed with clinical judgement   ADL                                         General ADL Comments: Currenlty Dep care for ADL and self feeding.     Vision   Vision Assessment?: Vision impaired- to be further tested in functional context Additional Comments: Occasional eye contact, but no purposeful tracking.     Perception     Praxis      Pertinent Vitals/Pain Pain Assessment Pain Assessment: Faces Faces Pain Scale: Hurts little more Pain Location: generalized with movement Pain Descriptors / Indicators: Grimacing Pain Intervention(s): Monitored during session     Hand Dominance     Extremity/Trunk Assessment Upper Extremity Assessment Upper Extremity Assessment: RUE deficits/detail;LUE deficits/detail RUE Deficits / Details: Patient did move R UE spontaneously, more elbow distal.  Was able to scratch her nose.  Withdrew to pain  RUE Coordination: decreased fine motor;decreased gross motor LUE Deficits / Details: Did squeeze OT's hand, and was resisting AROM at elbow and shoulder.  Did withdrawal to naibed squeeze LUE Coordination: decreased fine motor;decreased gross motor   Lower Extremity Assessment Lower Extremity Assessment: Defer to PT evaluation   Cervical / Trunk Assessment Cervical / Trunk Assessment: Kyphotic   Communication Communication Communication: Expressive difficulties   Cognition Arousal/Alertness:  Awake/alert Behavior During Therapy: Restless Overall Cognitive Status: History of cognitive impairments - at baseline                                 General Comments: Patient did squeeze OT's hand to command times one.  But did not follow any other commands.  No initiation of movements.     General Comments   VSS on RA    Exercises     Shoulder Instructions      Home Living Family/patient expects to be discharged to:: Skilled nursing facility                                        Prior Functioning/Environment Prior Level of Function : Needs assist  Cognitive Assist : Mobility (cognitive);ADLs (cognitive) Mobility (Cognitive): Step by step cues ADLs (Cognitive): Step by step cues Physical Assist : ADLs (physical);Mobility (physical) Mobility (physical): Bed mobility;Transfers ADLs (physical): Feeding;Grooming;Bathing;Dressing;Toileting            OT Problem List: Impaired balance (sitting and/or standing);Decreased range of motion;Decreased strength;Impaired UE functional use;Pain      OT Treatment/Interventions: Self-care/ADL training;Therapeutic activities;Balance training    OT Goals(Current goals can be found in the care plan section) Acute Rehab OT Goals Patient Stated Goal: None stated OT Goal Formulation: Patient unable to participate in goal setting Time For Goal Achievement: 04/27/22 Potential to Achieve Goals: Good ADL Goals Pt Will Perform Eating: with mod assist;sitting Additional ADL Goal #1: Patient will be Mod A for supine to sit to increase self feeding Ind  OT Frequency: Min 2X/week    Co-evaluation              AM-PAC OT "6 Clicks" Daily Activity     Outcome Measure Help from another person eating meals?: Total Help from another person taking care of personal grooming?: Total Help from another person toileting, which includes using toliet, bedpan, or urinal?: Total Help from another person bathing  (including washing, rinsing, drying)?: Total Help from another person to put on and taking off regular upper body clothing?: Total Help from another person to put on and taking off regular lower body clothing?: Total 6 Click Score: 6   End of Session Nurse Communication: Mobility status  Activity Tolerance: Other (comment) (Resisting attempts at movement) Patient left: in bed;with call bell/phone within reach;with bed alarm set;with nursing/sitter in room  OT Visit Diagnosis: Muscle weakness (generalized) (M62.81);Cognitive communication deficit (R41.841) Symptoms and signs involving cognitive functions: Cerebral infarction                Time: 3557-3220 OT Time Calculation (min): 17 min Charges:  OT General Charges $OT Visit: 1 Visit OT Evaluation $OT Eval Moderate Complexity: 1 Mod  04/13/2022  RP, OTR/L  Acute Rehabilitation Services  Office:  [phone]   Suzanna Obey 04/13/2022, 5:10 PM

## 2022-04-13 NOTE — Progress Notes (Addendum)
STROKE TEAM PROGRESS NOTE   INTERVAL HISTORY RN is at the bedside, also pt good friend's daughter. I talked with pt decision maker who is pt good friend's another daughter Amy. Pt still has aphasia and not following commands, mildly drowsy due to ativan for MRI, but easy to wake up. Talked with Amy and she stated that pt normally calculate her coumadin and manages it well. However, since she can not do it by herself now, Amy is agreeable to switch to eliquis. Pt was on eliquis before but suffered small stroke while on eliquis, so was switched back to coumadin.   Vitals:   04/13/22 0745 04/13/22 0932 04/13/22 1000 04/13/22 1057  BP:  (!) 169/57 (!) 134/43 (!) 143/48  Pulse: 66 67  78  Resp: 14 16 17 19   Temp:  99.1 F (37.3 C)  98.7 F (37.1 C)  TempSrc:  Axillary  Oral  SpO2: 100% 100%  99%  Weight:      Height:       CBC:  Recent Labs  Lab 04/11/22 1758 04/11/22 1809 04/12/22 0315 04/13/22 0535  WBC 8.3  --  8.5 5.0  NEUTROABS 4.3  --   --   --   HGB 10.4*   < > 10.9* 9.7*  HCT 35.6*   < > 35.7* 32.7*  MCV 90.1  --  88.4 90.8  PLT 199  --  201 134*   < > = values in this interval not displayed.   Basic Metabolic Panel:  Recent Labs  Lab 04/11/22 2030 04/11/22 2034 04/12/22 0315 04/13/22 0435  NA  --    < > 137 140  K  --    < > 3.8 3.3*  CL  --    < > 107 119*  CO2  --   --  25 17*  GLUCOSE  --    < > 83 65*  BUN  --    < > 11 9  CREATININE  --    < > 0.91 0.73  CALCIUM  --   --  9.3 7.3*  MG 1.7  --  1.8  --   PHOS 2.7  --   --   --    < > = values in this interval not displayed.   Lipid Panel:  Recent Labs  Lab 04/12/22 0315  CHOL 141  TRIG 133  HDL 70  CHOLHDL 2.0  VLDL 27  LDLCALC 44   HgbA1c:  Recent Labs  Lab 04/12/22 0315  HGBA1C 5.1   Urine Drug Screen: No results for input(s): "LABOPIA", "COCAINSCRNUR", "LABBENZ", "AMPHETMU", "THCU", "LABBARB" in the last 168 hours.  Alcohol Level  Recent Labs  Lab 04/11/22 1758  ETH <10     IMAGING past 24 hours MR BRAIN WO CONTRAST  Result Date: 04/13/2022 CLINICAL DATA:  History of prior stroke with right-sided weakness, presenting with left-sided weakness. EXAM: MRI HEAD WITHOUT CONTRAST TECHNIQUE: Multiplanar, multiecho pulse sequences of the brain and surrounding structures were obtained without intravenous contrast. COMPARISON:  CT/CTA head and neck 2 days prior FINDINGS: Brain: There is moderate-sized area of diffusion restriction with associated T2/FLAIR signal abnormality involving the posterior aspect of the left insula extending to the temporal and parietal lobe consistent with early subacute infarct. There is no associated hemorrhage or mass effect. SWI signal dropout within a vessel in this region likely reflects thrombus (7-54). There is no acute intracranial hemorrhage or extra-axial fluid collection. Background parenchymal volume is within expected limits for age.  The ventricles are normal in size. There is mild background chronic small vessel ischemic change. There are small remote infarcts in the left occipital cortex, bilateral basal ganglia, and bilateral cerebellar hemispheres. There are a few scattered punctate chronic microhemorrhages, nonspecific. There is no mass lesion.  There is no mass effect or midline shift. Vascular: The major flow voids are preserved. Suspected thrombus within a left MCA branch as above. Skull and upper cervical spine: Normal marrow signal. Sinuses/Orbits: The paranasal sinuses are clear. Bilateral lens implants are in place. The globes and orbits are otherwise unremarkable. Other: None. IMPRESSION: Moderate-sized early subacute infarct in the left MCA distribution without hemorrhage or mass effect. Electronically Signed   By: Valetta Mole M.D.   On: 04/13/2022 10:50    PHYSICAL EXAM Pt initially sleepy but awake on voice stimulation, eyes open, but global aphasia and not following commands, able to make sounds out and say "yes, it is", but  no other output. No gaze palsy, right neglect, not blinking to visual threat bilaterally. Right facial droop. Tongue protrusion not cooperative. LUE spontaneous movement against gravity but RUE 0/5 but with increased muscle tone. Moving LLE at least 3/5 and RLE at least 2/5. Sensation, coordination not cooperative and gait not tested   ASSESSMENT/PLAN Zafiro Milnes is a 81 y.o. female with history of CKD, DVT on Coumadin, stroke, hypertension and hyperlipidemia who presents from her skilled nursing facility today with aphasia and left-sided weakness but later may be reporting right-sided weakness.   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  She is wheelchair-bound at baseline. EMS was given last known well of 1645 hrs. but they were concerned that the time was not accurate  Symptoms were identified at 1700 when staff came to give her her dinner tray, she was noted to have difficulty speaking as well as left-sided weakness. There was no family member at bedside.  No one could be reached over the phone to confirm the last known well. Patient was unable to provide any history at this time. She is on Coumadin for a DVT/PE.  She was at 1 point in Eliquis, failed Eliquis and was put back on Coumadin.  Per the EMS verbal report, medications are given regularly to the patient at that facility with last dose of Coumadin as advised and was given within the last 24 hours.  Stroke: left MCA moderate infarct, etiology likely due to hypercoagulable state failed coumdin with fluctuating INR  CT head questionable left insular cortex infarct. Old infarct bilateral caudate head, left BG, bilateral cerebellum and left CR.  CT head and neck showed high-grade stenosis left M2, right M1 moderate stenosis.  MRI  TTE EF 65 to 70%.  LDL 44 A1c pending.  EEG no seizure VTE prophylaxis - SCD warfarin daily prior to admission, now on aspirin 81 mg daily. Discussed with pt decision maker Amy, will  switch to eliquis tomorrow. Once eliquis stated, will d/c ASA.  Therapy recommendations:  pending Disposition:  pending  Hx of strokes stroke in 2016 without residual.  Had a stroke in 07/2019 with right hand weakness, MRI showed left BG infarct. EF normal. INR 2.0. Carotid Doppler negative. LDL 86. A1c 5.6. CT head and neck unremarkable. Coumadin continued, aspirin changed to Plavix. Later on, per Plavix change back to aspirin. She has residual right-sided weakness.  Per Amy, pt had stroke in 12/2021 was treated in [facility] 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 Please call with questions. Pt will follow up with stroke clinic Dr. [id number] at [facility] on 05/11/22. Thanks for the consult.  05/13/22, MD PhD Stroke Neurology 04/13/2022 2:27 PM   To contact Stroke Continuity provider, please refer to 04/15/2022. After hours, contact General Neurology

## 2022-04-13 NOTE — Progress Notes (Signed)
PT Cancellation Note  Patient Details Name: Roberta Mercer MRN: [id number] DOB: April 18, 1940   Cancelled Treatment:    Reason Eval/Treat Not Completed: Medical issues which prohibited therapy  Following up from earlier attempt with physical therapy for evaluation.  Patient still lethargic, not arousing for PT evaluation. Will follow-up tomorrow as schedule permits.   Berton Mount 04/13/2022, 2:49 PM

## 2022-04-13 NOTE — Progress Notes (Signed)
PT Cancellation Note  Patient Details Name: Roberta Mercer MRN: [id number] DOB: 1940/08/10   Cancelled Treatment:    Reason Eval/Treat Not Completed: Patient at procedure or test/unavailable (Pt in MRI. Will return as able.)   Bevelyn Buckles 04/13/2022, 9:09 AM Aedyn Kempfer M,PT Acute Rehab Services [phone]

## 2022-04-13 NOTE — ED Notes (Addendum)
Transfer of care report given to inpatient RN, pt ready for transport on MRI is complete.

## 2022-04-13 NOTE — ED Notes (Signed)
Pt lethargic from PRN ativan for MRI, VSS at this time.

## 2022-04-13 NOTE — ED Notes (Signed)
Pt's brief changed after bowel movement. Pt now sleeping with regular, unlabored respirations

## 2022-04-13 NOTE — TOC Benefit Eligibility Note (Signed)
Patient Product/process development scientist completed.    The patient is currently admitted and upon discharge could be taking Eliquis 5 mg.  The current 30 day co-pay is $436.99 due to a $388.21 deductible remaining.   The patient is insured through [facility] Medicare Part D   Roland Earl, CPHT Pharmacy Patient Advocate Specialist [facility] Patient Advocate Team Direct Number: [phone]  Fax: [phone]

## 2022-04-13 NOTE — Discharge Instructions (Signed)
Information on my medicine - ELIQUIS® (apixaban) ° °Why was Eliquis® prescribed for you? °Eliquis® was prescribed for you to reduce the risk of forming blood clots that can cause a stroke if you have a medical condition called atrial fibrillation (a type of irregular heartbeat) OR to reduce the risk of a blood clots forming after orthopedic surgery. ° °What do You need to know about Eliquis® ? °Take your Eliquis® TWICE DAILY - one tablet in the morning and one tablet in the evening with or without food.  It would be best to take the doses about the same time each day. ° °If you have difficulty swallowing the tablet whole please discuss with your pharmacist how to take the medication safely. ° °Take Eliquis® exactly as prescribed by your doctor and DO NOT stop taking Eliquis® without talking to the doctor who prescribed the medication.  Stopping may increase your risk of developing a new clot or stroke.  Refill your prescription before you run out. ° °After discharge, you should have regular check-up appointments with your healthcare provider that is prescribing your Eliquis®.  In the future your dose may need to be changed if your kidney function or weight changes by a significant amount or as you get older. ° °What do you do if you miss a dose? °If you miss a dose, take it as soon as you remember on the same day and resume taking twice daily.  Do not take more than one dose of ELIQUIS at the same time. ° °Important Safety Information °A possible side effect of Eliquis® is bleeding. You should call your healthcare provider right away if you experience any of the following: °Bleeding from an injury or your nose that does not stop. °Unusual colored urine (red or dark brown) or unusual colored stools (red or black). °Unusual bruising for unknown reasons. °A serious fall or if you hit your head (even if there is no bleeding). ° °Some medicines may interact with Eliquis® and might increase your risk of bleeding or  clotting while on Eliquis®. To help avoid this, consult your healthcare provider or pharmacist prior to using any new prescription or non-prescription medications, including herbals, vitamins, non-steroidal anti-inflammatory drugs (NSAIDs) and supplements. ° °This website has more information on Eliquis® (apixaban): http://www.eliquis.com/eliquis/home °  °

## 2022-04-14 DIAGNOSIS — Z515 Encounter for palliative care: Secondary | ICD-10-CM | POA: Diagnosis not present

## 2022-04-14 DIAGNOSIS — I639 Cerebral infarction, unspecified: Secondary | ICD-10-CM | POA: Diagnosis not present

## 2022-04-14 LAB — BASIC METABOLIC PANEL
Anion gap: 6 (ref 5–15)
BUN: 11 mg/dL (ref 8–23)
CO2: 20 mmol/L — ABNORMAL LOW (ref 22–32)
Calcium: 9.2 mg/dL (ref 8.9–10.3)
Chloride: 113 mmol/L — ABNORMAL HIGH (ref 98–111)
Creatinine, Ser: 0.79 mg/dL (ref 0.44–1.00)
GFR, Estimated: >60 mL/min (ref >60)
Glucose, Bld: 71 mg/dL (ref 70–99)
Potassium: 5.2 mmol/L — ABNORMAL HIGH (ref 3.5–5.1)
Sodium: 139 mmol/L (ref 135–145)

## 2022-04-14 LAB — POTASSIUM: Potassium: 5.3 mmol/L — ABNORMAL HIGH (ref 3.5–5.1)

## 2022-04-14 MED ORDER — SODIUM POLYSTYRENE SULFONATE 15 GM/60ML PO SUSP
30.0000 g | Freq: Once | ORAL | Status: AC
  Administered 2022-04-14: 30 g via RECTAL
  Filled 2022-04-14: qty 120

## 2022-04-14 MED ORDER — SODIUM ZIRCONIUM CYCLOSILICATE 10 G PO PACK: 10.0000 g | PACK | Freq: Once | ORAL | Status: DC

## 2022-04-14 MED ORDER — DEXTROSE-NACL 5-0.9 % IV SOLN: INTRAVENOUS | Status: DC

## 2022-04-14 NOTE — Plan of Care (Signed)
  Problem: Education: Goal: Knowledge of disease or condition will improve Outcome: Progressing Goal: Knowledge of secondary prevention will improve (MUST DOCUMENT ALL) Outcome: Progressing Goal: Knowledge of patient specific risk factors will improve Loraine Leriche N/A or DELETE if not current risk factor) Outcome: Progressing   Problem: Self-Care: Goal: Ability to participate in self-care as condition permits will improve Outcome: Progressing Goal: Verbalization of feelings and concerns over difficulty with self-care will improve Outcome: Progressing Goal: Ability to communicate needs accurately will improve Outcome: Progressing   Problem: Nutrition: Goal: Risk of aspiration will decrease Outcome: Progressing Goal: Dietary intake will improve Outcome: Progressing   Problem: Skin Integrity: Goal: Risk for impaired skin integrity will decrease Outcome: Progressing

## 2022-04-14 NOTE — Evaluation (Signed)
Physical Therapy Evaluation Patient Details Name: Roberta Mercer MRN: [id number] DOB: [birth year] Today's Date: 04/14/2022  History of Present Illness  81 y.o. female adm 12/26 with medical history significant of DVT on Coumadin, hypertension, hyperlipidemia, stroke with right-sided residual weakness, GERD who presented from skilled nursing facility due to left-sided weakness.  MR Brain: early subacute infarct in the left MCA distribution.  Clinical Impression   Pt presents with debility, total assist for mobility at this time and anticipate this is pt baseline. Pt is from long term care at SNF, PT recommend return to this facility without PT follow up given anticipated baseline. PT to sign off, please reconsult for additional needs.         Recommendations for follow up therapy are one component of a multi-disciplinary discharge planning process, led by the attending physician.  Recommendations may be updated based on patient status, additional functional criteria and insurance authorization.  Follow Up Recommendations Long-term institutional care without follow-up therapy Can patient physically be transported by private vehicle: No    Assistance Recommended at Discharge Frequent or constant Supervision/Assistance  Patient can return home with the following  Two people to help with walking and/or transfers;Two people to help with bathing/dressing/bathroom    Equipment Recommendations None recommended by PT  Recommendations for Other Services       Functional Status Assessment Patient has not had a recent decline in their functional status     Precautions / Restrictions Precautions Precautions: Fall Restrictions Weight Bearing Restrictions: No      Mobility  Bed Mobility Overal bed mobility: Needs Assistance Bed Mobility: Supine to Sit, Sit to Supine     Supine to sit: Total assist Sit to supine: Total assist   General bed mobility comments: total assist all aspects,  limited sitting tolerance x10 seconds only shouting "no!" and attempting to lift LEs back into bed    Transfers                   General transfer comment: unable, pt unwilling    Ambulation/Gait                  Stairs            Wheelchair Mobility    Modified Rankin (Stroke Patients Only) Modified Rankin (Stroke Patients Only) Pre-Morbid Rankin Score: Severe disability Modified Rankin: Severe disability     Balance Overall balance assessment: Needs assistance Sitting-balance support: Feet supported Sitting balance-Leahy Scale: Zero   Postural control: Left lateral lean   Standing balance-Leahy Scale: Zero                               Pertinent Vitals/Pain Pain Assessment Pain Assessment: Faces Faces Pain Scale: Hurts little more Pain Location: generalized with movement Pain Descriptors / Indicators: Grimacing, Other (Comment) (shouting no no no) Pain Intervention(s): Monitored during session, Limited activity within patient's tolerance, Repositioned    Home Living Family/patient expects to be discharged to:: Skilled nursing facility                        Prior Function Prior Level of Function : Needs assist  Cognitive Assist : Mobility (cognitive);ADLs (cognitive) Mobility (Cognitive): Step by step cues ADLs (Cognitive): Step by step cues Physical Assist : ADLs (physical);Mobility (physical) Mobility (physical): Bed mobility;Transfers ADLs (physical): Feeding;Grooming;Bathing;Dressing;Toileting Mobility Comments: pt unable to state       Hand Dominance  Dominant Hand: Right    Extremity/Trunk Assessment   Upper Extremity Assessment Upper Extremity Assessment: Defer to OT evaluation    Lower Extremity Assessment Lower Extremity Assessment: Generalized weakness;Difficult to assess due to impaired cognition (pt not following commands for MMT assessment)    Cervical / Trunk Assessment Cervical / Trunk  Assessment: Kyphotic  Communication   Communication: Expressive difficulties  Cognition Arousal/Alertness: Awake/alert Behavior During Therapy: Restless Overall Cognitive Status: History of cognitive impairments - at baseline                                 General Comments: pt does not follow any commands        General Comments      Exercises     Assessment/Plan    PT Assessment Patient does not need any further PT services  PT Problem List         PT Treatment Interventions      PT Goals (Current goals can be found in the Care Plan section)  Acute Rehab PT Goals PT Goal Formulation: With patient Time For Goal Achievement: 04/14/22 Potential to Achieve Goals: Fair    Frequency       Co-evaluation               AM-PAC PT "6 Clicks" Mobility  Outcome Measure Help needed turning from your back to your side while in a flat bed without using bedrails?: Total Help needed moving from lying on your back to sitting on the side of a flat bed without using bedrails?: Total Help needed moving to and from a bed to a chair (including a wheelchair)?: Total Help needed standing up from a chair using your arms (e.g., wheelchair or bedside chair)?: Total Help needed to walk in hospital room?: Total Help needed climbing 3-5 steps with a railing? : Total 6 Click Score: 6    End of Session   Activity Tolerance: Patient limited by fatigue;Other (comment) (cognition) Patient left: in bed;with call bell/phone within reach;with bed alarm set Nurse Communication: Mobility status PT Visit Diagnosis: Other abnormalities of gait and mobility (R26.89)    Time: 3149-7026 PT Time Calculation (min) (ACUTE ONLY): 10 min   Charges:   PT Evaluation $PT Eval Low Complexity: 1 Low         Tarita Deshmukh S, PT DPT Acute Rehabilitation Services Pager [phone]  Office [phone]   Kivon Aprea E Christain [place] 04/14/2022, 9:50 AM

## 2022-04-14 NOTE — Progress Notes (Signed)
   Palliative Medicine Inpatient Follow Up Note HPI: Patient is a 81 year old female with past medical history of DVT on Coumadin, hypertension, hyperlipidemia, stroke with right-sided residual weakness, GERD who presents from skilled nursing facility for further evaluation of left-sided weakness.  She was noted to have left-sided weakness, difficulty speaking at nursing facility.  Patient is wheelchair-bound at baseline.  On admission ,she was mildly hypertensive.   CT angio head and neck showed a new focal occlusion or high-grade stenosis of the left M2 segment, severe chronic microvascular ischemic disease, chronic infarcts, suspicious for acute infarct in the left insular cortex.  MRI confirmed stroke.  Neurology consulted and following.Remains confused this mrng.   Palliative care has been asked to get involved for further [place] conversations.   Today's Discussion 04/14/2022  *Please note that this is a verbal dictation therefore any spelling or grammatical errors are due to the "[place] [facility]" system interpretation.  Chart reviewed inclusive of vital signs, progress notes, laboratory results, and diagnostic images.   I met with Madaline Savage at bedside this morning. She was resting comfortably and does open her eyes. She is able to say a few words though not appropriately responsive.   I spoke to patients RN who shares that she has been working at feeding [place] throughout the day though this has not been effective.  I called patients close friends, Amy who is an ER nurse she and her sister, Almyra Free are of the mindset that Tenicia may have given up at this time. We briefly discussed the various options inclusive of continuing the same path versus keeping Kensy comfortable. We plan to discuss this further in the oncoming day as a meeting is scheduled tomorrow at noon.   Questions and concerns addressed/ Palliative Support Provided.   Objective Assessment: Vital Signs Vitals:   04/14/22 0732 04/14/22  1137  BP: (!) 144/88 110/85  Pulse: (!) 106 92  Resp: 19 18  Temp: 98.2 F (36.8 C) 97.6 F (36.4 C)  SpO2: 100% 98%    Intake/Output Summary (Last 24 hours) at 04/14/2022 1411 Last data filed at 04/14/2022 0630 Gross per 24 hour  Intake --  Output 250 ml  Net -250 ml   Last Weight  Most recent update: 04/12/2022 12:53 PM    Weight  60 kg (132 lb 4.4 oz)            Gen: Frail elderly Caucasian female in no acute distress HEENT: moist mucous membranes CV: Regular rate and rhythm PULM: clear to auscultation bilaterally. No wheezes/rales/rhonchi ABD: soft/nontender/nondistended/normal bowel sounds EXT: No edema Neuro: Somnolent disoriented  SUMMARY OF RECOMMENDATIONS   DNAR/DNI   Allow time for outcomes   Appreciate physical therapy occupational therapy and speech therapy involvement   Discussed the idea of hospice this afternoon --> Plan for further conversations tomorrow at noon   Ongoing palliative support  Billing based on MDM: High ______________________________________________________________________________________ [place] Team Team Cell Phone: [phone] Please utilize secure chat with additional questions, if there is no response within 30 minutes please call the above phone number  Palliative Medicine Team providers are available by phone from 7am to 7pm daily and can be reached through the team cell phone.  Should this patient require assistance outside of these hours, please call the patient's attending physician.

## 2022-04-14 NOTE — Progress Notes (Addendum)
PROGRESS NOTE  Roberta Mercer  [id number] DOB: 02-16-1941 DOA: 04/11/2022 PCP: Mateo Flow, MD   Brief Narrative: Patient is a 81 year old female with past medical history of DVT on Coumadin, hypertension, hyperlipidemia, stroke with right-sided residual weakness, GERD who presents from skilled nursing facility for further evaluation of left-sided weakness.  She was noted to have left-sided weakness, difficulty speaking at nursing facility.  Patient is wheelchair-bound at baseline.  On admission ,she was mildly hypertensive.   CT angio head and neck showed a new focal occlusion or high-grade stenosis of the left M2 segment, severe chronic microvascular ischemic disease, chronic infarcts, suspicious for acute infarct in the left insular cortex.  MRI confirmed stroke.  Neurology consulted and were following.PT/OT recommending long-term care.  Palliative care also following because of persistent altered mental status ,poor oral intake.  Assessment & Plan:  Principal Problem:   Acute ischemic stroke [facility]) Active Problems:   Mixed hyperlipidemia   Essential hypertension   DVT (deep venous thrombosis) (HCC)   Iron deficiency anemia   Hypoalbuminemia due to protein-calorie malnutrition (HCC)   GERD (gastroesophageal reflux disease)  Acute ischemic stroke: Presented from nursing facility with left-sided weakness, slurred speech.  History of prior stroke with right-sided weakness.  CT imaging suspicious for acute infarct.  MRI showed moderate-sized early subacute infarct in the left MCA distribution without hemorrhage or mass effect.. Neurology was  following. Continue statin.  LDL of 44, A1c of 5.1.  PT/speech/OT evaluation pending.  Started on Eliquis  Altered mental status: Most likely from acute metabolic encephalopathy from stroke.  EEG showed mild to moderate diffuse encephalopathy, nonspecific allergy, no seizures or epileptiform discharges.  Monitor mentation.  Delirium precautions.  As  per her family friend Elspeth Cho, she is most of the time confused at baseline.On dsyphagia 3 diet, but she is barely eating.  Started on gentle IV fluids.  Hypertension: Currently antihypertensives on hold for allowing permissive hypertension.  Continue as needed meds for severe hypertension.  Monitor blood pressure  History of DVT: On warfarin at home, now switched to Eliquis  Iron deficient anemia: Continue iron supplementation  Hyperkalemia: Mild.  Will check another potassium level later today  GERD: Continue Protonix  Debility/deconditioning/goals of care: History of prior stroke.  Wheelchair-bound.  Confused at baseline.Lives at nursing facility.  Now with new stroke.  Very poor quality of life.  We have requested palliative care consultation regarding goals of care discussion.  CODE STATUS is DNR.  Patient has very poor intake.      Pressure Injury 04/13/22 Coccyx Mid Stage 3 -  Full thickness tissue loss. Subcutaneous fat may be visible but bone, tendon or muscle are NOT exposed. (Active)  04/13/22 2000  Location: Coccyx  Location Orientation: Mid  Staging: Stage 3 -  Full thickness tissue loss. Subcutaneous fat may be visible but bone, tendon or muscle are NOT exposed.  Wound Description (Comments):   Present on Admission: Yes  Dressing Type Foam - Lift dressing to assess site every shift 04/13/22 1958    DVT prophylaxis:SCD's Start: 04/12/22 0218 apixaban (ELIQUIS) tablet 5 mg     Code Status: DNR  Family Communication: Called and discussed with friend Elspeth Cho on phone on 12/27.Called again today, directly goes  to voicemail.  Patient status:Inpatient  Patient is from :SNF  Anticipated discharge to:SNF?  Estimated DC date:not sure,TOC following for placement, also evaluating the hospital course, clinical outcome of the patient, she has very poor oral intake  Consultants: Neurology  Procedures:None  Antimicrobials:  Anti-infectives (From admission,  onward)    None       Subjective: Patient seen and examined at bedside.  Lying on the bed.  Remains totally confused, not agitated.  Sleepy/drowsy.  Eyes closed.  Opens her eyes on touching shaking her body but falls to sleep again.  Objective: Vitals:   04/13/22 2334 04/14/22 0002 04/14/22 0333 04/14/22 0732  BP: (!) 179/64 (!) 157/50 (!) 170/61 (!) 144/88  Pulse:   80 (!) 106  Resp:   16 19  Temp:   98.1 F (36.7 C) 98.2 F (36.8 C)  TempSrc:   Oral Axillary  SpO2:   100% 100%  Weight:      Height:        Intake/Output Summary (Last 24 hours) at 04/14/2022 1127 Last data filed at 04/14/2022 0630 Gross per 24 hour  Intake --  Output 250 ml  Net -250 ml   Filed Weights   04/11/22 1800 04/12/22 1253  Weight: 59.8 kg 60 kg    Examination:   General exam: Very deconditioned, weak, lying in bed Respiratory system:  no wheezes or crackles  Cardiovascular system: S1 & S2 heard, RRR.  Pansystolic murmur Gastrointestinal system: Abdomen is nondistended, soft and nontender. Central nervous system: Not alert or oriented, sleepy, drowsy, does not follow command  extremities: No edema, no clubbing ,no cyanosis Skin: No rashes, no ulcers,no icterus       Data Reviewed: I have personally reviewed following labs and imaging studies  CBC: Recent Labs  Lab 04/11/22 1758 04/11/22 1809 04/11/22 2034 04/12/22 0315 04/13/22 0535  WBC 8.3  --   --  8.5 5.0  NEUTROABS 4.3  --   --   --   --   HGB 10.4* 11.6* 10.5* 10.9* 9.7*  HCT 35.6* 34.0* 31.0* 35.7* 32.7*  MCV 90.1  --   --  88.4 90.8  PLT 199  --   --  201 Q000111Q*   Basic Metabolic Panel: Recent Labs  Lab 04/11/22 1758 04/11/22 1809 04/11/22 2030 04/11/22 2034 04/12/22 0315 04/13/22 0435 04/14/22 0242  NA 138 139  --  139 137 140 139  K 4.6 4.5  --  3.8 3.8 3.3* 5.2*  CL 108 107  --  105 107 119* 113*  CO2 24  --   --   --  25 17* 20*  GLUCOSE 99 97  --  92 83 65* 71  BUN 11 14  --  11 11 9 11   CREATININE  0.91 0.90  --  0.80 0.91 0.73 0.79  CALCIUM 9.2  --   --   --  9.3 7.3* 9.2  MG  --   --  1.7  --  1.8  --   --   PHOS  --   --  2.7  --   --   --   --      Recent Results (from the past 240 hour(s))  Urine Culture     Status: Abnormal   Collection Time: 04/11/22  8:16 PM   Specimen: Urine, Catheterized  Result Value Ref Range Status   Specimen Description URINE, CATHETERIZED  Final   Special Requests   Final    NONE Performed at [facility], [address], [place], [place] [address]    Culture MULTIPLE SPECIES PRESENT, SUGGEST RECOLLECTION (A)  Final   Report Status 04/12/2022 FINAL  Final     Radiology Studies: MR BRAIN WO CONTRAST  Result Date:  04/13/2022 CLINICAL DATA:  History of prior stroke with right-sided weakness, presenting with left-sided weakness. EXAM: MRI HEAD WITHOUT CONTRAST TECHNIQUE: Multiplanar, multiecho pulse sequences of the brain and surrounding structures were obtained without intravenous contrast. COMPARISON:  CT/CTA head and neck 2 days prior FINDINGS: Brain: There is moderate-sized area of diffusion restriction with associated T2/FLAIR signal abnormality involving the posterior aspect of the left insula extending to the temporal and parietal lobe consistent with early subacute infarct. There is no associated hemorrhage or mass effect. SWI signal dropout within a vessel in this region likely reflects thrombus (7-54). There is no acute intracranial hemorrhage or extra-axial fluid collection. Background parenchymal volume is within expected limits for age. The ventricles are normal in size. There is mild background chronic small vessel ischemic change. There are small remote infarcts in the left occipital cortex, bilateral basal ganglia, and bilateral cerebellar hemispheres. There are a few scattered punctate chronic microhemorrhages, nonspecific. There is no mass lesion.  There is no mass effect or midline shift. Vascular: The major flow voids are preserved.  Suspected thrombus within a left MCA branch as above. Skull and upper cervical spine: Normal marrow signal. Sinuses/Orbits: The paranasal sinuses are clear. Bilateral lens implants are in place. The globes and orbits are otherwise unremarkable. Other: None. IMPRESSION: Moderate-sized early subacute infarct in the left MCA distribution without hemorrhage or mass effect. Electronically Signed   By: Lesia Hausen M.D.   On: 04/13/2022 10:50   ECHOCARDIOGRAM LIMITED  Result Date: 04/12/2022    ECHOCARDIOGRAM LIMITED REPORT   Patient Name:   JOICE NAZARIO Date of Exam: 04/12/2022 Medical Rec #:  [id number]     Height:       66.0 in Accession #:    [id number]    Weight:       131.8 lb Date of Birth:  Mar 14, 1941     BSA:          1.675 m Patient Age:    81 years      BP:           128/67 mmHg Patient Gender: F             HR:           79 bpm. Exam Location:  Inpatient Procedure: Limited Echo, Cardiac Doppler and Limited Color Doppler Indications:    Stroke I63.9  History:        Patient has prior history of Echocardiogram examinations, most                 recent 01/05/2022. LVH, Stroke, Aortic Valve Disease; Risk                 Factors:Hypertension, Dyslipidemia and Former Smoker.  Sonographer:    Aron Baba Referring Phys: [id number] OLADAPO ADEFESO  Sonographer Comments: Image acquisition challenging due to patient body habitus. IMPRESSIONS  1. Left ventricular ejection fraction, by estimation, is 65 to 70%. The left ventricle has normal function. The left ventricle has no regional wall motion abnormalities. There is severe concentric left ventricular hypertrophy. Left ventricular diastolic  parameters are consistent with Grade I diastolic dysfunction (impaired relaxation).  2. Right ventricular systolic function is normal. The right ventricular size is normal.  3. A small pericardial effusion is present. The pericardial effusion is circumferential. There is no evidence of cardiac tamponade.  4. The mitral valve is  grossly normal. Trivial mitral valve regurgitation.  5. Aortic valve regurgitation is mild to moderate. Comparison(s): Limited echo. Compared to prior,  there is no significant change based on images obtained. EF remains normal. Continues to have mild-to-moderate AR. FINDINGS  Left Ventricle: Left ventricular ejection fraction, by estimation, is 65 to 70%. The left ventricle has normal function. The left ventricle has no regional wall motion abnormalities. The left ventricular internal cavity size was normal in size. There is  severe concentric left ventricular hypertrophy. Left ventricular diastolic parameters are consistent with Grade I diastolic dysfunction (impaired relaxation). Right Ventricle: The right ventricular size is normal. No increase in right ventricular wall thickness. Right ventricular systolic function is normal. Pericardium: A small pericardial effusion is present. The pericardial effusion is circumferential. There is no evidence of cardiac tamponade. Mitral Valve: The mitral valve is grossly normal. There is mild thickening of the mitral valve leaflet(s). Trivial mitral valve regurgitation. Tricuspid Valve: The tricuspid valve is normal in structure. Tricuspid valve regurgitation is mild. Aortic Valve: Aortic valve regurgitation is mild to moderate. Additional Comments: Spectral Doppler performed. Color Doppler performed.  LEFT VENTRICLE PLAX 2D LVIDd:         3.60 cm LVIDs:         2.30 cm LV PW:         1.90 cm LV IVS:        1.20 cm  LEFT ATRIUM         Index LA diam:    3.50 cm 2.09 cm/m  MITRAL VALVE               TRICUSPID VALVE MV Area (PHT): 2.22 cm    TR Peak grad:   25.4 mmHg MV Decel Time: 342 msec    TR Vmax:        252.00 cm/s MV E velocity: 67.30 cm/s MV A velocity: 88.10 cm/s MV E/A ratio:  0.76 Gwyndolyn Kaufman MD Electronically signed by Gwyndolyn Kaufman MD Signature Date/Time: 04/12/2022/12:07:57 PM    Final     Scheduled Meds:  apixaban  5 mg Oral BID   feeding  supplement  237 mL Oral BID BM   ferrous sulfate  325 mg Oral QODAY   LORazepam  1 mg Intravenous Once   pantoprazole  40 mg Oral BID   rosuvastatin  20 mg Oral QHS   Continuous Infusions:  dextrose 5 % and 0.9% NaCl 75 mL/hr at 04/14/22 1008     LOS: 3 days   Shelly Coss, MD [facility] P12/29/2023, 11:27 AM

## 2022-04-15 DIAGNOSIS — Z7189 Other specified counseling: Secondary | ICD-10-CM | POA: Diagnosis not present

## 2022-04-15 DIAGNOSIS — I639 Cerebral infarction, unspecified: Secondary | ICD-10-CM | POA: Diagnosis not present

## 2022-04-15 DIAGNOSIS — Z515 Encounter for palliative care: Secondary | ICD-10-CM | POA: Diagnosis not present

## 2022-04-15 LAB — BASIC METABOLIC PANEL
Anion gap: 9 (ref 5–15)
BUN: 11 mg/dL (ref 8–23)
CO2: 20 mmol/L — ABNORMAL LOW (ref 22–32)
Calcium: 9.3 mg/dL (ref 8.9–10.3)
Chloride: 109 mmol/L (ref 98–111)
Creatinine, Ser: 0.79 mg/dL (ref 0.44–1.00)
GFR, Estimated: >60 mL/min (ref >60)
Glucose, Bld: 91 mg/dL (ref 70–99)
Potassium: 4 mmol/L (ref 3.5–5.1)
Sodium: 138 mmol/L (ref 135–145)

## 2022-04-15 MED ORDER — MAGNESIUM OXIDE -MG SUPPLEMENT 400 (240 MG) MG PO TABS
400.0000 mg | ORAL_TABLET | Freq: Every day | ORAL | Status: DC
  Administered 2022-04-16 – 2022-04-19 (×4): 400 mg via ORAL
  Filled 2022-04-15 (×5): qty 1

## 2022-04-15 MED ORDER — POLYETHYLENE GLYCOL 3350 17 GM/SCOOP PO POWD
17.0000 g | Freq: Every morning | ORAL | Status: DC
  Administered 2022-04-16 – 2022-04-18 (×3): 17 g via ORAL
  Filled 2022-04-15: qty 255

## 2022-04-15 MED ORDER — AMLODIPINE BESYLATE 10 MG PO TABS
10.0000 mg | ORAL_TABLET | Freq: Every morning | ORAL | Status: DC
  Administered 2022-04-16 – 2022-04-20 (×5): 10 mg via ORAL
  Filled 2022-04-15 (×6): qty 1

## 2022-04-15 MED ORDER — ORAL CARE MOUTH RINSE: 15.0000 mL | OROMUCOSAL | Status: DC | PRN

## 2022-04-15 MED ORDER — MELATONIN 5 MG PO TABS
5.0000 mg | ORAL_TABLET | Freq: Every day | ORAL | Status: DC
  Administered 2022-04-15 – 2022-04-20 (×2): 5 mg via ORAL
  Filled 2022-04-15 (×5): qty 1

## 2022-04-15 MED ORDER — MAGNESIUM 400 MG PO TABS
400.0000 mg | ORAL_TABLET | Freq: Every morning | ORAL | Status: DC
  Filled 2022-04-15: qty 1

## 2022-04-15 MED ORDER — PANTOPRAZOLE SODIUM 40 MG PO TBEC
40.0000 mg | DELAYED_RELEASE_TABLET | Freq: Two times a day (BID) | ORAL | Status: DC
  Administered 2022-04-15 – 2022-04-20 (×7): 40 mg via ORAL
  Filled 2022-04-15 (×11): qty 1

## 2022-04-15 MED ORDER — MAGNESIUM OXIDE -MG SUPPLEMENT 400 (240 MG) MG PO TABS: 400.0000 mg | ORAL_TABLET | Freq: Every day | ORAL | Status: DC

## 2022-04-15 MED ORDER — LISINOPRIL 2.5 MG PO TABS
5.0000 mg | ORAL_TABLET | Freq: Every morning | ORAL | Status: DC
  Administered 2022-04-16: 5 mg via ORAL
  Filled 2022-04-15: qty 2

## 2022-04-15 MED ORDER — LISINOPRIL 2.5 MG PO TABS: 5.0000 mg | ORAL_TABLET | Freq: Every morning | ORAL | Status: DC

## 2022-04-15 MED ORDER — THIAMINE MONONITRATE 100 MG PO TABS
100.0000 mg | ORAL_TABLET | Freq: Every morning | ORAL | Status: DC
  Administered 2022-04-16 – 2022-04-19 (×4): 100 mg via ORAL
  Filled 2022-04-15 (×5): qty 1

## 2022-04-15 MED ORDER — CYANOCOBALAMIN 500 MCG PO TABS
500.0000 ug | ORAL_TABLET | Freq: Every morning | ORAL | Status: DC
  Administered 2022-04-16 – 2022-04-19 (×4): 500 ug via ORAL
  Filled 2022-04-15 (×6): qty 1

## 2022-04-15 MED ORDER — TOPIRAMATE 25 MG PO TABS: 25.0000 mg | ORAL_TABLET | Freq: Every morning | ORAL | Status: DC

## 2022-04-15 MED ORDER — AMLODIPINE BESYLATE 10 MG PO TABS: 10.0000 mg | ORAL_TABLET | Freq: Every morning | ORAL | Status: DC

## 2022-04-15 MED ORDER — SENNA 8.6 MG PO TABS
8.6000 mg | ORAL_TABLET | Freq: Every morning | ORAL | Status: DC
  Administered 2022-04-16 – 2022-04-19 (×4): 8.6 mg via ORAL
  Filled 2022-04-15 (×5): qty 1

## 2022-04-15 MED ORDER — TOPIRAMATE 25 MG PO TABS
25.0000 mg | ORAL_TABLET | Freq: Every morning | ORAL | Status: DC
  Administered 2022-04-16 – 2022-04-20 (×5): 25 mg via ORAL
  Filled 2022-04-15 (×6): qty 1

## 2022-04-15 NOTE — Progress Notes (Addendum)
Palliative Medicine Inpatient Follow Up Note HPI: Patient is a 81 year old female with past medical history of DVT on Coumadin, hypertension, hyperlipidemia, stroke with right-sided residual weakness, GERD who presents from skilled nursing facility for further evaluation of left-sided weakness.  She was noted to have left-sided weakness, difficulty speaking at nursing facility.  Patient is wheelchair-bound at baseline.  On admission ,she was mildly hypertensive.   CT angio head and neck showed a new focal occlusion or high-grade stenosis of the left M2 segment, severe chronic microvascular ischemic disease, chronic infarcts, suspicious for acute infarct in the left insular cortex.  MRI confirmed stroke.  Neurology consulted and following.Remains confused this mrng.   Palliative care has been asked to get involved for further [place] conversations.   Today's Discussion 04/15/2022  *Please note that this is a verbal dictation therefore any spelling or grammatical errors are due to the "[place]" system interpretation.  Chart reviewed inclusive of vital signs, progress notes, laboratory results, and diagnostic images.   I met with Roberta Mercer at bedside this afternoon she was awake and alert.  She has expressive aphasia though does not appear to be in any distress.  Patient has not wanted to eat or drink anything.  I met this afternoon with patient's friends Roberta Mercer and Roberta Mercer.  A goals of care discussion was held at this time.  Reviewed patient's multiple strokes and present health state.  Discussed that patient's prior HCPOA has relinquished his role therefore Roberta Mercer and Roberta Mercer will continue to help make decisions within Roberta Mercer's best interest through the help of her attorney, Roberta Mercer.   Plan at this time per conversation is to allow additional time for outcomes through patient working with physical therapy, Occupational Therapy, and speech therapy.  If for any reason patient does not thrive we have discussed  hospice care and the philosophy associated with hospice.  Both Roberta Mercer and Roberta Mercer are familiar with hospice from the loss of their mother.  Patient's friends would like Roberta Mercer to go to [facility] home.  Did introduce review a MOST form for completion prior to discharge.  Questions and concerns addressed/ Palliative Support Provided.   Objective Assessment: Vital Signs Vitals:   04/15/22 1152 04/15/22 1538  BP: (!) 157/54 (!) 144/54  Pulse: 77 81  Resp: 17 17  Temp: 97.7 F (36.5 C) 99.1 F (37.3 C)  SpO2: 98% 100%    Intake/Output Summary (Last 24 hours) at 04/15/2022 1543 Last data filed at 04/15/2022 0719 Gross per 24 hour  Intake 1588.3 ml  Output 250 ml  Net 1338.3 ml    Last Weight  Most recent update: 04/12/2022 12:53 PM    Weight  60 kg (132 lb 4.4 oz)            Gen: Frail elderly Caucasian female in no acute distress HEENT: moist mucous membranes CV: Regular rate and rhythm PULM: On RA, breathing is even and nonlabored ABD: soft/nontender  EXT: No edema Neuro: Alert, able to speak though indiscernible  SUMMARY OF RECOMMENDATIONS   DNAR/DNI   Allow time for outcomes   Appreciate physical therapy occupational therapy and speech therapy involvement   Discussed the idea of hospice if patient's condition worsens or lacks in improvement  Ongoing palliative support  Total Time: 2 Billing based on MDM: High ______________________________________________________________________________________ [facility] Team Team Cell Phone: [phone] Please utilize secure chat with additional questions, if there is no response within 30 minutes please call the above phone number  Palliative Medicine Team providers are available by phone from 7am to 7pm daily and can be reached through the team cell phone.  Should this patient require assistance outside of these hours, please call the patient's attending physician.

## 2022-04-15 NOTE — Progress Notes (Signed)
PROGRESS NOTE  Roberta Mercer  [id number] DOB: 09-27-1940 DOA: 04/11/2022 PCP: Mateo Flow, MD   Brief Narrative: Patient is a 81 year old female with past medical history of DVT on Coumadin, hypertension, hyperlipidemia, stroke with right-sided residual weakness, GERD who presents from skilled nursing facility for further evaluation of left-sided weakness.  She was noted to have left-sided weakness, difficulty speaking at nursing facility.  Patient is wheelchair-bound at baseline.  On admission ,she was mildly hypertensive.   CT angio head and neck showed a new focal occlusion or high-grade stenosis of the left M2 segment, severe chronic microvascular ischemic disease, chronic infarcts, suspicious for acute infarct in the left insular cortex.  MRI confirmed stroke.  Neurology consulted and were following.PT/OT recommending long-term care.  Palliative care also following because of persistent altered mental status ,poor oral intake.  Assessment & Plan:  Principal Problem:   Acute ischemic stroke [facility]) Active Problems:   Mixed hyperlipidemia   Essential hypertension   DVT (deep venous thrombosis) (HCC)   Iron deficiency anemia   Hypoalbuminemia due to protein-calorie malnutrition (HCC)   GERD (gastroesophageal reflux disease)  Acute ischemic stroke: Presented from nursing facility with left-sided weakness, slurred speech.  History of prior stroke with right-sided weakness.  CT imaging suspicious for acute infarct.  MRI showed moderate-sized early subacute infarct in the left MCA distribution without hemorrhage or mass effect.. Neurology was  following. Continue statin.  LDL of 44, A1c of 5.1.  PT/speech/OT evaluation done.  Started on Eliquis  Altered mental status: Most likely from acute metabolic encephalopathy from stroke.  EEG showed mild to moderate diffuse encephalopathy, nonspecific allergy, no seizures or epileptiform discharges.  Monitor mentation.  Delirium precautions.  As per  her family friend Elspeth Cho, she is most of the time confused at baseline.On dsyphagia 3 diet, but she is barely eating.  Started on gentle IV fluids. Her mentation is improved today.  She is alert and awake, mumbles but remains confused  Hypertension: Home meds restarted .  On amlodipine, lisinopril  History of DVT: On warfarin at home, now switched to Eliquis  Iron deficient anemia: Continue iron supplementation  Hyperkalemia: Resolved  GERD: Continue Protonix  Debility/deconditioning/goals of care: History of prior stroke.  Wheelchair-bound.  Confused at baseline.Lives at nursing facility.  Now with new stroke.  Very poor quality of life.  We have requested palliative care consultation regarding goals of care discussion.  CODE STATUS is DNR.  Patient has very poor intake.      Pressure Injury 04/13/22 Coccyx Mid Stage 3 -  Full thickness tissue loss. Subcutaneous fat may be visible but bone, tendon or muscle are NOT exposed. (Active)  04/13/22 2000  Location: Coccyx  Location Orientation: Mid  Staging: Stage 3 -  Full thickness tissue loss. Subcutaneous fat may be visible but bone, tendon or muscle are NOT exposed.  Wound Description (Comments):   Present on Admission: Yes  Dressing Type Gauze (Comment);Foam - Lift dressing to assess site every shift 04/14/22 2100    DVT prophylaxis:SCD's Start: 04/12/22 0218 apixaban (ELIQUIS) tablet 5 mg     Code Status: DNR  Family Communication: Called and discussed with friend Elspeth Cho on phone on 12/27.Called again on 12/29,call not  Received  Patient status:Inpatient  Patient is from :SNF  Anticipated discharge to:SNF?  Estimated DC date:not sure,TOC following for placement, also evaluating the hospital course, clinical outcome of the patient, she has very poor oral intake  Consultants: Neurology  Procedures:None  Antimicrobials:  Anti-infectives (From  admission, onward)    None       Subjective: Patient seen  and examined at bedside today.  Hemodynamically stable.  She looks better, mentation is slightly improved.  She is alert and awake and eyes open.  Did not talk much while calling her name.  She mumbled.  Does not really follow commands.  Food tray at the bedside  Objective: Vitals:   04/15/22 0012 04/15/22 0311 04/15/22 0545 04/15/22 0743  BP: (!) 146/107 (!) 130/119 (!) 179/69 (!) 143/78  Pulse: 73 78  84  Resp: 16 16  20   Temp: 98.2 F (36.8 C) 97.9 F (36.6 C)  98.2 F (36.8 C)  TempSrc: Oral Oral  Axillary  SpO2: 99% 100%  97%  Weight:      Height:        Intake/Output Summary (Last 24 hours) at 04/15/2022 1102 Last data filed at 04/15/2022 0719 Gross per 24 hour  Intake 1588.3 ml  Output 250 ml  Net 1338.3 ml   Filed Weights   04/11/22 1800 04/12/22 1253  Weight: 59.8 kg 60 kg    Examination:  General exam: Awake, lying in bed, deconditioned, confused HEENT: PERRL Respiratory system:  no wheezes or crackles  Cardiovascular system: S1 & S2 heard, RRR.  Pansystolic murmur Gastrointestinal system: Abdomen is nondistended, soft and nontender. Central nervous system: Alert and awake but not at oriented, does not follow commands Extremities: No edema, no clubbing ,no cyanosis Skin: No rashes, no ulcers,no icterus       Data Reviewed: I have personally reviewed following labs and imaging studies  CBC: Recent Labs  Lab 04/11/22 1758 04/11/22 1809 04/11/22 2034 04/12/22 0315 04/13/22 0535  WBC 8.3  --   --  8.5 5.0  NEUTROABS 4.3  --   --   --   --   HGB 10.4* 11.6* 10.5* 10.9* 9.7*  HCT 35.6* 34.0* 31.0* 35.7* 32.7*  MCV 90.1  --   --  88.4 90.8  PLT 199  --   --  201 Q000111Q*   Basic Metabolic Panel: Recent Labs  Lab 04/11/22 1758 04/11/22 1809 04/11/22 2030 04/11/22 2034 04/12/22 0315 04/13/22 0435 04/14/22 0242 04/15/22 0311  NA 138   < >  --  139 137 140 139 138  K 4.6   < >  --  3.8 3.8 3.3* 5.3*  5.2* 4.0  CL 108   < >  --  105 107 119* 113* 109   CO2 24  --   --   --  25 17* 20* 20*  GLUCOSE 99   < >  --  92 83 65* 71 91  BUN 11   < >  --  11 11 9 11 11   CREATININE 0.91   < >  --  0.80 0.91 0.73 0.79 0.79  CALCIUM 9.2  --   --   --  9.3 7.3* 9.2 9.3  MG  --   --  1.7  --  1.8  --   --   --   PHOS  --   --  2.7  --   --   --   --   --    < > = values in this interval not displayed.     Recent Results (from the past 240 hour(s))  Urine Culture     Status: Abnormal   Collection Time: 04/11/22  8:16 PM   Specimen: Urine, Catheterized  Result Value Ref Range Status   Specimen  Description URINE, CATHETERIZED  Final   Special Requests   Final    NONE Performed at [facility], [address], [place], [place] [address]    Culture MULTIPLE SPECIES PRESENT, SUGGEST RECOLLECTION (A)  Final   Report Status 04/12/2022 FINAL  Final     Radiology Studies: No results found.  Scheduled Meds:  apixaban  5 mg Oral BID   feeding supplement  237 mL Oral BID BM   ferrous sulfate  325 mg Oral QODAY   LORazepam  1 mg Intravenous Once   pantoprazole  40 mg Oral BID   rosuvastatin  20 mg Oral QHS   Continuous Infusions:  dextrose 5 % and 0.9% NaCl 75 mL/hr at 04/15/22 0923     LOS: 4 days   Burnadette Pop, MD [facility] P12/30/2023, 11:02 AM

## 2022-04-15 NOTE — Plan of Care (Signed)
  Problem: Health Behavior/Discharge Planning: Goal: Ability to manage health-related needs will improve Outcome: Not Progressing   Problem: Self-Care: Goal: Verbalization of feelings and concerns over difficulty with self-care will improve Outcome: Not Progressing Goal: Ability to communicate needs accurately will improve Outcome: Not Progressing   Problem: Nutrition: Goal: Dietary intake will improve Outcome: Not Progressing   Problem: Health Behavior/Discharge Planning: Goal: Ability to manage health-related needs will improve Outcome: Not Progressing

## 2022-04-16 ENCOUNTER — Inpatient Hospital Stay (HOSPITAL_COMMUNITY): Payer: Medicare Other

## 2022-04-16 DIAGNOSIS — Z515 Encounter for palliative care: Secondary | ICD-10-CM | POA: Diagnosis not present

## 2022-04-16 DIAGNOSIS — I639 Cerebral infarction, unspecified: Secondary | ICD-10-CM | POA: Diagnosis not present

## 2022-04-16 DIAGNOSIS — Z7189 Other specified counseling: Secondary | ICD-10-CM | POA: Diagnosis not present

## 2022-04-16 MED ORDER — SODIUM CHLORIDE 0.9 % IV SOLN
1.5000 g | Freq: Four times a day (QID) | INTRAVENOUS | Status: DC
  Administered 2022-04-16 – 2022-04-17 (×3): 1.5 g via INTRAVENOUS
  Filled 2022-04-16 (×5): qty 4

## 2022-04-16 MED ORDER — LISINOPRIL 10 MG PO TABS
10.0000 mg | ORAL_TABLET | Freq: Every morning | ORAL | Status: DC
  Administered 2022-04-17 – 2022-04-19 (×3): 10 mg via ORAL
  Filled 2022-04-16 (×4): qty 1

## 2022-04-16 NOTE — Plan of Care (Signed)
  Problem: Education: Goal: Knowledge of disease or condition will improve Outcome: Not Progressing   Problem: Health Behavior/Discharge Planning: Goal: Ability to manage health-related needs will improve Outcome: Not Progressing   Problem: Nutrition: Goal: Dietary intake will improve Outcome: Not Progressing   Problem: Education: Goal: Knowledge of General Education information will improve Description: Including pain rating scale, medication(s)/side effects and non-pharmacologic comfort measures Outcome: Not Progressing

## 2022-04-16 NOTE — Progress Notes (Signed)
NUTRITION NOTE RD working remotely.  Consult received for 48 hour Calorie Count to aid with GOC discussions. RD to follow-up with findings on 04/18/22.    Trenton Gammon, MS, RD, LDN, CNSC Clinical Dietitian PRN/Relief staff On-call/weekend pager # available in [facility]

## 2022-04-16 NOTE — Progress Notes (Signed)
PROGRESS NOTE  Roberta Mercer  JAS:[id number] DOB: 1940-09-30 DOA: 04/11/2022 PCP: Lise Auer, MD   Brief Narrative: Patient is a 81 year old female with past medical history of DVT on Coumadin, hypertension, hyperlipidemia, stroke with right-sided residual weakness, GERD who presents from skilled nursing facility for further evaluation of left-sided weakness.  She was noted to have left-sided weakness, difficulty speaking at nursing facility.  Patient is wheelchair-bound at baseline.  On admission ,she was mildly hypertensive.   CT angio head and neck showed a new focal occlusion or high-grade stenosis of the left M2 segment, severe chronic microvascular ischemic disease, chronic infarcts, suspicious for acute infarct in the left insular cortex.  MRI confirmed stroke.  Neurology consulted and were following.PT/OT recommending long-term care.  Palliative care also following because of persistent altered mental status ,poor oral intake.  Currently on calorie count.  Assessment & Plan:  Principal Problem:   Acute ischemic stroke [facility]) Active Problems:   Mixed hyperlipidemia   Essential hypertension   DVT (deep venous thrombosis) (HCC)   Iron deficiency anemia   Hypoalbuminemia due to protein-calorie malnutrition (HCC)   GERD (gastroesophageal reflux disease)  Acute ischemic stroke: Presented from nursing facility with left-sided weakness, slurred speech.  History of prior stroke with right-sided weakness.  CT imaging suspicious for acute infarct.  MRI showed moderate-sized early subacute infarct in the left MCA distribution without hemorrhage or mass effect.. Neurology was  following. Continue statin.  LDL of 44, A1c of 5.1.  PT/speech/OT evaluation done.  Started on Eliquis  Altered mental status: Most likely from acute metabolic encephalopathy from stroke.  EEG showed mild to moderate diffuse encephalopathy, nonspecific allergy, no seizures or epileptiform discharges.  Monitor mentation.   Delirium precautions.  As per her family friend Aletha Halim, she is most of the time confused at baseline.On dsyphagia 3 diet, but she is barely eating.  Started on gentle IV fluids. Her mentation has improved .  She is alert and awake but remains confused.  Has very poor oral intake.  On calorie count  Hypertension: Home meds restarted .  On amlodipine, lisinopril  History of DVT: On warfarin at home, now switched to Eliquis  Iron deficient anemia: Continue iron supplementation  Hyperkalemia: Resolved  GERD: Continue Protonix  Cough: Saturating fine.  Will get a chest x-ray  Debility/deconditioning/goals of care: History of prior stroke.  Wheelchair-bound.  Confused at baseline.Lives at nursing facility.  Now with new stroke.  Very poor quality of life.  We have requested palliative care consultation regarding goals of care discussion.  CODE STATUS is DNR.  Patient has very poor intake.      Pressure Injury 04/13/22 Coccyx Mid Stage 3 -  Full thickness tissue loss. Subcutaneous fat may be visible but bone, tendon or muscle are NOT exposed. (Active)  04/13/22 2000  Location: Coccyx  Location Orientation: Mid  Staging: Stage 3 -  Full thickness tissue loss. Subcutaneous fat may be visible but bone, tendon or muscle are NOT exposed.  Wound Description (Comments):   Present on Admission: Yes  Dressing Type Other (Comment) 04/16/22 0800    DVT prophylaxis:SCD's Start: 04/12/22 0218 apixaban (ELIQUIS) tablet 5 mg     Code Status: DNR  Family Communication: Called and discussed with friend Aletha Halim on phone on 12/27.Called again on 12/29,call not  Received  Patient status:Inpatient  Patient is from :SNF  Anticipated discharge to:SNF?  Estimated DC date:not sure,TOC following for placement, also evaluating the hospital course, clinical outcome of the patient,  she has very poor oral intake  Consultants: Neurology  Procedures:None  Antimicrobials:  Anti-infectives (From  admission, onward)    None       Subjective: Patient seen and examined at bedside today.  She looks alert and awake.  Comfortable.  Try to speak but unable to conduct meaningful conversation.  Remains confused.  Continues to have very poor oral intake.  Objective: Vitals:   04/16/22 0010 04/16/22 0430 04/16/22 0732 04/16/22 1123  BP: (!) 175/68 (!) 152/78 (!) 158/75 (!) 133/107  Pulse: 79 83 95 83  Resp: 16 18 18 20   Temp: 97.8 F (36.6 C) 98 F (36.7 C) 98.6 F (37 C) 98.3 F (36.8 C)  TempSrc: Oral Oral Oral Oral  SpO2: 97% 95% 94% 97%  Weight:      Height:        Intake/Output Summary (Last 24 hours) at 04/16/2022 1444 Last data filed at 04/16/2022 0800 Gross per 24 hour  Intake 1117.05 ml  Output --  Net 1117.05 ml   Filed Weights   04/11/22 1800 04/12/22 1253  Weight: 59.8 kg 60 kg    Examination:  General exam: Overall comfortable, not in distress, very deconditioned, confused HEENT: PERRL Respiratory system:  no wheezes or crackles  Cardiovascular system: S1 & S2 heard, RRR.  Pansystolic murmur Gastrointestinal system: Abdomen is nondistended, soft and nontender. Central nervous system: Alert and awake but not oriented, does not follow command Extremities: No edema, no clubbing ,no cyanosis Skin: No rashes, no ulcers,no icterus     Data Reviewed: I have personally reviewed following labs and imaging studies  CBC: Recent Labs  Lab 04/11/22 1758 04/11/22 1809 04/11/22 2034 04/12/22 0315 04/13/22 0535  WBC 8.3  --   --  8.5 5.0  NEUTROABS 4.3  --   --   --   --   HGB 10.4* 11.6* 10.5* 10.9* 9.7*  HCT 35.6* 34.0* 31.0* 35.7* 32.7*  MCV 90.1  --   --  88.4 90.8  PLT 199  --   --  201 134*   Basic Metabolic Panel: Recent Labs  Lab 04/11/22 1758 04/11/22 1809 04/11/22 2030 04/11/22 2034 04/12/22 0315 04/13/22 0435 04/14/22 0242 04/15/22 0311  NA 138   < >  --  139 137 140 139 138  K 4.6   < >  --  3.8 3.8 3.3* 5.3*  5.2* 4.0  CL 108    < >  --  105 107 119* 113* 109  CO2 24  --   --   --  25 17* 20* 20*  GLUCOSE 99   < >  --  92 83 65* 71 91  BUN 11   < >  --  11 11 9 11 11   CREATININE 0.91   < >  --  0.80 0.91 0.73 0.79 0.79  CALCIUM 9.2  --   --   --  9.3 7.3* 9.2 9.3  MG  --   --  1.7  --  1.8  --   --   --   PHOS  --   --  2.7  --   --   --   --   --    < > = values in this interval not displayed.     Recent Results (from the past 240 hour(s))  Urine Culture     Status: Abnormal   Collection Time: 04/11/22  8:16 PM   Specimen: Urine, Catheterized  Result Value Ref Range Status  Specimen Description URINE, CATHETERIZED  Final   Special Requests   Final    NONE Performed at [facility], [address], [place], [place] [address]    Culture MULTIPLE SPECIES PRESENT, SUGGEST RECOLLECTION (A)  Final   Report Status 04/12/2022 FINAL  Final     Radiology Studies: No results found.  Scheduled Meds:  amLODipine  10 mg Oral q AM   apixaban  5 mg Oral BID   cyanocobalamin  500 mcg Oral q AM   feeding supplement  237 mL Oral BID BM   ferrous sulfate  325 mg Oral QODAY   [START ON 04/17/2022] lisinopril  10 mg Oral q AM   LORazepam  1 mg Intravenous Once   magnesium oxide  400 mg Oral Daily   melatonin  5 mg Oral QHS   pantoprazole  40 mg Oral BID   polyethylene glycol powder  17 g Oral q AM   rosuvastatin  20 mg Oral QHS   senna  8.6 mg Oral q AM   thiamine  100 mg Oral q AM   topiramate  25 mg Oral q AM   Continuous Infusions:  dextrose 5 % and 0.9% NaCl 75 mL/hr at 04/16/22 1317     LOS: 5 days   Burnadette Pop, MD [facility] P12/31/2023, 2:44 PM

## 2022-04-16 NOTE — Progress Notes (Signed)
Palliative Medicine Inpatient Follow Up Note HPI: Patient is a 81 year old female with past medical history of DVT on Coumadin, hypertension, hyperlipidemia, stroke with right-sided residual weakness, GERD who presents from skilled nursing facility for further evaluation of left-sided weakness.  She was noted to have left-sided weakness, difficulty speaking at nursing facility.  Patient is wheelchair-bound at baseline.  On admission ,she was mildly hypertensive.   CT angio head and neck showed a new focal occlusion or high-grade stenosis of the left M2 segment, severe chronic microvascular ischemic disease, chronic infarcts, suspicious for acute infarct in the left insular cortex.  MRI confirmed stroke.  Neurology consulted and following.Remains confused this mrng.   Palliative care has been asked to get involved for further [place] conversations.   Today's Discussion 04/16/2022  *Please note that this is a verbal dictation therefore any spelling or grammatical errors are due to the "[place]" system interpretation.  Chart reviewed inclusive of vital signs, progress notes, laboratory results, and diagnostic images.   I met with Roberta Mercer at bedside this morning. She was resting comfortable in NAD. She greeted me appropriately when I stopped by her bedside. But was unable to carry on a conversations. Per patients RN she has had some small sips of shakes but nothing significant in terms of intake.   I spoke to patients friend, Roberta Mercer. I shared my worry in terms of patient ongoing poor nutritional state. We agreed that a calorie count may help to further determine goals moving forward. She requests come insights from speech regarding her cognitive/linguistic function. I shared that I would reach out to speech for further conversations with the,  For now plan for ongoing care. If things did decline patients friends are open to hospice though this would also need to be discussed with patient lawyer,  Roberta Mercer as prior Roberta Mercer has relinquished his role.   Questions and concerns addressed/ Palliative Support Provided.   Objective Assessment: Vital Signs Vitals:   04/16/22 0732 04/16/22 1123  BP: (!) 158/75 (!) 133/107  Pulse: 95 83  Resp: 18 20  Temp: 98.6 F (37 C) 98.3 F (36.8 C)  SpO2: 94% 97%    Intake/Output Summary (Last 24 hours) at 04/16/2022 1217 Last data filed at 04/16/2022 0800 Gross per 24 hour  Intake 1117.05 ml  Output --  Net 1117.05 ml    Last Weight  Most recent update: 04/12/2022 12:53 PM    Weight  60 kg (132 lb 4.4 oz)            Gen: Frail elderly Caucasian female in no acute distress HEENT: moist mucous membranes CV: Regular rate and rhythm PULM: On RA, breathing is even and nonlabored ABD: soft/nontender  EXT: No edema Neuro: Alert, able to say a few words that can be understood  SUMMARY OF RECOMMENDATIONS   DNAR/DNI   Allow time for outcomes  Appreciate nutrition initiating a calorie count to help guide further [place] conversations   Appreciate physical therapy occupational therapy and speech therapy involvement   Discussed the idea of hospice if patient's condition worsens or lacks in improvement  Patients prior HCPOA, Roberta Mercer has relinquished his role --> Will try to get in touch with patient lawyer Roberta Mercer for further guidance and obtain a copy of ADs though patients friends, Roberta Mercer and Roberta Mercer have been making decisions for her to date  Ongoing palliative support  Billing based on MDM: HIgh ______________________________________________________________________________________ Roberta Mercer Team Team Cell Phone: [phone] Please utilize  secure chat with additional questions, if there is no response within 30 minutes please call the above phone number  Palliative Medicine Team providers are available by phone from 7am to 7pm daily and can be reached through the team cell phone.   Should this patient require assistance outside of these hours, please call the patient's attending physician.

## 2022-04-16 NOTE — Progress Notes (Signed)
Pharmacy Antibiotic Note  Roberta Mercer is a 81 y.o. female admitted on 04/11/2022 with  stroke .  Pharmacy has been consulted for Unasyn dosing for aspiration pneumonia. CXR with possible multifocal pneumonia. She is afebrile, WBC are normal, and renal function is normal.  Plan: Unasyn 1.5 g IV q6h Monitor renal function and clinical progress F/U LOT   Height: 5\' 6"  (167.6 cm) Weight: 60 kg (132 lb 4.4 oz) IBW/kg (Calculated) : 59.3  Temp (24hrs), Avg:98.2 F (36.8 C), Min:97.8 F (36.6 C), Max:98.9 F (37.2 C)  Recent Labs  Lab 04/11/22 1758 04/11/22 1809 04/11/22 2034 04/12/22 0315 04/13/22 0435 04/13/22 0535 04/14/22 0242 04/15/22 0311  WBC 8.3  --   --  8.5  --  5.0  --   --   CREATININE 0.91   < > 0.80 0.91 0.73  --  0.79 0.79   < > = values in this interval not displayed.    Estimated Creatinine Clearance: 52.5 mL/min (by C-G formula based on SCr of 0.79 mg/dL).    Allergies  Allergen Reactions   Bactrim [Sulfamethoxazole-Trimethoprim] Other (See Comments)    Unknown reaction    Diovan [Valsartan] Other (See Comments)    Unknown reaction   Sulfa Antibiotics Other (See Comments)    Listed on MAR Unknown reaction    Thank you for involving pharmacy in this patient's care.  04/17/22, PharmD, BCPS Clinical Pharmacist Clinical phone for 04/16/2022 is [phone] 04/16/2022 4:53 PM

## 2022-04-17 DIAGNOSIS — I639 Cerebral infarction, unspecified: Secondary | ICD-10-CM | POA: Diagnosis not present

## 2022-04-17 DIAGNOSIS — Z515 Encounter for palliative care: Secondary | ICD-10-CM | POA: Diagnosis not present

## 2022-04-17 DIAGNOSIS — E46 Unspecified protein-calorie malnutrition: Secondary | ICD-10-CM | POA: Diagnosis not present

## 2022-04-17 DIAGNOSIS — E8809 Other disorders of plasma-protein metabolism, not elsewhere classified: Secondary | ICD-10-CM | POA: Diagnosis not present

## 2022-04-17 LAB — CBC
HCT: 28.6 % — ABNORMAL LOW (ref 36.0–46.0)
Hemoglobin: 9 g/dL — ABNORMAL LOW (ref 12.0–15.0)
MCH: 27.4 pg (ref 26.0–34.0)
MCHC: 31.5 g/dL (ref 30.0–36.0)
MCV: 86.9 fL (ref 80.0–100.0)
Platelets: 117 10*3/uL — ABNORMAL LOW (ref 150–400)
RBC: 3.29 MIL/uL — ABNORMAL LOW (ref 3.87–5.11)
RDW: 17.6 % — ABNORMAL HIGH (ref 11.5–15.5)
WBC: 6.6 10*3/uL (ref 4.0–10.5)
nRBC: 0 % (ref 0.0–0.2)

## 2022-04-17 LAB — BASIC METABOLIC PANEL
Anion gap: 10 (ref 5–15)
BUN: 10 mg/dL (ref 8–23)
CO2: 18 mmol/L — ABNORMAL LOW (ref 22–32)
Calcium: 8.4 mg/dL — ABNORMAL LOW (ref 8.9–10.3)
Chloride: 112 mmol/L — ABNORMAL HIGH (ref 98–111)
Creatinine, Ser: 0.73 mg/dL (ref 0.44–1.00)
GFR, Estimated: >60 mL/min (ref >60)
Glucose, Bld: 104 mg/dL — ABNORMAL HIGH (ref 70–99)
Potassium: 3.4 mmol/L — ABNORMAL LOW (ref 3.5–5.1)
Sodium: 140 mmol/L (ref 135–145)

## 2022-04-17 MED ORDER — AMOXICILLIN-POT CLAVULANATE 875-125 MG PO TABS
1.0000 | ORAL_TABLET | Freq: Two times a day (BID) | ORAL | Status: DC
  Administered 2022-04-17 – 2022-04-19 (×3): 1 via ORAL
  Filled 2022-04-17 (×6): qty 1

## 2022-04-17 MED ORDER — POTASSIUM CHLORIDE 10 MEQ/100ML IV SOLN
10.0000 meq | INTRAVENOUS | Status: AC
  Administered 2022-04-17 (×4): 10 meq via INTRAVENOUS
  Filled 2022-04-17 (×4): qty 100

## 2022-04-17 MED ORDER — SODIUM BICARBONATE 650 MG PO TABS
650.0000 mg | ORAL_TABLET | Freq: Three times a day (TID) | ORAL | Status: DC
  Administered 2022-04-17 – 2022-04-19 (×4): 650 mg via ORAL
  Filled 2022-04-17 (×7): qty 1

## 2022-04-17 NOTE — Progress Notes (Signed)
Speech Language Pathology Treatment: Dysphagia  Patient Details Name: Roberta Mercer MRN: [id number] DOB: 23-Feb-1941 Today's Date: 04/17/2022 Time: 7106-2694 SLP Time Calculation (min) (ACUTE ONLY): 35 min  Assessment / Plan / Recommendation Clinical Impression  Pt demonstrates tolerance of oral intake with ice cream and ensure. RN reports she has been able to get most meds to pt if they are crushed in liquid. She prefers sweets and drinks. Initially when SLP offered pt water pt was orally defensive and became agitated. Later she allowed herself to be fed. Could not encourage hand over hand feeding and pt was distracted by pain at IV site (getting a potassium run). Overall, expect her intake to be limited as she is difficult to redirect and can't participate in verbal reasoning given severity of aphasia.   HPI HPI: Patient is a 82 year old female with past medical history of DVT on Coumadin, hypertension, hyperlipidemia, stroke with right-sided residual weakness, GERD who presents from skilled nursing facility for further evaluation of left-sided weakness.  She was noted to have left-sided weakness, difficulty speaking at nursing facility.  Patient is wheelchair-bound at baseline.  On admission ,she was mildly hypertensive.   CT angio head and neck showed a new focal occlusion or high-grade stenosis of the left M2 segment, severe chronic microvascular ischemic disease, chronic infarcts, suspicious for acute infarct in the left insular cortex.  MRI pending. EEG impression : mild to moderate diffuse encephalopathy, nonspecific etiology. No seizures or epileptiform discharges were seen throughout the recording." Per MD notes, palliative consult requested, pt DNR with poor quality of life.      SLP Plan  Continue with current plan of care  Patient needs continued Speech Lanaguage Pathology Services   Recommendations for follow up therapy are one component of a multi-disciplinary discharge planning  process, led by the attending physician.  Recommendations may be updated based on patient status, additional functional criteria and insurance authorization.    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

## 2022-04-17 NOTE — Progress Notes (Signed)
Pt is is alert and awake, refusing any of her medicines.She keeps saying "no", hard to direct and does not follow commands.

## 2022-04-17 NOTE — Plan of Care (Signed)
  Problem: Education: Goal: Knowledge of disease or condition will improve Outcome: Not Progressing   Problem: Health Behavior/Discharge Planning: Goal: Ability to manage health-related needs will improve Outcome: Not Progressing   Problem: Nutrition: Goal: Dietary intake will improve Outcome: Not Progressing

## 2022-04-17 NOTE — Plan of Care (Signed)
  Problem: Education: Goal: Knowledge of disease or condition will improve Outcome: Progressing Goal: Knowledge of secondary prevention will improve (MUST DOCUMENT ALL) Outcome: Progressing Goal: Knowledge of patient specific risk factors will improve Elta [place] N/A or DELETE if not current risk factor) Outcome: Progressing   Problem: Nutrition: Goal: Dietary intake will improve Outcome: Progressing   Problem: Skin Integrity: Goal: Risk for impaired skin integrity will decrease Outcome: Progressing

## 2022-04-17 NOTE — Evaluation (Addendum)
Speech Language Pathology Evaluation Patient Details Name: Roberta Mercer MRN: [id number] DOB: Aug 13, 1940 Today's Date: 04/17/2022 Time: 4742-5956 SLP Time Calculation (min) (ACUTE ONLY): 1355 min  Problem List:  Patient Active Problem List   Diagnosis Date Noted   Iron deficiency anemia 04/12/2022   Hypoalbuminemia due to protein-calorie malnutrition ([place]) 04/12/2022   GERD (gastroesophageal reflux disease) 04/12/2022   Acute ischemic stroke ([place]) 04/11/2022   Moderate aortic stenosis 01/06/2021   SOB (shortness of breath) 01/06/2021   Moderate aortic regurgitation 01/06/2021   History of CVA (cerebrovascular accident) 05/11/2020   CKD (chronic kidney disease)    DVT (deep venous thrombosis) (HCC)    Stroke (HCC)    TIA (transient ischemic attack)    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   Nonrheumatic aortic valve stenosis 06/05/2019   LVH (left ventricular hypertrophy) 38/75/6433   Diastolic dysfunction 29/51/8841   Stage 3a chronic kidney disease ([place]) 06/05/2019   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   Essential hypertension 03/03/2019   Post-menopausal atrophic vaginitis    Mixed hyperlipidemia    History of venous thromboembolism    Essential tremor    Esophageal spasm    History of stroke 11/14/2014   Past Medical History:  Past Medical History:  Diagnosis Date   CKD (chronic kidney disease)    Diastolic dysfunction 6/60/6301   DVT (deep venous thrombosis) (HCC)    Esophageal spasm    Essential hypertension 03/03/2019   Essential tremor    History of CVA (cerebrovascular accident) 05/11/2020   History of stroke 11/14/2014   History of venous thromboembolism    Hyperlipidemia    LVH (left ventricular hypertrophy) 06/05/2019   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   Nonrheumatic aortic valve stenosis 06/05/2019   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   Post-menopausal atrophic vaginitis    Stage 3a chronic  kidney disease ([place]) 06/05/2019   Stroke ([place])    TIA (transient ischemic attack)    Past Surgical History:  Past Surgical History:  Procedure Laterality Date   CATARACT EXTRACTION     ELBOW SURGERY Left    to repair nerve/ not successful   TRIGGER FINGER RELEASE     HPI:  Patient is a 82 year old female with past medical history of DVT on Coumadin, hypertension, hyperlipidemia, stroke with right-sided residual weakness, GERD who presents from skilled nursing facility for further evaluation of left-sided weakness.  She was noted to have left-sided weakness, difficulty speaking at nursing facility.  Patient is wheelchair-bound at baseline.  On admission ,she was mildly hypertensive.   CT angio head and neck showed a new focal occlusion or high-grade stenosis of the left M2 segment, severe chronic microvascular ischemic disease, chronic infarcts, suspicious for acute infarct in the left insular cortex.  MRI pending. EEG impression : mild to moderate diffuse encephalopathy, nonspecific etiology. No seizures or epileptiform discharges were seen throughout the recording." Per MD notes, palliative consult requested, pt DNR with poor quality of life.   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 She is able to respond Y/N to prosody, but without accuracy. Pts language is mostly fluent jargon with prosodic features. Pt becomes easily frusterated by listeners lack of comprehension; she has no awareness of her impairment. She does follow some context with feeding and self care. She is defensive and difficult to direct. Does not respond to cueing or MIT strategies. Recommend f/u at SNF level though prognosis for improved communication of wants  and needs is generally poor given severity of deficits and low level of function at baseline. Attempted to call pts friend Amy, no response.     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  Recommendations may be updated based on patient status, additional functional criteria and insurance authorization.    Follow Up Recommendations  Skilled nursing-short term rehab (<3 hours/day)    Assistance Recommended at Discharge  Frequent or constant Supervision/Assistance  Functional Status Assessment Patient has had a recent decline in their functional status and/or demonstrates limited ability to make significant improvements in function in a reasonable and predictable amount of time  Frequency and Duration min 2x/week  2 weeks      SLP Evaluation Cognition  Overall Cognitive Status: History of cognitive impairments - at baseline Arousal/Alertness: Awake/alert Orientation Level: Other (comment) (unable to assess due to aphasia)       Comprehension  Auditory Comprehension Overall Auditory Comprehension: Impaired Yes/No Questions: Impaired Basic Biographical Questions: 0-25% accurate Commands: Impaired Conversation: Simple    Expression Verbal Expression Overall Verbal Expression: Impaired Initiation: No impairment Automatic Speech:  (none) Level of Generative/Spontaneous Verbalization: Word Repetition: Impaired Level of Impairment: Word level Naming: Impairment Responsive: Not tested Confrontation: Impaired Convergent: Not tested Verbal Errors: Not aware of errors;Jargon Pragmatics: Impairment Impairments: Interpretation of nonverbal communication   Oral / Motor  Oral Motor/Sensory Function Overall Oral Motor/Sensory Function: Within functional limits Motor Speech Overall Motor Speech: Other (comment) (Clear Yes/no otherwise language too impaired)            Nevan Creighton, Katherene Ponto 04/17/2022,  3:50 PM

## 2022-04-17 NOTE — Progress Notes (Signed)
PROGRESS NOTE  Roberta Mercer  WJX:[id number] DOB: December 13, 1940 DOA: 04/11/2022 PCP: Mateo Flow, MD   Brief Narrative: Patient is a 82 year old female with past medical history of DVT on Coumadin, hypertension, hyperlipidemia, stroke with right-sided residual weakness, GERD who presents from skilled nursing facility for further evaluation of left-sided weakness.  She was noted to have left-sided weakness, difficulty speaking at nursing facility.  Patient is wheelchair-bound at baseline.  On admission ,she was mildly hypertensive.   CT angio head and neck showed a new focal occlusion or high-grade stenosis of the left M2 segment, severe chronic microvascular ischemic disease, chronic infarcts, suspicious for acute infarct in the left insular cortex.  MRI confirmed stroke.  Neurology consulted and were following.PT/OT recommending long-term care.  Palliative care also following because of persistent altered mental status ,poor oral intake.  Currently on calorie count.  Assessment & Plan:  Principal Problem:   Acute ischemic stroke [facility]) Active Problems:   Mixed hyperlipidemia   Essential hypertension   DVT (deep venous thrombosis) (HCC)   Iron deficiency anemia   Hypoalbuminemia due to protein-calorie malnutrition (HCC)   GERD (gastroesophageal reflux disease)  Acute ischemic stroke: Presented from nursing facility with left-sided weakness, slurred speech.  History of prior stroke with right-sided weakness.  CT imaging suspicious for acute infarct.  MRI showed moderate-sized early subacute infarct in the left MCA distribution without hemorrhage or mass effect.. Neurology was  following. Continue statin.  LDL of 44, A1c of 5.1.  PT/speech/OT evaluation done.  Started on Eliquis  Altered mental status: Most likely from acute metabolic encephalopathy from stroke.  EEG showed mild to moderate diffuse encephalopathy, nonspecific allergy, no seizures or epileptiform discharges.  Monitor mentation.   Delirium precautions.  As per her family friend Elspeth Cho, she is most of the time confused at baseline.On dsyphagia 3 diet, but she is barely eating.  Started on gentle IV fluids. Her mentation has improved .  She is alert and awake but remains confused.  Has very poor oral intake.  On calorie count  Suspicion for aspiration pneumonia: Noted to have cough.  She is saturating fine on room air.  Not dyspneic.  X-ray showed possible multifocal pneumonia.  Could be aspiration.  Started on Augmentin.  Lungs are clear on auscultation.  Hypertension: Home meds restarted .  On amlodipine, lisinopril  History of DVT: On warfarin at home, now switched to Eliquis  Iron deficient anemia: Continue iron supplementation  Hypokalemia/non-anion gap metabolic disease: Potassium being supplemented.  Started on bicarb tablet  GERD: Continue Protonix  Debility/deconditioning/goals of care: History of prior stroke.  Wheelchair-bound.  Confused at baseline.Lives at nursing facility.  Now with new stroke.  Very poor quality of life.  We have requested palliative care consultation regarding goals of care discussion.  CODE STATUS is DNR.  Patient has very poor intake.      Pressure Injury 04/13/22 Coccyx Mid Stage 3 -  Full thickness tissue loss. Subcutaneous fat may be visible but bone, tendon or muscle are NOT exposed. (Active)  04/13/22 2000  Location: Coccyx  Location Orientation: Mid  Staging: Stage 3 -  Full thickness tissue loss. Subcutaneous fat may be visible but bone, tendon or muscle are NOT exposed.  Wound Description (Comments):   Present on Admission: Yes  Dressing Type Other (Comment) 04/17/22 0800    DVT prophylaxis:SCD's Start: 04/12/22 0218 apixaban (ELIQUIS) tablet 5 mg     Code Status: DNR  Family Communication: Called and discussed with friend Amy Hardin Negus  on phone on 12/27.palliative care discussing with friend  Patient status:Inpatient  Patient is from :SNF  Anticipated  discharge to:SNF?  Estimated DC date:not sure,TOC following for placement, also evaluating the hospital course, clinical outcome of the patient, she has very poor oral intake  Consultants: Neurology  Procedures:None  Antimicrobials:  Anti-infectives (From admission, onward)    Start     Dose/Rate Route Frequency Ordered Stop   04/17/22 1230  amoxicillin-clavulanate (AUGMENTIN) 875-125 MG per tablet 1 tablet        1 tablet Oral Every 12 hours 04/17/22 1138 04/22/22 0959   04/16/22 1800  ampicillin-sulbactam (UNASYN) 1.5 g in sodium chloride 0.9 % 100 mL IVPB  Status:  Discontinued        1.5 g 200 mL/hr over 30 Minutes Intravenous Every 6 hours 04/16/22 1652 04/17/22 1138       Subjective: Patient seen and examined at bedside today.  Hemodynamically stable.  Comfortable, on room air.  Alert and awake but not oriented.  Remains confused.  Continues to have very poor oral intake.  Objective: Vitals:   04/17/22 0006 04/17/22 0401 04/17/22 0816 04/17/22 1100  BP: (!) 151/62 (!) 158/65 (!) 142/84 128/76  Pulse: 88 87 82   Resp: 16 19 16 16   Temp: 98.2 F (36.8 C) 98.9 F (37.2 C) 99.3 F (37.4 C) 98.3 F (36.8 C)  TempSrc: Oral Oral Oral Oral  SpO2: 94% 96% 96% 98%  Weight:      Height:        Intake/Output Summary (Last 24 hours) at 04/17/2022 1139 Last data filed at 04/17/2022 0800 Gross per 24 hour  Intake 2811.59 ml  Output 1250 ml  Net 1561.59 ml   Filed Weights   04/11/22 1800 04/12/22 1253  Weight: 59.8 kg 60 kg    Examination:  General exam: Overall comfortable, not in distress HEENT: PERRL Respiratory system:  no wheezes or crackles  Cardiovascular system: S1 & S2 heard, RRR.  Gastrointestinal system: Abdomen is nondistended, soft and nontender. Central nervous system: Alert and awake, confused, does not follow command Extremities: No edema, no clubbing ,no cyanosis Skin: No rashes, no ulcers,no icterus     Data Reviewed: I have personally reviewed  following labs and imaging studies  CBC: Recent Labs  Lab 04/11/22 1758 04/11/22 1809 04/11/22 2034 04/12/22 0315 04/13/22 0535 04/17/22 0301  WBC 8.3  --   --  8.5 5.0 6.6  NEUTROABS 4.3  --   --   --   --   --   HGB 10.4* 11.6* 10.5* 10.9* 9.7* 9.0*  HCT 35.6* 34.0* 31.0* 35.7* 32.7* 28.6*  MCV 90.1  --   --  88.4 90.8 86.9  PLT 199  --   --  201 134* 117*   Basic Metabolic Panel: Recent Labs  Lab 04/11/22 2030 04/11/22 2034 04/12/22 0315 04/13/22 0435 04/14/22 0242 04/15/22 0311 04/17/22 0301  NA  --    < > 137 140 139 138 140  K  --    < > 3.8 3.3* 5.3*  5.2* 4.0 3.4*  CL  --    < > 107 119* 113* 109 112*  CO2  --   --  25 17* 20* 20* 18*  GLUCOSE  --    < > 83 65* 71 91 104*  BUN  --    < > 11 9 11 11 10   CREATININE  --    < > 0.91 0.73 0.79 0.79 0.73  CALCIUM  --   --  9.3 7.3* 9.2 9.3 8.4*  MG 1.7  --  1.8  --   --   --   --   PHOS 2.7  --   --   --   --   --   --    < > = values in this interval not displayed.     Recent Results (from the past 240 hour(s))  Urine Culture     Status: Abnormal   Collection Time: 04/11/22  8:16 PM   Specimen: Urine, Catheterized  Result Value Ref Range Status   Specimen Description URINE, CATHETERIZED  Final   Special Requests   Final    NONE Performed at [facility], [address] [place], [place] [address]    Culture MULTIPLE SPECIES PRESENT, SUGGEST RECOLLECTION (A)  Final   Report Status 04/12/2022 FINAL  Final     Radiology Studies: DG CHEST PORT 1 VIEW  Result Date: 04/16/2022 CLINICAL DATA:  Cough EXAM: PORTABLE CHEST 1 VIEW COMPARISON:  Previous studies including the examination of 04/11/2022 FINDINGS: Transverse diameter of heart is increased. There is increased density in the medial left lower lung field. Small patchy infiltrates are seen in right parahilar region. There are new patchy densities in right lower lung fields. Low position of diaphragms suggests COPD. There is no significant pleural  effusion or pneumothorax. IMPRESSION: There are patchy infiltrates in right parahilar region and right lower lung field with interval worsening suggesting possible multifocal pneumonia. Increased opacity in the medial left lower lung field most likely is due to fixed hiatal hernia. Low position of diaphragms suggests COPD. Electronically Signed   By: Elmer Picker M.D.   On: 04/16/2022 16:46    Scheduled Meds:  amLODipine  10 mg Oral q AM   amoxicillin-clavulanate  1 tablet Oral Q12H   apixaban  5 mg Oral BID   cyanocobalamin  500 mcg Oral q AM   feeding supplement  237 mL Oral BID BM   ferrous sulfate  325 mg Oral QODAY   lisinopril  10 mg Oral q AM   LORazepam  1 mg Intravenous Once   magnesium oxide  400 mg Oral Daily   melatonin  5 mg Oral QHS   pantoprazole  40 mg Oral BID   polyethylene glycol powder  17 g Oral q AM   rosuvastatin  20 mg Oral QHS   senna  8.6 mg Oral q AM   sodium bicarbonate  650 mg Oral TID   thiamine  100 mg Oral q AM   topiramate  25 mg Oral q AM   Continuous Infusions:  dextrose 5 % and 0.9% NaCl Stopped (04/17/22 0552)   potassium chloride 10 mEq (04/17/22 1010)     LOS: 6 days   Shelly Coss, MD [facility] P1/04/2022, 11:39 AM

## 2022-04-17 NOTE — Care Management Important Message (Signed)
Important Message  Patient Details  Name: Roberta Mercer MRN: [id number] Date of Birth: 04/03/1941   Medicare Important Message Given:  Yes     Hannah Beat 04/17/2022, 2:47 PM

## 2022-04-17 NOTE — Progress Notes (Signed)
Daily Progress Note   Patient Name: Roberta Mercer       Date: 04/17/2022 DOB: 24-Mar-1941  Age: 82 y.o. MRN#: [id number] Attending Physician: Shelly Coss, MD Primary Care Physician: Mateo Flow, MD Admit Date: 04/11/2022  Reason for Consultation/Follow-up: Establishing goals of care  Patient Profile/HPI: Patient is a 82 year old female with past medical history of DVT on Coumadin, hypertension, hyperlipidemia, stroke with right-sided residual weakness, GERD who presents from skilled nursing facility for further evaluation of left-sided weakness.  She was noted to have left-sided weakness, difficulty speaking at nursing facility.  Patient is wheelchair-bound at baseline.  On admission ,she was mildly hypertensive.   CT angio head and neck showed a new focal occlusion or high-grade stenosis of the left M2 segment, severe chronic microvascular ischemic disease, chronic infarcts, suspicious for acute infarct in the left insular cortex.  MRI confirmed stroke.  Neurology consulted and following.Remains confused this mrng. There is suspicion for aspiration pneumonia.  Palliative care has been asked to get involved for further [place] conversations.   Subjective: Chart reviewed including labs, progress notes, imaging from this and previous encounters.  Ongoing calorie count.  Patient does not open eyes to my voice. Appears comfortable. Lunch tray at bedside is untouched. No family at bedside.   Review of Systems  Unable to perform ROS: Mental status change     Physical Exam Vitals and nursing note reviewed.  Constitutional:      Appearance: She is ill-appearing.     Comments: frail  Cardiovascular:     Rate and Rhythm: Normal rate.             Vital Signs: BP 128/76 (BP Location: Right Arm)    Pulse 82   Temp 98.3 F (36.8 C) (Oral)   Resp 16   Ht 5\' 6"  (1.676 m)   Wt 60 kg   SpO2 98%   BMI 21.35 kg/m  SpO2: SpO2: 98 % O2 Device: O2 Device: Room Air O2 Flow Rate:    Intake/output summary:  Intake/Output Summary (Last 24 hours) at 04/17/2022 1428 Last data filed at 04/17/2022 0800 Gross per 24 hour  Intake 2451.59 ml  Output 1250 ml  Net 1201.59 ml   LBM: Last BM Date : 04/17/23 Baseline Weight: Weight: 59.8 kg Most recent weight: Weight: 60 kg  Palliative Assessment/Data: PPS: 20%      Patient Active Problem List   Diagnosis Date Noted   Iron deficiency anemia 04/12/2022   Hypoalbuminemia due to protein-calorie malnutrition ([place]) 04/12/2022   GERD (gastroesophageal reflux disease) 04/12/2022   Acute ischemic stroke ([place]) 04/11/2022   Moderate aortic stenosis 01/06/2021   SOB (shortness of breath) 01/06/2021   Moderate aortic regurgitation 01/06/2021   History of CVA (cerebrovascular accident) 05/11/2020   CKD (chronic kidney disease)    DVT (deep venous thrombosis) (HCC)    Stroke (HCC)    TIA (transient ischemic attack)    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   Nonrheumatic aortic valve stenosis 06/05/2019   LVH (left ventricular hypertrophy) 44/31/5400   Diastolic dysfunction 86/76/1950   Stage 3a chronic kidney disease ([place]) 06/05/2019   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   Essential hypertension 03/03/2019   Post-menopausal atrophic vaginitis    Mixed hyperlipidemia    History of venous thromboembolism    Essential tremor    Esophageal spasm    History of stroke 11/14/2014    Palliative Care Assessment & Plan    Assessment/Recommendations/Plan  Continue current care DNR- limited interventions PMT will continue to follow and discuss GOC if patient has decline   Code Status: DNR  Prognosis:  Unable to determine  Discharge Planning: To Be Determined   Thank you for allowing the Palliative  Medicine Team to assist in the care of this patient.   Greater than 50%  of this time was spent counseling and coordinating care related to the above assessment and plan.  Mariana Kaufman, AGNP-C Palliative Medicine   Please contact Palliative Medicine Team phone at [phone] for questions and concerns.

## 2022-04-18 DIAGNOSIS — I639 Cerebral infarction, unspecified: Secondary | ICD-10-CM | POA: Diagnosis not present

## 2022-04-18 LAB — BASIC METABOLIC PANEL
Anion gap: 10 (ref 5–15)
BUN: 10 mg/dL (ref 8–23)
CO2: 20 mmol/L — ABNORMAL LOW (ref 22–32)
Calcium: 8.5 mg/dL — ABNORMAL LOW (ref 8.9–10.3)
Chloride: 111 mmol/L (ref 98–111)
Creatinine, Ser: 0.64 mg/dL (ref 0.44–1.00)
GFR, Estimated: >60 mL/min (ref >60)
Glucose, Bld: 96 mg/dL (ref 70–99)
Potassium: 3.5 mmol/L (ref 3.5–5.1)
Sodium: 141 mmol/L (ref 135–145)

## 2022-04-18 MED ORDER — POTASSIUM CHLORIDE CRYS ER 20 MEQ PO TBCR
40.0000 meq | EXTENDED_RELEASE_TABLET | Freq: Once | ORAL | Status: AC
  Administered 2022-04-18: 40 meq via ORAL
  Filled 2022-04-18: qty 2

## 2022-04-18 NOTE — Progress Notes (Signed)
PROGRESS NOTE  Roberta Mercer  VQM:[id number] DOB: 1940-05-28 DOA: 04/11/2022 PCP: Mateo Flow, MD   Brief Narrative: Patient is a 82 year old female with past medical history of DVT on Coumadin, hypertension, hyperlipidemia, stroke with right-sided residual weakness, GERD who presents from skilled nursing facility for further evaluation of left-sided weakness.  She was noted to have left-sided weakness, difficulty speaking at nursing facility.  Patient is wheelchair-bound at baseline.  On admission ,she was mildly hypertensive.   CT angio head and neck showed a new focal occlusion or high-grade stenosis of the left M2 segment, severe chronic microvascular ischemic disease, chronic infarcts, suspicious for acute infarct in the left insular cortex.  MRI confirmed stroke.  Neurology consulted and were following.PT/OT recommending long-term care.  Palliative care also following because of persistent altered mental status ,poor oral intake.  Currently on calorie count.  Assessment & Plan:  Principal Problem:   Acute ischemic stroke [facility]) Active Problems:   Mixed hyperlipidemia   Essential hypertension   DVT (deep venous thrombosis) (HCC)   Iron deficiency anemia   Hypoalbuminemia due to protein-calorie malnutrition (HCC)   GERD (gastroesophageal reflux disease)  Acute ischemic stroke: Presented from nursing facility with left-sided weakness, slurred speech.  History of prior stroke with right-sided weakness.  CT imaging suspicious for acute infarct.  MRI showed moderate-sized early subacute infarct in the left MCA distribution without hemorrhage or mass effect.. Neurology was  following. Continue statin.  LDL of 44, A1c of 5.1.  PT/speech/OT evaluation done.  Started on Eliquis  Altered mental status: Most likely from acute metabolic encephalopathy from stroke.  History of dementia.  EEG showed mild to moderate diffuse encephalopathy, nonspecific allergy, no seizures or epileptiform discharges.   Monitor mentation.  Delirium precautions.  As per her family friend Elspeth Cho, she is most of the time confused at baseline.On dsyphagia 3 diet, but she is barely eating.  Started on gentle IV fluids. Her mentation has improved .  She is alert and awake but remains confused.  Has very poor oral intake.  On calorie count  Suspicion for aspiration pneumonia: Noted to have cough few days ago.  She is saturating fine on room air.  Not dyspneic.  X-ray showed possible multifocal pneumonia.  Could be aspiration.  Started on Augmentin.  Lungs are clear on auscultation.  Hypertension: Home meds restarted .  On amlodipine, lisinopril  History of DVT: On warfarin at home, now switched to Eliquis  Iron deficient anemia: Continue iron supplementation  Hypokalemia/non-anion gap metabolic disease: Potassium  supplemented.  Started on bicarb tablet  GERD: Continue Protonix  Debility/deconditioning/goals of care: History of prior stroke.  Wheelchair-bound.  Confused at baseline.Lives at nursing facility.  Now with new stroke.  Very poor quality of life.  We have requested palliative care consultation regarding goals of care discussion.  CODE STATUS is DNR.  Patient has very poor intake.  Palliative care closely following before deciding on discharge disposition.  PT/OT have recommended long-term placement in a facility.TOC following     Pressure Injury 04/13/22 Coccyx Mid Stage 3 -  Full thickness tissue loss. Subcutaneous fat may be visible but bone, tendon or muscle are NOT exposed. (Active)  04/13/22 2000  Location: Coccyx  Location Orientation: Mid  Staging: Stage 3 -  Full thickness tissue loss. Subcutaneous fat may be visible but bone, tendon or muscle are NOT exposed.  Wound Description (Comments):   Present on Admission: Yes  Dressing Type Foam - Lift dressing to assess site every  shift 04/17/22 1959    DVT prophylaxis:SCD's Start: 04/12/22 0218 apixaban (ELIQUIS) tablet 5 mg     Code  Status: DNR  Family Communication: Called and discussed with friend Elspeth Cho on phone on 12/27  Patient status:Inpatient  Patient is from :SNF  Anticipated discharge to:SNF?  Estimated DC date:not sure,TOC following for placement, also evaluating the hospital course, clinical outcome of the patient by palliative care, she has very poor oral intake  Consultants: Neurology, palliative care  Procedures:None  Antimicrobials:  Anti-infectives (From admission, onward)    Start     Dose/Rate Route Frequency Ordered Stop   04/17/22 1230  amoxicillin-clavulanate (AUGMENTIN) 875-125 MG per tablet 1 tablet        1 tablet Oral Every 12 hours 04/17/22 1138 04/22/22 0959   04/16/22 1800  ampicillin-sulbactam (UNASYN) 1.5 g in sodium chloride 0.9 % 100 mL IVPB  Status:  Discontinued        1.5 g 200 mL/hr over 30 Minutes Intravenous Every 6 hours 04/16/22 1652 04/17/22 1138       Subjective: Patient seen and examined at bedside today.  Hemodynamically stable .  Lying in bed.  Not in any distress.  Alert and awake but does not speak much and just states he has.  Does not follow commands .continues to have poor oral intake  Objective: Vitals:   04/17/22 1949 04/18/22 0057 04/18/22 0336 04/18/22 0848  BP: (!) 170/68 (!) 152/70 (!) 148/70 (!) 163/58  Pulse: 85 86 82 86  Resp: 16 16 20 17   Temp: 98.5 F (36.9 C) 97.9 F (36.6 C) 98.5 F (36.9 C) 98.9 F (37.2 C)  TempSrc: Oral Axillary Axillary Axillary  SpO2: 92% 95% 98% 93%  Weight:      Height:        Intake/Output Summary (Last 24 hours) at 04/18/2022 1136 Last data filed at 04/18/2022 0343 Gross per 24 hour  Intake 0 ml  Output 300 ml  Net -300 ml   Filed Weights   04/11/22 1800 04/12/22 1253  Weight: 59.8 kg 60 kg    Examination:   General exam: Overall comfortable, not in distress HEENT: PERRL Respiratory system:  no wheezes or crackles  Cardiovascular system: S1 & S2 heard, RRR.  Gastrointestinal system:  Abdomen is nondistended, soft and nontender. Central nervous system: Alert and awake but not oriented Extremities: No edema, no clubbing ,no cyanosis Skin: No rashes, no ulcers,no icterus     Data Reviewed: I have personally reviewed following labs and imaging studies  CBC: Recent Labs  Lab 04/11/22 1758 04/11/22 1809 04/11/22 2034 04/12/22 0315 04/13/22 0535 04/17/22 0301  WBC 8.3  --   --  8.5 5.0 6.6  NEUTROABS 4.3  --   --   --   --   --   HGB 10.4* 11.6* 10.5* 10.9* 9.7* 9.0*  HCT 35.6* 34.0* 31.0* 35.7* 32.7* 28.6*  MCV 90.1  --   --  88.4 90.8 86.9  PLT 199  --   --  201 134* 505*   Basic Metabolic Panel: Recent Labs  Lab 04/11/22 2030 04/11/22 2034 04/12/22 0315 04/13/22 0435 04/14/22 0242 04/15/22 0311 04/17/22 0301 04/18/22 0557  NA  --    < > 137 140 139 138 140 141  K  --    < > 3.8 3.3* 5.3*  5.2* 4.0 3.4* 3.5  CL  --    < > 107 119* 113* 109 112* 111  CO2  --   --  25 17* 20* 20* 18* 20*  GLUCOSE  --    < > 83 65* 71 91 104* 96  BUN  --    < > 11 9 11 11 10 10   CREATININE  --    < > 0.91 0.73 0.79 0.79 0.73 0.64  CALCIUM  --   --  9.3 7.3* 9.2 9.3 8.4* 8.5*  MG 1.7  --  1.8  --   --   --   --   --   PHOS 2.7  --   --   --   --   --   --   --    < > = values in this interval not displayed.     Recent Results (from the past 240 hour(s))  Urine Culture     Status: Abnormal   Collection Time: 04/11/22  8:16 PM   Specimen: Urine, Catheterized  Result Value Ref Range Status   Specimen Description URINE, CATHETERIZED  Final   Special Requests   Final    NONE Performed at [facility], [address] [place], [place]    Culture MULTIPLE SPECIES PRESENT, SUGGEST RECOLLECTION (A)  Final   Report Status 04/12/2022 FINAL  Final     Radiology Studies: DG CHEST PORT 1 VIEW  Result Date: 04/16/2022 CLINICAL DATA:  Cough EXAM: PORTABLE CHEST 1 VIEW COMPARISON:  Previous studies including the examination of 04/11/2022 FINDINGS:  Transverse diameter of heart is increased. There is increased density in the medial left lower lung field. Small patchy infiltrates are seen in right parahilar region. There are new patchy densities in right lower lung fields. Low position of diaphragms suggests COPD. There is no significant pleural effusion or pneumothorax. IMPRESSION: There are patchy infiltrates in right parahilar region and right lower lung field with interval worsening suggesting possible multifocal pneumonia. Increased opacity in the medial left lower lung field most likely is due to fixed hiatal hernia. Low position of diaphragms suggests COPD. Electronically Signed   By: 04/13/2022 M.D.   On: 04/16/2022 16:46    Scheduled Meds:  amLODipine  10 mg Oral q AM   amoxicillin-clavulanate  1 tablet Oral Q12H   apixaban  5 mg Oral BID   cyanocobalamin  500 mcg Oral q AM   feeding supplement  237 mL Oral BID BM   ferrous sulfate  325 mg Oral QODAY   lisinopril  10 mg Oral q AM   magnesium oxide  400 mg Oral Daily   melatonin  5 mg Oral QHS   pantoprazole  40 mg Oral BID   polyethylene glycol powder  17 g Oral q AM   rosuvastatin  20 mg Oral QHS   senna  8.6 mg Oral q AM   sodium bicarbonate  650 mg Oral TID   thiamine  100 mg Oral q AM   topiramate  25 mg Oral q AM   Continuous Infusions:  dextrose 5 % and 0.9% NaCl 75 mL/hr at 04/18/22 06/17/22     LOS: 7 days   [id number], MD [facility] P1/05/2022, 11:36 AM

## 2022-04-18 NOTE — TOC Progression Note (Signed)
Transition of Care [facility]) - Progression Note    Patient Details  Name: Roberta Mercer MRN: [id number] Date of Birth: Jan 16, 1941  [facility] CM/SW Contact  Pollie Friar, RN Phone Number: 04/18/2022, 3:47 PM  Clinical Narrative:    Per notes palliative care to f/u with pts POA to discuss [place].  TOC following.   Expected Discharge Plan: Long Term Nursing Home Barriers to Discharge: Continued Medical Work up  Expected Discharge Plan and Services In-house Referral: Hospice / Palliative Care                                             Social Determinants of Health (SDOH) Interventions SDOH Screenings   Tobacco Use: Medium Risk (04/12/2022)    Readmission Risk Interventions     No data to display

## 2022-04-18 NOTE — Consult Note (Signed)
WOC Nurse Consult Note: Reason for Consult:stage 3 sacral pressure injury in the setting of poor PO intake and CVA.  Wound type: pressure Pressure Injury POA: Yes Measurement: 2 cm x 2 cm x 0.3 cm round lesion Wound ACZ:YSAY pink nongranulating Drainage (amount, consistency, odor)  minimal serosanguinous  no odor Periwound: intact Dressing procedure/placement/frequency: Cleanse sacral wound with NS and pat dry Apply alginate to wound bed ([facility] # [id number])  cover with silicone foam. Change every three days and PRN soilage. Will not follow at this time.  Please re-consult if needed.  Roberta Ludwig MSN, RN, FNP-BC CWON Wound, Ostomy, Continence Nurse [facility] [phone] Pager [phone]

## 2022-04-18 NOTE — Plan of Care (Signed)
  Problem: Education: Goal: Knowledge of disease or condition will improve Outcome: Not Progressing   Problem: Self-Care: Goal: Ability to participate in self-care as condition permits will improve Outcome: Not Progressing Goal: Ability to communicate needs accurately will improve Outcome: Not Progressing   Problem: Nutrition: Goal: Risk of aspiration will decrease Outcome: Not Progressing Goal: Dietary intake will improve Outcome: Not Progressing   Problem: Education: Goal: Knowledge of General Education information will improve Description: Including pain rating scale, medication(s)/side effects and non-pharmacologic comfort measures Outcome: Not Progressing

## 2022-04-18 NOTE — Plan of Care (Signed)
  Problem: Education: Goal: Knowledge of disease or condition will improve Outcome: Progressing Goal: Knowledge of secondary prevention will improve (MUST DOCUMENT ALL) Outcome: Progressing Goal: Knowledge of patient specific risk factors will improve Elta [place] N/A or DELETE if not current risk factor) Outcome: Progressing   Problem: Self-Care: Goal: Ability to participate in self-care as condition permits will improve Outcome: Progressing Goal: Ability to communicate needs accurately will improve Outcome: Progressing   Problem: Nutrition: Goal: Dietary intake will improve Outcome: Progressing   Problem: Skin Integrity: Goal: Risk for impaired skin integrity will decrease Outcome: Progressing

## 2022-04-18 NOTE — Progress Notes (Signed)
Initial Nutrition Assessment  DOCUMENTATION CODES:  Severe malnutrition in context of chronic illness  INTERVENTION:  Continue current diet as ordered Encourage PO intake, nursing to assist with meal setup  Recommend [place] discussions around artificial feeding with family, if aggressive care desired recommend placement of cortrak tube 1/3 Continue Ensure Enlive po BID, each supplement provides 350 kcal and 20 grams of protein.  NUTRITION DIAGNOSIS:  Severe Malnutrition (in the context of chronic illness) related to poor appetite as evidenced by severe fat depletion, severe muscle depletion.  GOAL:  Patient will meet greater than or equal to 90% of their needs  MONITOR:  PO intake, Supplement acceptance, Skin, I & O's, Labs  REASON FOR ASSESSMENT:  Consult Calorie Count  ASSESSMENT:  Pt with hx of CKD3, HTN, prior CVA, and HLD presented to ED from her facility with AMS. Wheel chair bound at baseline.  Pt resting in bed at the time of visit. RN at bedside repositioning pt.    48-hour calorie count ordered and meal tickets are as follows:  12/31 Dinner: 0% intake 1/1 Breakfast: no ticket left 1/1 Lunch: 0% intake 1/1 Dinner: 0% intake  1/2 Breakfast: 0% 1/2 Lunch: 0%  Total intake: 0 kcal (0% of minimum estimated needs)  0 protein (0% of minimum estimated needs)  Discussed with PMT and MD. If aggressive care is desired, pt needs a cortrak placed tomorrow and likely a PEG. This option is likely undesirable based on pt's age and functional status.    Nutritionally Relevant Medications: Scheduled Meds:  amoxicillin-clavulanate  1 tablet Oral Q12H   cyanocobalamin  500 mcg Oral q AM   feeding supplement  237 mL Oral BID BM   ferrous sulfate  325 mg Oral QODAY   magnesium oxide  400 mg Oral Daily   pantoprazole  40 mg Oral BID   polyethylene glycol powder  17 g Oral q AM   rosuvastatin  20 mg Oral QHS   senna  8.6 mg Oral q AM   sodium bicarbonate  650 mg Oral TID    thiamine  100 mg Oral q AM   Continuous Infusions:  dextrose 5 % and 0.9% NaCl 75 mL/hr at 04/18/22 1529   Labs Reviewed  NUTRITION - FOCUSED PHYSICAL EXAM: Flowsheet Row Most Recent Value  Orbital Region Moderate depletion  Upper Arm Region Severe depletion  Thoracic and Lumbar Region Severe depletion  Buccal Region Moderate depletion  Temple Region Moderate depletion  Clavicle Bone Region Mild depletion  Clavicle and Acromion Bone Region Mild depletion  Scapular Bone Region Severe depletion  Dorsal Hand Moderate depletion  Patellar Region Severe depletion  Anterior Thigh Region Severe depletion  Posterior Calf Region Severe depletion  Edema (RD Assessment) Moderate  [legs]  Hair Reviewed  Eyes Reviewed  Mouth Reviewed  Skin Reviewed  Nails Reviewed    Diet Order:   Diet Order             DIET DYS 3 Room service appropriate? Yes with Assist; Fluid consistency: Thin  Diet effective now                   EDUCATION NEEDS:  Not appropriate for education at this time  Skin:  Skin Assessment: Reviewed RN Assessment (stage 3 coccyx)  Last BM:  1/2 - type 5  Height:  Ht Readings from Last 1 Encounters:  04/12/22 5\' 6"  (1.676 m)    Weight: Wt Readings from Last 1 Encounters:  04/12/22 60 kg  Ideal Body Weight:  59.1 kg  BMI:  Body mass index is 21.35 kg/m.  Estimated Nutritional Needs:  Kcal:  1600-1800 kcal/d Protein:  75-90g/d Fluid:  >/=1.6L/d    Roberta Mercer, RD, LDN Clinical Dietitian RD pager # available in [facility]  After hours/weekend pager # available in [facility]

## 2022-04-19 DIAGNOSIS — E43 Unspecified severe protein-calorie malnutrition: Secondary | ICD-10-CM | POA: Diagnosis not present

## 2022-04-19 DIAGNOSIS — E8809 Other disorders of plasma-protein metabolism, not elsewhere classified: Secondary | ICD-10-CM | POA: Diagnosis not present

## 2022-04-19 DIAGNOSIS — R627 Adult failure to thrive: Secondary | ICD-10-CM

## 2022-04-19 DIAGNOSIS — R1310 Dysphagia, unspecified: Secondary | ICD-10-CM | POA: Diagnosis not present

## 2022-04-19 DIAGNOSIS — I639 Cerebral infarction, unspecified: Secondary | ICD-10-CM | POA: Diagnosis not present

## 2022-04-19 DIAGNOSIS — Z7189 Other specified counseling: Secondary | ICD-10-CM | POA: Diagnosis not present

## 2022-04-19 LAB — BASIC METABOLIC PANEL
Anion gap: 9 (ref 5–15)
BUN: 9 mg/dL (ref 8–23)
CO2: 20 mmol/L — ABNORMAL LOW (ref 22–32)
Calcium: 8.8 mg/dL — ABNORMAL LOW (ref 8.9–10.3)
Chloride: 112 mmol/L — ABNORMAL HIGH (ref 98–111)
Creatinine, Ser: 0.69 mg/dL (ref 0.44–1.00)
GFR, Estimated: >60 mL/min (ref >60)
Glucose, Bld: 99 mg/dL (ref 70–99)
Potassium: 3.4 mmol/L — ABNORMAL LOW (ref 3.5–5.1)
Sodium: 141 mmol/L (ref 135–145)

## 2022-04-19 MED ORDER — LORAZEPAM 1 MG PO TABS
1.0000 mg | ORAL_TABLET | ORAL | Status: DC | PRN
  Filled 2022-04-19: qty 1

## 2022-04-19 MED ORDER — ONDANSETRON HCL 4 MG/2ML IJ SOLN: 4.0000 mg | Freq: Four times a day (QID) | INTRAMUSCULAR | Status: DC | PRN

## 2022-04-19 MED ORDER — ONDANSETRON 4 MG PO TBDP: 4.0000 mg | ORAL_TABLET | Freq: Four times a day (QID) | ORAL | Status: DC | PRN

## 2022-04-19 MED ORDER — LORAZEPAM 1 MG PO TABS: 1.0000 mg | ORAL_TABLET | ORAL | Status: DC | PRN

## 2022-04-19 MED ORDER — BIOTENE DRY MOUTH MT LIQD: 15.0000 mL | OROMUCOSAL | Status: DC | PRN

## 2022-04-19 MED ORDER — LORAZEPAM 2 MG/ML PO CONC
1.0000 mg | ORAL | Status: DC | PRN
  Filled 2022-04-19: qty 0.5

## 2022-04-19 MED ORDER — MORPHINE SULFATE (PF) 2 MG/ML IV SOLN
2.0000 mg | INTRAVENOUS | Status: DC | PRN
  Administered 2022-04-19 – 2022-04-21 (×4): 2 mg via INTRAVENOUS
  Filled 2022-04-19 (×4): qty 1

## 2022-04-19 MED ORDER — GLYCOPYRROLATE 0.2 MG/ML IJ SOLN: 0.2000 mg | INTRAMUSCULAR | Status: DC | PRN

## 2022-04-19 MED ORDER — GLYCOPYRROLATE 1 MG PO TABS: 1.0000 mg | ORAL_TABLET | ORAL | Status: DC | PRN

## 2022-04-19 MED ORDER — MORPHINE SULFATE 10 MG/5ML PO SOLN
5.0000 mg | Freq: Four times a day (QID) | ORAL | Status: DC
  Administered 2022-04-20 (×3): 5 mg via ORAL
  Filled 2022-04-19: qty 4
  Filled 2022-04-19 (×3): qty 5

## 2022-04-19 MED ORDER — HALOPERIDOL LACTATE 2 MG/ML PO CONC: 0.5000 mg | ORAL | Status: DC | PRN

## 2022-04-19 MED ORDER — POTASSIUM CHLORIDE 10 MEQ/100ML IV SOLN
10.0000 meq | INTRAVENOUS | Status: AC
  Administered 2022-04-19 (×4): 10 meq via INTRAVENOUS
  Filled 2022-04-19 (×4): qty 100

## 2022-04-19 MED ORDER — POLYVINYL ALCOHOL 1.4 % OP SOLN: 1.0000 [drp] | Freq: Four times a day (QID) | OPHTHALMIC | Status: DC | PRN

## 2022-04-19 MED ORDER — HALOPERIDOL 0.5 MG PO TABS: 0.5000 mg | ORAL_TABLET | ORAL | Status: DC | PRN

## 2022-04-19 MED ORDER — HALOPERIDOL LACTATE 5 MG/ML IJ SOLN
0.5000 mg | INTRAMUSCULAR | Status: DC | PRN
  Administered 2022-04-21: 0.5 mg via INTRAVENOUS
  Filled 2022-04-19: qty 1

## 2022-04-19 NOTE — Significant Event (Signed)
Rn attempted to give patient meds and breakfast and she cried out no. Rn educated on patient on importance of taking meds and eating but she continued to say no. RN notified hospitalist at med side of occurrence. No new orders at this time

## 2022-04-19 NOTE — Plan of Care (Signed)
  Problem: Education: Goal: Knowledge of disease or condition will improve Outcome: Not Progressing Goal: Knowledge of secondary prevention will improve (MUST DOCUMENT ALL) Outcome: Not Progressing Goal: Knowledge of patient specific risk factors will improve ([place] N/A or DELETE if not current risk factor) Outcome: Not Progressing   Problem: Ischemic Stroke/TIA Tissue Perfusion: Goal: Complications of ischemic stroke/TIA will be minimized Outcome: Not Progressing   Problem: Coping: Goal: Will verbalize positive feelings about self Outcome: Not Progressing Goal: Will identify appropriate support needs Outcome: Not Progressing   Problem: Health Behavior/Discharge Planning: Goal: Ability to manage health-related needs will improve Outcome: Not Progressing Goal: Goals will be collaboratively established with patient/family Outcome: Not Progressing   Problem: Self-Care: Goal: Ability to participate in self-care as condition permits will improve Outcome: Not Progressing Goal: Verbalization of feelings and concerns over difficulty with self-care will improve Outcome: Not Progressing Goal: Ability to communicate needs accurately will improve Outcome: Not Progressing   Problem: Nutrition: Goal: Risk of aspiration will decrease Outcome: Not Progressing Goal: Dietary intake will improve Outcome: Not Progressing   Problem: Education: Goal: Knowledge of General Education information will improve Description: Including pain rating scale, medication(s)/side effects and non-pharmacologic comfort measures Outcome: Not Progressing   Problem: Health Behavior/Discharge Planning: Goal: Ability to manage health-related needs will improve Outcome: Not Progressing   Problem: Clinical Measurements: Goal: Ability to maintain clinical measurements within normal limits will improve Outcome: Not Progressing Goal: Will remain free from infection Outcome: Not Progressing Goal: Diagnostic test  results will improve Outcome: Not Progressing Goal: Respiratory complications will improve Outcome: Not Progressing Goal: Cardiovascular complication will be avoided Outcome: Not Progressing   Problem: Activity: Goal: Risk for activity intolerance will decrease Outcome: Not Progressing   Problem: Nutrition: Goal: Adequate nutrition will be maintained Outcome: Not Progressing   Problem: Coping: Goal: Level of anxiety will decrease Outcome: Not Progressing   Problem: Elimination: Goal: Will not experience complications related to bowel motility Outcome: Not Progressing Goal: Will not experience complications related to urinary retention Outcome: Not Progressing   Problem: Pain Managment: Goal: General experience of comfort will improve Outcome: Not Progressing   Problem: Safety: Goal: Ability to remain free from injury will improve Outcome: Not Progressing   Problem: Skin Integrity: Goal: Risk for impaired skin integrity will decrease Outcome: Not Progressing   

## 2022-04-19 NOTE — Progress Notes (Signed)
Speech Language Pathology Treatment: Dysphagia;Cognitive-Linquistic  Patient Details Name: Roberta Mercer MRN: [id number] DOB: 02/12/1941 Today's Date: 04/19/2022 Time: 0920-0950 SLP Time Calculation (min) (ACUTE ONLY): 30 min  Assessment / Plan / Recommendation Clinical Impression  Pt demonstrates calmer affect today. She tracks me and follows my gestures with a response (greeting, cold, offers of food/drink). RN reports pt refused to be fed her breakfast. SLP cleared tray; placed an ensure, a pudding and an ice cream. When each was presented to pt she shook her head if she didn't want it, but said yes if she did and also directed gaze and made a spoon gesture when looking at ice cream. She also said the word cold and shivered. Otherwise, she was completely unintelligible with jargon. Pt does cough intermittently after sips of ensure and bites of ice cream. Suspect possible aspiration but unable to determine without instrumental testing. Pt would not participate in MBS or FEES and would like refuse any modified textures. Oral intake is already severely limited. Discussed with Amy and general poor prognosis for language and nutritional improvement. Would not advise alternate methods of nutrition. Recommended focusing of pts comfort and allowing her to eat what she likes without trying to force her. She is unable to rationalize and is very frustrated about her inability to control her environment or communicate. Will f/u while admitted.   HPI HPI: Patient is a 82 year old female with past medical history of DVT on Coumadin, hypertension, hyperlipidemia, stroke with right-sided residual weakness, GERD who presents from skilled nursing facility for further evaluation of left-sided weakness.  She was noted to have left-sided weakness, difficulty speaking at nursing facility.  Patient is wheelchair-bound at baseline.  On admission ,she was mildly hypertensive.   CT angio head and neck showed a new focal occlusion  or high-grade stenosis of the left M2 segment, severe chronic microvascular ischemic disease, chronic infarcts, suspicious for acute infarct in the left insular cortex.  MRI pending. EEG impression : mild to moderate diffuse encephalopathy, nonspecific etiology. No seizures or epileptiform discharges were seen throughout the recording." Per MD notes, palliative consult requested, pt DNR with poor quality of life.      SLP Plan  Continue with current plan of care      Recommendations for follow up therapy are one component of a multi-disciplinary discharge planning process, led by the attending physician.  Recommendations may be updated based on patient status, additional functional criteria and insurance authorization.    Recommendations  Diet recommendations: Dysphagia 3 (mechanical soft);Thin liquid Liquids provided via: Straw Medication Administration: Other (Comment) (crushed in liquid) Supervision: Full supervision/cueing for compensatory strategies Compensations: Slow rate;Small sips/bites Postural Changes and/or Swallow Maneuvers: Seated upright 90 degrees                Oral Care Recommendations: Oral care BID Follow Up Recommendations: Skilled nursing-short term rehab (<3 hours/day) Assistance recommended at discharge: Frequent or constant Supervision/Assistance SLP Visit Diagnosis: Aphasia (R47.01) Plan: Continue with current plan of care           Adlyn Fife, Katherene Ponto  04/19/2022, 12:01 PM

## 2022-04-19 NOTE — Progress Notes (Signed)
PROGRESS NOTE  Roberta Mercer  NWG:[id number] DOB: 21-Nov-1940 DOA: 04/11/2022 PCP: Lise Auer, MD   Brief Narrative: Patient is a 82 year old female with past medical history of DVT on Coumadin, hypertension, hyperlipidemia, stroke with right-sided residual weakness, GERD who presents from skilled nursing facility for further evaluation of left-sided weakness.  She was noted to have left-sided weakness, difficulty speaking at nursing facility.  Patient is wheelchair-bound at baseline.  On admission ,she was mildly hypertensive.   CT angio head and neck showed a new focal occlusion or high-grade stenosis of the left M2 segment, severe chronic microvascular ischemic disease, chronic infarcts, suspicious for acute infarct in the left insular cortex.  MRI confirmed stroke.  Neurology consulted and were following.PT/OT recommending long-term care.  She continues to have poor oral intake, calorie count completed.  Palliative care also following because of persistent altered mental status ,poor oral intake.  She might be a candidate  for hospice.  Assessment & Plan:  Principal Problem:   Acute ischemic stroke [facility]) Active Problems:   Mixed hyperlipidemia   Essential hypertension   DVT (deep venous thrombosis) (HCC)   Iron deficiency anemia   Hypoalbuminemia due to protein-calorie malnutrition (HCC)   GERD (gastroesophageal reflux disease)  Acute ischemic stroke: Presented from nursing facility with left-sided weakness, slurred speech.  History of prior stroke with right-sided weakness.  CT imaging suspicious for acute infarct.  MRI showed moderate-sized early subacute infarct in the left MCA distribution without hemorrhage or mass effect.. Neurology was  following. Continue statin.  LDL of 44, A1c of 5.1.  PT/speech/OT evaluation done.  Started on Eliquis  Altered mental status: Most likely from acute metabolic encephalopathy from stroke.  History of dementia.  EEG showed mild to moderate diffuse  encephalopathy, nonspecific allergy, no seizures or epileptiform discharges.  Monitor mentation.  Delirium precautions.  As per her family friend Aletha Halim, she is most of the time confused at baseline.On dsyphagia 3 diet, but she is barely eating.  Started on gentle IV fluids. Her mentation has improved .  She is alert and awake but remains confused.  Does not follow command.  Has very poor oral intake.  Nutritionist completed calorie count, her intake is 0.  Suspicion for aspiration pneumonia: Noted to have cough few days ago.  She is saturating fine on room air.  Not dyspneic.  X-ray showed possible multifocal pneumonia.  Could be aspiration.  Started on Augmentin.  Lungs are clear on auscultation.  Hypertension: Home meds restarted .  On amlodipine, lisinopril  History of DVT: On warfarin at home, now switched to Eliquis  Iron deficient anemia: Continue iron supplementation  Hypokalemia/non-anion gap metabolic disease: Potassium being supplemented.  Continue on bicarb tablet  GERD: Continue Protonix  Debility/deconditioning/goals of care: History of prior stroke.  Wheelchair-bound.  Confused at baseline.Lives at nursing facility.  Now with new stroke.  Very poor quality of life.  We have requested palliative care consultation regarding goals of care discussion.  CODE STATUS is DNR.  Patient has very poor intake.   PT/OT have recommended long-term placement in a facility.TOC following.  Palliative care will discuss with friend Amy today who is the caretaker.She might be a candidate for hospice.  I also talked to Amy today, who understands current situation     Pressure Injury 04/13/22 Coccyx Mid Stage 3 -  Full thickness tissue loss. Subcutaneous fat may be visible but bone, tendon or muscle are NOT exposed. (Active)  04/13/22 2000  Location: Coccyx  Location  Orientation: Mid  Staging: Stage 3 -  Full thickness tissue loss. Subcutaneous fat may be visible but bone, tendon or muscle are  NOT exposed.  Wound Description (Comments):   Present on Admission: Yes  Dressing Type Foam - Lift dressing to assess site every shift 04/17/22 1959    DVT prophylaxis:SCD's Start: 04/12/22 0218 apixaban (ELIQUIS) tablet 5 mg     Code Status: DNR  Family Communication: Called and discussed with friend Amy Hardin Negus on phone on 1/3  Patient status:Inpatient  Patient is from :SNF  Anticipated discharge to:SNF vs residential hospice  Estimated DC date:not sure  Consultants: Neurology, palliative care  Procedures:None  Antimicrobials:  Anti-infectives (From admission, onward)    Start     Dose/Rate Route Frequency Ordered Stop   04/17/22 1230  amoxicillin-clavulanate (AUGMENTIN) 875-125 MG per tablet 1 tablet        1 tablet Oral Every 12 hours 04/17/22 1138 04/22/22 0959   04/16/22 1800  ampicillin-sulbactam (UNASYN) 1.5 g in sodium chloride 0.9 % 100 mL IVPB  Status:  Discontinued        1.5 g 200 mL/hr over 30 Minutes Intravenous Every 6 hours 04/16/22 1652 04/17/22 1138       Subjective: Patient seen and examined at bedside today.  Hemodynamically stable.  Lying in bed like yesterday.  Remains confused, continues to have poor oral intake.  Not in any distress.  Objective: Vitals:   04/17/22 1949 04/18/22 0057 04/18/22 0336 04/18/22 0848  BP: (!) 170/68 (!) 152/70 (!) 148/70 (!) 163/58  Pulse: 85 86 82 86  Resp: 16 16 20 17   Temp: 98.5 F (36.9 C) 97.9 F (36.6 C) 98.5 F (36.9 C) 98.9 F (37.2 C)  TempSrc: Oral Axillary Axillary Axillary  SpO2: 92% 95% 98% 93%  Weight:      Height:        Intake/Output Summary (Last 24 hours) at 04/18/2022 1136 Last data filed at 04/18/2022 0343 Gross per 24 hour  Intake 0 ml  Output 300 ml  Net -300 ml   Filed Weights   04/11/22 1800 04/12/22 1253  Weight: 59.8 kg 60 kg    Examination:  General exam: Overall comfortable, not in distress HEENT: PERRL, right facial droop Respiratory system:  no wheezes or crackles   Cardiovascular system: S1 & S2 heard, RRR.  Gastrointestinal system: Abdomen is nondistended, soft and nontender. Central nervous system: Alert and awake but not oriented, does not follow commands.  Generalized weakness Extremities: No edema, no clubbing ,no cyanosis Skin: No rashes, no ulcers,no icterus    Data Reviewed: I have personally reviewed following labs and imaging studies  CBC: Recent Labs  Lab 04/11/22 1758 04/11/22 1809 04/11/22 2034 04/12/22 0315 04/13/22 0535 04/17/22 0301  WBC 8.3  --   --  8.5 5.0 6.6  NEUTROABS 4.3  --   --   --   --   --   HGB 10.4* 11.6* 10.5* 10.9* 9.7* 9.0*  HCT 35.6* 34.0* 31.0* 35.7* 32.7* 28.6*  MCV 90.1  --   --  88.4 90.8 86.9  PLT 199  --   --  201 134* 992*   Basic Metabolic Panel: Recent Labs  Lab 04/11/22 2030 04/11/22 2034 04/12/22 0315 04/13/22 0435 04/14/22 0242 04/15/22 0311 04/17/22 0301 04/18/22 0557  NA  --    < > 137 140 139 138 140 141  K  --    < > 3.8 3.3* 5.3*  5.2* 4.0 3.4* 3.5  CL  --    < >  107 119* 113* 109 112* 111  CO2  --   --  25 17* 20* 20* 18* 20*  GLUCOSE  --    < > 83 65* 71 91 104* 96  BUN  --    < > 11 9 11 11 10 10   CREATININE  --    < > 0.91 0.73 0.79 0.79 0.73 0.64  CALCIUM  --   --  9.3 7.3* 9.2 9.3 8.4* 8.5*  MG 1.7  --  1.8  --   --   --   --   --   PHOS 2.7  --   --   --   --   --   --   --    < > = values in this interval not displayed.     Recent Results (from the past 240 hour(s))  Urine Culture     Status: Abnormal   Collection Time: 04/11/22  8:16 PM   Specimen: Urine, Catheterized  Result Value Ref Range Status   Specimen Description URINE, CATHETERIZED  Final   Special Requests   Final    NONE Performed at [facility], [address], [place], [place] [address]    Culture MULTIPLE SPECIES PRESENT, SUGGEST RECOLLECTION (A)  Final   Report Status 04/12/2022 FINAL  Final     Radiology Studies: DG CHEST PORT 1 VIEW  Result Date: 04/16/2022 CLINICAL DATA:   Cough EXAM: PORTABLE CHEST 1 VIEW COMPARISON:  Previous studies including the examination of 04/11/2022 FINDINGS: Transverse diameter of heart is increased. There is increased density in the medial left lower lung field. Small patchy infiltrates are seen in right parahilar region. There are new patchy densities in right lower lung fields. Low position of diaphragms suggests COPD. There is no significant pleural effusion or pneumothorax. IMPRESSION: There are patchy infiltrates in right parahilar region and right lower lung field with interval worsening suggesting possible multifocal pneumonia. Increased opacity in the medial left lower lung field most likely is due to fixed hiatal hernia. Low position of diaphragms suggests COPD. Electronically Signed   By: Elmer Picker M.D.   On: 04/16/2022 16:46    Scheduled Meds:  amLODipine  10 mg Oral q AM   amoxicillin-clavulanate  1 tablet Oral Q12H   apixaban  5 mg Oral BID   cyanocobalamin  500 mcg Oral q AM   feeding supplement  237 mL Oral BID BM   ferrous sulfate  325 mg Oral QODAY   lisinopril  10 mg Oral q AM   magnesium oxide  400 mg Oral Daily   melatonin  5 mg Oral QHS   pantoprazole  40 mg Oral BID   polyethylene glycol powder  17 g Oral q AM   rosuvastatin  20 mg Oral QHS   senna  8.6 mg Oral q AM   sodium bicarbonate  650 mg Oral TID   thiamine  100 mg Oral q AM   topiramate  25 mg Oral q AM   Continuous Infusions:  dextrose 5 % and 0.9% NaCl 75 mL/hr at 04/18/22 9794     LOS: 7 days   Shelly Coss, MD [facility] P1/05/2022, 11:36 AM

## 2022-04-19 NOTE — Progress Notes (Signed)
Additional encounter-   Spoke with Roberta Mercer. Roberta Mercer and Roberta Mercer are legal primary decision makers-   See: Roberta Mercer 90. Medicine and Allied Occupations  90-21.13. Informed consent to health care treatment or procedure. In brief summary, this statute outlines that the following persons, in order indicated, are authorized to consent to medical treatment on behalf of the patient who does not demonstrate capacity to do so: Legally assigned guardian appointed by the court > healthcare agent appointed by legal healthcare power of attorney > healthcare agent appointed by the patient > legal spouse > majority of the patient's reasonably available parents and children who are at least 87 years of age > individual who has an established relationship with the patient, who is acting in good faith on behalf of the patient, and who can reliably convey the patient's wishes > attending physician with confirmation by another physician of the patient's condition and the necessity for treatment (unless in urgent/emergent situation)   Given that patient's previous HCPOA revoked his responsibilities, and there are no other family members involved with patient- then surrogate decision makers are Roberta Mercer and Roberta Mercer who are individuals with an established relationship with the patient and are acting in good faith on behalf of the patient, and can reliably convey her wishes.  I reviewed Roberta Mercer's calorie count and that she is not eating or drinking any significant amount of intake to support her nutritionally.  Discussed options for continued aggressive life prolonging medical interventions vs transition to comfort measures only. Discussed transition to comfort measures includes stopping IV fluids, antibiotics, labs and providing symptom management for SOB, anxiety, nausea, vomiting, and other symptoms of dying. Would also provide hand feeding according to patient's wishes, but no artificial nutrition or  hydration. Also discussed hospice philosophy of care and services.   Roberta Mercer has reviewed Roberta Mercer's advance directives which declare her desire for a natural death without artificial prolonging measures in the face of terminal illness. I shared with Roberta Mercer that when a stroke is to the point that a patient stops eating and drinking it is considered a terminal illness.   At close of discussion Roberta Mercer would like to transition to full comfort measures only and request hospice assistance. She is interested in either inpatient hospice or long term care. Roberta Mercer has a trust that is able to pay for long term care. I also discussed option of home with hospice and hiring supplemental care.   I returned to patient's bedside and Roberta Mercer was at bedside and felt that patient was comprehending some of what she was saying. Roberta Mercer also felt that patient was complaining of pain all over. I shared my discussion with  Roberta Mercer with Roberta Mercer and Roberta Mercer. Roberta Mercer responded, "yes" and "right" when I discussed transitioning to comfort and hospice. I am uncertain as to her true comprehension of our discussion but her demeanor did appear calmer after our discussion.   Plan:  -Transition to full comfort measures -Schedule morphine liquid 5mg  q6hr for unverbalized pain -Lorazepam 1mg  sublingual q4hr prn anxiety or for sleep -Morphine 2mg  IV q4hr prn moderate pain or shortness of breath -Robinul .2mg  IV q4hr prn secretions -TOC consult for referral to hospice  Total additional time: 80 minutes

## 2022-04-19 NOTE — Progress Notes (Signed)
Daily Progress Note   Patient Name: Roberta Mercer       Date: 04/19/2022 DOB: 14-Jun-1940  Age: 82 y.o. MRN#: [id number] Attending Physician: Shelly Coss, MD Primary Care Physician: Mateo Flow, MD Admit Date: 04/11/2022  Reason for Consultation/Follow-up: Establishing goals of care  Patient Profile/HPI: Patient is a 82 year old female with past medical history of DVT on Coumadin, hypertension, hyperlipidemia, stroke with right-sided residual weakness, GERD who presents from skilled nursing facility for further evaluation of left-sided weakness.  She was noted to have left-sided weakness, difficulty speaking at nursing facility.  Patient is wheelchair-bound at baseline.  On admission ,she was mildly hypertensive.   CT angio head and neck showed a new focal occlusion or high-grade stenosis of the left M2 segment, severe chronic microvascular ischemic disease, chronic infarcts, suspicious for acute infarct in the left insular cortex.  MRI confirmed stroke.  Neurology consulted and following.Remains confused this mrng. There is suspicion for aspiration pneumonia.  Palliative care has been asked to get involved for further [place] conversations.   Subjective: Chart reviewed including labs, progress notes, imaging from this and previous encounters.  Monasia is awake. She answers "yeah" to all of my questions. She is unable to follow any commands.  She is not taking in any po nutrition or hydration. Noted SLP note and discussion with Amy.  I called Amy and left voicemail requesting return call.  Called Almyra Free- she and Amy are in discussion about goals of care. Amy related SLP conversation to [place]. I reviewed with Almyra Free the need to readdress goals of care and consider comfort measures. Per Sammuel Bailiff has a  copy of patient's Advanced Directives and is reviewing them- I also requested a copy. I have also called patient's lawyer and left a message requesting return call.   Review of Systems  Unable to perform ROS: Mental status change     Physical Exam Vitals and nursing note reviewed.  Constitutional:      Appearance: She is ill-appearing.     Comments: frail  Cardiovascular:     Rate and Rhythm: Normal rate.             Vital Signs: BP (!) 156/104 (BP Location: Right Arm)   Pulse (!) 101   Temp 98 F (36.7 C) (Oral)   Resp 16   Ht  5\' 6"  (1.676 m)   Wt 60 kg   SpO2 98%   BMI 21.35 kg/m  SpO2: SpO2: 98 % O2 Device: O2 Device: Room Air O2 Flow Rate:    Intake/output summary:  Intake/Output Summary (Last 24 hours) at 04/19/2022 1318 Last data filed at 04/19/2022 1300 Gross per 24 hour  Intake 4583.64 ml  Output 350 ml  Net 4233.64 ml    LBM: Last BM Date : 04/19/22 Baseline Weight: Weight: 59.8 kg Most recent weight: Weight: 60 kg       Palliative Assessment/Data: PPS: 10%      Patient Active Problem List   Diagnosis Date Noted   Protein-calorie malnutrition, severe 04/19/2022   Iron deficiency anemia 04/12/2022   Hypoalbuminemia due to protein-calorie malnutrition ([place]) 04/12/2022   GERD (gastroesophageal reflux disease) 04/12/2022   Acute ischemic stroke ([place]) 04/11/2022   Moderate aortic stenosis 01/06/2021   SOB (shortness of breath) 01/06/2021   Moderate aortic regurgitation 01/06/2021   History of CVA (cerebrovascular accident) 05/11/2020   CKD (chronic kidney disease)    DVT (deep venous thrombosis) (HCC)    Stroke (HCC)    TIA (transient ischemic attack)    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   Nonrheumatic aortic valve stenosis 06/05/2019   LVH (left ventricular hypertrophy) 63/78/5885   Diastolic dysfunction 02/77/4128   Stage 3a chronic kidney disease ([place]) 06/05/2019   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    Essential hypertension 03/03/2019   Post-menopausal atrophic vaginitis    Mixed hyperlipidemia    History of venous thromboembolism    Essential tremor    Esophageal spasm    History of stroke 11/14/2014    Palliative Care Assessment & Plan    Assessment/Recommendations/Plan  Continue current care DNR- limited interventions Amy and Almyra Free are in discussion regarding goals of care- they plan to call me later today for more conversation and decisions regarding continued aggressive medical interventions vs transition to comfort measures and hospice  Code Status: DNR  Prognosis:  Unable to determine  Discharge Planning: To Be Determined   Thank you for allowing the Palliative Medicine Team to assist in the care of this patient.   Greater than 50%  of this time was spent counseling and coordinating care related to the above assessment and plan.  Mariana Kaufman, AGNP-C Palliative Medicine   Please contact Palliative Medicine Team phone at [phone] for questions and concerns.

## 2022-04-20 DIAGNOSIS — R627 Adult failure to thrive: Secondary | ICD-10-CM | POA: Diagnosis not present

## 2022-04-20 DIAGNOSIS — K219 Gastro-esophageal reflux disease without esophagitis: Secondary | ICD-10-CM | POA: Diagnosis not present

## 2022-04-20 DIAGNOSIS — R69 Illness, unspecified: Secondary | ICD-10-CM

## 2022-04-20 DIAGNOSIS — I10 Essential (primary) hypertension: Secondary | ICD-10-CM | POA: Diagnosis not present

## 2022-04-20 DIAGNOSIS — E43 Unspecified severe protein-calorie malnutrition: Secondary | ICD-10-CM | POA: Diagnosis not present

## 2022-04-20 DIAGNOSIS — I639 Cerebral infarction, unspecified: Secondary | ICD-10-CM | POA: Diagnosis not present

## 2022-04-20 NOTE — Progress Notes (Signed)
Nutrition Brief Note  Chart reviewed. Pt now transitioning to comfort care.  No further nutrition interventions planned at this time. Continue to allow pt to eat as she desires. Will leave ensure ordered as pt has been taking some sips during the day.   Please re-consult as needed.   Ranell Patrick, RD, LDN Clinical Dietitian RD pager # available in [place]  After hours/weekend pager # available in [facility]

## 2022-04-20 NOTE — Progress Notes (Signed)
Daily Progress Note   Patient Name: Roberta Mercer       Date: 04/20/2022 DOB: Jun 23, 1940  Age: 82 y.o. MRN#: [id number] Attending Physician: Mendel Corning, MD Primary Care Physician: Mateo Flow, MD Admit Date: 04/11/2022  Reason for Consultation/Follow-up: Establishing goals of care  Patient Profile/HPI: Patient is a 82 year old female with past medical history of DVT on Coumadin, hypertension, hyperlipidemia, stroke with right-sided residual weakness, GERD who presents from skilled nursing facility for further evaluation of left-sided weakness.  She was noted to have left-sided weakness, difficulty speaking at nursing facility.  Patient is wheelchair-bound at baseline.  On admission ,she was mildly hypertensive.   CT angio head and neck showed a new focal occlusion or high-grade stenosis of the left M2 segment, severe chronic microvascular ischemic disease, chronic infarcts, suspicious for acute infarct in the left insular cortex.  MRI confirmed stroke.  Neurology consulted and following.Remains confused this mrng. There is suspicion for aspiration pneumonia.  Palliative care has been asked to get involved for further [place] conversations.  1/4- decision made to transition to comfort measures and request Hospice services  Subjective: Chart reviewed including labs, progress notes, imaging from this and previous encounters.  Kemya is sleeping soundly. Appears comfortable.    Review of Systems  Unable to perform ROS: Mental status change     Physical Exam Vitals and nursing note reviewed.  Constitutional:      Appearance: She is ill-appearing.     Comments: frail  Cardiovascular:     Rate and Rhythm: Normal rate.             Vital Signs: BP 134/71 (BP Location: Right Arm)   Pulse 92    Temp 97.7 F (36.5 C) (Oral)   Resp 16   Ht 5\' 6"  (1.676 m)   Wt 60 kg   SpO2 96%   BMI 21.35 kg/m  SpO2: SpO2: 96 % O2 Device: O2 Device: Room Air O2 Flow Rate:    Intake/output summary:  Intake/Output Summary (Last 24 hours) at 04/20/2022 1227 Last data filed at 04/20/2022 1039 Gross per 24 hour  Intake 726.38 ml  Output --  Net 726.38 ml    LBM: Last BM Date : 04/20/21 Baseline Weight: Weight: 59.8 kg Most recent weight: Weight: 60 kg       Palliative  Assessment/Data: PPS: 10%      Patient Active Problem List   Diagnosis Date Noted   Protein-calorie malnutrition, severe 04/19/2022   Iron deficiency anemia 04/12/2022   Hypoalbuminemia due to protein-calorie malnutrition ([place]) 04/12/2022   GERD (gastroesophageal reflux disease) 04/12/2022   Acute ischemic stroke ([place]) 04/11/2022   Moderate aortic stenosis 01/06/2021   SOB (shortness of breath) 01/06/2021   Moderate aortic regurgitation 01/06/2021   History of CVA (cerebrovascular accident) 05/11/2020   CKD (chronic kidney disease)    DVT (deep venous thrombosis) (HCC)    Stroke (HCC)    TIA (transient ischemic attack)    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   Nonrheumatic aortic valve stenosis 06/05/2019   LVH (left ventricular hypertrophy) 57/84/6962   Diastolic dysfunction 95/28/4132   Stage 3a chronic kidney disease ([place]) 06/05/2019   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   Essential hypertension 03/03/2019   Post-menopausal atrophic vaginitis    Mixed hyperlipidemia    History of venous thromboembolism    Essential tremor    Esophageal spasm    History of stroke 11/14/2014    Palliative Care Assessment & Plan    Assessment/Recommendations/Plan  Continue current care DNR- full comfort care Appreciate TOC assistance with referral to hospice and disposition- family open to inpatient hospice facility of patient deemed eligible- they also report that patient's trust fund  can pay privately for long term care with  hospice if needed  Code Status: DNR  Prognosis:  < 4 weeks   Discharge Planning: To Be Determined   Thank you for allowing the Palliative Medicine Team to assist in the care of this patient.  Greater than 50%  of this time was spent counseling and coordinating care related to the above assessment and plan.  Mariana Kaufman, AGNP-C Palliative Medicine   Please contact Palliative Medicine Team phone at [phone] for questions and concerns.

## 2022-04-20 NOTE — Progress Notes (Signed)
[facility]                                                                              Irania Mercer, is a 82 y.o. female, DOB - January 24, 1941, [id number] Admit date - 04/11/2022    Outpatient Primary MD for the patient is Mateo Flow, MD  LOS - 9  days  Chief Complaint  Patient presents with   Code Stroke       Brief summary   Patient is a 82 year old female with past medical history of DVT on Coumadin, hypertension, hyperlipidemia, stroke with right-sided residual weakness, GERD who presents from skilled nursing facility for further evaluation of left-sided weakness.  She was noted to have left-sided weakness, difficulty speaking at nursing facility.  Patient is wheelchair-bound at baseline.  On admission ,she was mildly hypertensive.   CT angio head and neck showed a new focal occlusion or high-grade stenosis of the left M2 segment, severe chronic microvascular ischemic disease, chronic infarcts, suspicious for acute infarct in the left insular cortex.  MRI confirmed stroke.  Neurology consulted and were following.PT/OT recommending long-term care.  She continues to have poor oral intake, calorie count completed.  Palliative care also following because of persistent altered mental status ,poor oral intake.  Currently comfort care status  Assessment & Plan    Principal Problem:   Acute ischemic stroke [facility]) -Presented from SNF with left-sided weakness, slurred speech with history of prior CVA and right-sided weakness. -CT and MRI showed moderate-sized early subacute infarct in the left MCA distribution without hemorrhage or mass effect. -CT angio head and neck showed new focal occlusion or high-grade stenosis of left M2 segment, severe chronic microvascular ischemic disease, chronic infarcts, suspicious for acute infarct in the left insular cortex -2D echo showed EF of 65 to 70%, G1 DD -Neurology was consulted, patient was placed on statin, eliquis. -LDL 44,  hemoglobin A1c 5.1. -PT, OT, ST evaluation done -Palliative medicine consulted, placed on comfort care status.  TOC assisting with referral to hospice  Active problems Acute metabolic encephalopathy superimposed on dementia -Likely from acute CVA.  Has underlying history of dementia -EEG showed mild to moderate diffuse encephalopathy, nonspecific, no seizures or epileptiform discharges -Per family, most of the time has been confused at baseline -Poor p.o. intake, calorie count was completed and intake minimal.  Palliative medicine consulted and per family's wishes, placed on comfort care status   Aspiration pneumonia -Noted to have cough few days prior, no dyspnea.  CXR showed possible multifocal pneumonia, could be aspiration -Patient was placed on Augmentin, currently comfort care status   Hypertension -Currently comfort care status, on amlodipine   History of DVT -Was on warfarin at home, then switched to eliquis, now comfort care status   Iron deficient anemia:  -Comfort care status  GERD -Continue PPI   Debility/deconditioning/goals of care:  -DNR, comfort care status, TOC, palliative medicine assisting with referral to residential hospice  Pressure injury documentation Coccyx mid stage III, POA  Severe protein calorie malnutrition Nutrition Problem: Severe Malnutrition (in the context of chronic illness) Etiology: poor appetite Signs/Symptoms: severe fat depletion, severe muscle depletion Estimated body  mass index is 21.35 kg/m as calculated from the following:   Height as of this encounter: 5\' 6"  (1.676 m).   Weight as of this encounter: 60 kg.  Code Status: DNR/DNI, comfort care DVT Prophylaxis:     Level of Care: Level of care: Telemetry Medical Family Communication:  Disposition Plan:      Remains inpatient appropriate: TOC assisting with referral to residential hospice   Procedures:  2D echo  Consultants:   Palliative medicine,  neurology  Antimicrobials:   Anti-infectives (From admission, onward)    Start     Dose/Rate Route Frequency Ordered Stop   04/17/22 1230  amoxicillin-clavulanate (AUGMENTIN) 875-125 MG per tablet 1 tablet  Status:  Discontinued        1 tablet Oral Every 12 hours 04/17/22 1138 04/19/22 1721   04/16/22 1800  ampicillin-sulbactam (UNASYN) 1.5 g in sodium chloride 0.9 % 100 mL IVPB  Status:  Discontinued        1.5 g 200 mL/hr over 30 Minutes Intravenous Every 6 hours 04/16/22 1652 04/17/22 1138          Medications  amLODipine  10 mg Oral q AM   feeding supplement  237 mL Oral BID BM   melatonin  5 mg Oral QHS   morphine  5 mg Oral Q6H   pantoprazole  40 mg Oral BID   polyethylene glycol powder  17 g Oral q AM   senna  8.6 mg Oral q AM   topiramate  25 mg Oral q AM      Subjective:   Roberta Mercer was seen and examined today.  Confused, opens eyes, not following any commands.  Appears comfortable.  Objective:   Vitals:   04/19/22 1155 04/19/22 1528 04/19/22 2013 04/20/22 0735  BP: (!) 156/104 132/61 (!) 138/124 134/71  Pulse: (!) 101 (!) 102 91 92  Resp: 16 16  16   Temp: 98 F (36.7 C) 97.8 F (36.6 C) 98.4 F (36.9 C) 97.7 F (36.5 C)  TempSrc: Oral Oral Oral Oral  SpO2: 98% 97% 95% 96%  Weight:      Height:        Intake/Output Summary (Last 24 hours) at 04/20/2022 1309 Last data filed at 04/20/2022 1039 Gross per 24 hour  Intake 606.38 ml  Output --  Net 606.38 ml     Wt Readings from Last 3 Encounters:  04/12/22 60 kg  11/17/21 66.2 kg  06/16/21 64 kg     Exam General: Awake, opens eyes, NAD, ill-appearing HEENT: right facial droop Cardiovascular: S1 S2 auscultated,  RRR Respiratory: Clear to auscultation bilaterally, no wheezing Gastrointestinal: Soft, nontender, nondistended, + bowel sounds Ext: no pedal edema bilaterally Neuro: does not follow any commands  Data Reviewed:  I have personally reviewed following labs    CBC Lab Results   Component Value Date   WBC 6.6 04/17/2022   RBC 3.29 (L) 04/17/2022   HGB 9.0 (L) 04/17/2022   HCT 28.6 (L) 04/17/2022   MCV 86.9 04/17/2022   MCH 27.4 04/17/2022   PLT 117 (L) 04/17/2022   MCHC 31.5 04/17/2022   RDW 17.6 (H) 04/17/2022   LYMPHSABS 3.5 04/11/2022   MONOABS 0.4 04/11/2022   EOSABS 0.0 04/11/2022   BASOSABS 0.0 93/79/0240     Last metabolic panel Lab Results  Component Value Date   NA 141 04/19/2022   K 3.4 (L) 04/19/2022   CL 112 (H) 04/19/2022   CO2 20 (L) 04/19/2022   BUN 9 04/19/2022  CREATININE 0.69 04/19/2022   GLUCOSE 99 04/19/2022   GFRNONAA >60 04/19/2022   GFRAA 53 (L) 05/26/2019   CALCIUM 8.8 (L) 04/19/2022   PHOS 2.7 04/11/2022   PROT 6.4 (L) 04/11/2022   ALBUMIN 2.5 (L) 04/11/2022   BILITOT 0.5 04/11/2022   ALKPHOS 66 04/11/2022   AST 29 04/11/2022   ALT 16 04/11/2022   ANIONGAP 9 04/19/2022    Radiology Studies: I have personally reviewed the imaging studies  No results found.     Estill Cotta M.D. [facility] 04/20/2022, 1:09 PM  Available via Epic secure chat 7am-7pm After 7 pm, please refer to night coverage provider listed on amion.

## 2022-04-20 NOTE — TOC Progression Note (Addendum)
Transition of Care [facility]) - Progression Note    Patient Details  Name: Roberta Mercer MRN: [id number] Date of Birth: 09-May-1940  Transition of Care [facility]) CM/SW Contact  Pollie Friar, RN Phone Number: 04/20/2022, 10:37 AM  Clinical Narrative:    [facility] consulted for residential hospice in [facility]. CM has sent referral to [facility] with [place] and gave the referral. They will review and update CM.  TOC following.   Expected Discharge Plan: Long Term Nursing Home Barriers to Discharge: Continued Medical Work up  Expected Discharge Plan and Services In-house Referral: Hospice / Palliative Care                                             Social Determinants of Health (SDOH) Interventions SDOH Screenings   Tobacco Use: Medium Risk (04/12/2022)    Readmission Risk Interventions     No data to display

## 2022-04-21 DIAGNOSIS — I639 Cerebral infarction, unspecified: Secondary | ICD-10-CM | POA: Diagnosis not present

## 2022-04-21 DIAGNOSIS — K219 Gastro-esophageal reflux disease without esophagitis: Secondary | ICD-10-CM | POA: Diagnosis not present

## 2022-04-21 DIAGNOSIS — R69 Illness, unspecified: Secondary | ICD-10-CM | POA: Diagnosis not present

## 2022-04-21 MED ORDER — PANTOPRAZOLE SODIUM 40 MG IV SOLR
40.0000 mg | Freq: Two times a day (BID) | INTRAVENOUS | Status: DC
  Administered 2022-04-21: 40 mg via INTRAVENOUS
  Filled 2022-04-21: qty 10

## 2022-04-21 MED ORDER — POLYVINYL ALCOHOL 1.4 % OP SOLN: 1.0000 [drp] | Freq: Four times a day (QID) | OPHTHALMIC | 0 refills | Status: AC | PRN

## 2022-04-21 MED ORDER — GLYCOPYRROLATE 1 MG PO TABS: 1.0000 mg | ORAL_TABLET | ORAL | Status: AC | PRN

## 2022-04-21 MED ORDER — ONDANSETRON 4 MG PO TBDP: 4.0000 mg | ORAL_TABLET | Freq: Four times a day (QID) | ORAL | 0 refills | Status: AC | PRN

## 2022-04-21 NOTE — TOC Transition Note (Signed)
Transition of Care [facility]) - CM/SW Discharge Note   Patient Details  Name: Roberta Mercer MRN: [id number] Date of Birth: 08/17/1940  Transition of Care [facility] CM/SW Contact:  Pollie Friar, RN Phone Number: 04/21/2022, 3:27 PM   Clinical Narrative:    Pt is discharging to hospice of [facility]. She will transport via [place]. Pts friends updated and aware of transport timing. D/c packet is at the desk and beside RN aware.   Number for report: [phone]    Final next level of care: Hospice Medical Facility Barriers to Discharge: No Barriers Identified   Patient Goals and CMS Choice CMS Medicare.gov Compare Post Acute Care list provided to:: Patient Represenative (must comment) Choice offered to / list presented to :  (friend: Amy)  Discharge Placement                  Patient to be transferred to facility by: [place] Name of family member notified: Amy Patient and family notified of of transfer: 04/21/22  Discharge Plan and Services Additional resources added to the After Visit Summary for   In-house Referral: Hospice / Palliative Care                                   Social Determinants of Health (SDOH) Interventions SDOH Screenings   Tobacco Use: Medium Risk (04/12/2022)     Readmission Risk Interventions     No data to display

## 2022-04-21 NOTE — Progress Notes (Signed)
[facility]                                                                              Roberta Mercer, is a 82 y.o. female, DOB - 17-Jul-1940, [id number] Admit date - 04/11/2022    Outpatient Primary MD for the patient is Mateo Flow, MD  LOS - 10  days  Chief Complaint  Patient presents with   Code Stroke       Brief summary   Patient is a 82 year old female with past medical history of DVT on Coumadin, hypertension, hyperlipidemia, stroke with right-sided residual weakness, GERD who presents from skilled nursing facility for further evaluation of left-sided weakness.  She was noted to have left-sided weakness, difficulty speaking at nursing facility.  Patient is wheelchair-bound at baseline.  On admission ,she was mildly hypertensive.   CT angio head and neck showed a new focal occlusion or high-grade stenosis of the left M2 segment, severe chronic microvascular ischemic disease, chronic infarcts, suspicious for acute infarct in the left insular cortex.  MRI confirmed stroke.  Neurology consulted and were following.PT/OT recommending long-term care.  She continues to have poor oral intake, calorie count completed.  Palliative care also following because of persistent altered mental status ,poor oral intake.  Currently comfort care status  Assessment & Plan    Principal Problem:   Acute ischemic stroke [facility]) -Presented from SNF with left-sided weakness, slurred speech with history of prior CVA and right-sided weakness. -CT and MRI showed moderate-sized early subacute infarct in the left MCA distribution without hemorrhage or mass effect. -CT angio head and neck showed new focal occlusion or high-grade stenosis of left M2 segment, severe chronic microvascular ischemic disease, chronic infarcts, suspicious for acute infarct in the left insular cortex -2D echo showed EF of 65 to 70%, G1 DD -Neurology was consulted, patient was placed on statin, eliquis. -LDL 44,  hemoglobin A1c 5.1. -PT, OT, ST evaluation done -Palliative medicine consulted, placed on comfort care status.   -Currently awaiting residential hospice versus vs SNF with hospice    Active problems Acute metabolic encephalopathy superimposed on dementia -Likely from acute CVA.  Has underlying history of dementia -EEG showed mild to moderate diffuse encephalopathy, nonspecific, no seizures or epileptiform discharges -Per family, most of the time has been confused at baseline -Poor p.o. intake, calorie count was completed and intake minimal.  Palliative medicine consulted and per family's wishes, placed on comfort care status -Continue comfort care goals   Aspiration pneumonia -Noted to have cough few days prior, no dyspnea.  CXR showed possible multifocal pneumonia, could be aspiration -Patient was placed on Augmentin, currently comfort care status   Hypertension -Not taking any p.o. meds, amlodipine discontinued   History of DVT -Was on warfarin at home, then switched to eliquis -Not on any anticoagulation, currently comfort care goals  Iron deficient anemia:  -Comfort care status  GERD -Continue PPI   Debility/deconditioning/goals of care:  -DNR, comfort care status, TOC, palliative medicine assisting with referral to residential hospice  Pressure injury documentation Coccyx mid stage III, POA  Severe protein calorie malnutrition Nutrition Problem: Severe Malnutrition (in the context of  chronic illness) Etiology: poor appetite Signs/Symptoms: severe fat depletion, severe muscle depletion Estimated body mass index is 21.35 kg/m as calculated from the following:   Height as of this encounter: 5\' 6"  (1.676 m).   Weight as of this encounter: 60 kg.  Code Status: DNR/DNI, comfort care DVT Prophylaxis:     Level of Care: Level of care: Telemetry Medical Family Communication:  Disposition Plan:      Remains inpatient appropriate: Awaiting residential hospice versus SNF  with hospice  Procedures:  2D echo  Consultants:   Palliative medicine, neurology  Antimicrobials:   Anti-infectives (From admission, onward)    Start     Dose/Rate Route Frequency Ordered Stop   04/17/22 1230  amoxicillin-clavulanate (AUGMENTIN) 875-125 MG per tablet 1 tablet  Status:  Discontinued        1 tablet Oral Every 12 hours 04/17/22 1138 04/19/22 1721   04/16/22 1800  ampicillin-sulbactam (UNASYN) 1.5 g in sodium chloride 0.9 % 100 mL IVPB  Status:  Discontinued        1.5 g 200 mL/hr over 30 Minutes Intravenous Every 6 hours 04/16/22 1652 04/17/22 1138          Medications  feeding supplement  237 mL Oral BID BM   morphine  5 mg Oral Q6H   pantoprazole (PROTONIX) IV  40 mg Intravenous Q12H   polyethylene glycol powder  17 g Oral q AM   topiramate  25 mg Oral q AM      Subjective:   Roberta Mercer was seen and examined today.  Alert and awake however confused and moaning.    Objective:   Vitals:   04/20/22 0735 04/20/22 1942 04/21/22 0713 04/21/22 1127  BP: 134/71 126/69 (!) 140/49 (!) 99/53  Pulse: 92 97 83 89  Resp: 16 16 18 14   Temp: 97.7 F (36.5 C) (!) 100.4 F (38 C) 99 F (37.2 C) 99 F (37.2 C)  TempSrc: Oral Axillary Oral Oral  SpO2: 96% 97% 97% 93%  Weight:      Height:       No intake or output data in the 24 hours ending 04/21/22 1314    Wt Readings from Last 3 Encounters:  04/12/22 60 kg  11/17/21 66.2 kg  06/16/21 64 kg    Physical Exam General: Alert, awake, appears to be somewhat uncomfortable Cardiovascular: S1 S2 clear, RRR.  Respiratory: CTAB, no wheezing, rales Gastrointestinal: Soft, nontender, nondistended, NBS Ext: no pedal edema bilaterally Neuro: does not follow any commands   Data Reviewed:  I have personally reviewed following labs    CBC Lab Results  Component Value Date   WBC 6.6 04/17/2022   RBC 3.29 (L) 04/17/2022   HGB 9.0 (L) 04/17/2022   HCT 28.6 (L) 04/17/2022   MCV 86.9 04/17/2022   MCH  27.4 04/17/2022   PLT 117 (L) 04/17/2022   MCHC 31.5 04/17/2022   RDW 17.6 (H) 04/17/2022   LYMPHSABS 3.5 04/11/2022   MONOABS 0.4 04/11/2022   EOSABS 0.0 04/11/2022   BASOSABS 0.0 85/46/2703     Last metabolic panel Lab Results  Component Value Date   NA 141 04/19/2022   K 3.4 (L) 04/19/2022   CL 112 (H) 04/19/2022   CO2 20 (L) 04/19/2022   BUN 9 04/19/2022   CREATININE 0.69 04/19/2022   GLUCOSE 99 04/19/2022   GFRNONAA >60 04/19/2022   GFRAA 53 (L) 05/26/2019   CALCIUM 8.8 (L) 04/19/2022   PHOS 2.7 04/11/2022   PROT 6.4 (L)  04/11/2022   ALBUMIN 2.5 (L) 04/11/2022   BILITOT 0.5 04/11/2022   ALKPHOS 66 04/11/2022   AST 29 04/11/2022   ALT 16 04/11/2022   ANIONGAP 9 04/19/2022    Radiology Studies: I have personally reviewed the imaging studies  No results found.     Estill Cotta M.D. [facility] 04/21/2022, 1:14 PM  Available via Epic secure chat 7am-7pm After 7 pm, please refer to night coverage provider listed on amion.

## 2022-04-21 NOTE — Discharge Summary (Signed)
Physician Discharge Summary   Patient: Roberta Mercer MRN: [id number] DOB: 1940/09/10  Admit date:     04/11/2022  Discharge date: 04/21/22  Discharge Physician: Thad Rangeripudeep Kelsha Older, MD    PCP: Lise AuerKhan, Jaber A, MD   Recommendations at discharge:    Comfort care   Discharge Diagnoses:    Acute ischemic stroke [facility](HCC)   Mixed hyperlipidemia   Essential hypertension   History of DVT (deep venous thrombosis) (HCC)   Iron deficiency anemia   Hypoalbuminemia due to protein-calorie malnutrition (HCC)   GERD (gastroesophageal reflux disease)   Protein-calorie malnutrition, severe    Hospital Course:  Patient is a 82 year old female with past medical history of DVT on Coumadin, hypertension, hyperlipidemia, stroke with right-sided residual weakness, GERD who presents from skilled nursing facility for further evaluation of left-sided weakness.  She was noted to have left-sided weakness, difficulty speaking at nursing facility.  Patient is wheelchair-bound at baseline.  On admission ,she was mildly hypertensive.   CT angio head and neck showed a new focal occlusion or high-grade stenosis of the left M2 segment, severe chronic microvascular ischemic disease, chronic infarcts, suspicious for acute infarct in the left insular cortex.  MRI confirmed stroke.  Neurology consulted and were following.PT/OT recommending long-term care.  She continues to have poor oral intake, calorie count completed.  Palliative care also following because of persistent altered mental status ,poor oral intake.  Currently comfort care status   Assessment and Plan:   Acute ischemic stroke [facility](HCC) -Presented from SNF with left-sided weakness, slurred speech with history of prior CVA and right-sided weakness. -CT and MRI showed moderate-sized early subacute infarct in the left MCA distribution without hemorrhage or mass effect. -CT angio head and neck showed new focal occlusion or high-grade stenosis of left M2 segment, severe  chronic microvascular ischemic disease, chronic infarcts, suspicious for acute infarct in the left insular cortex -2D echo showed EF of 65 to 70%, G1 DD -Neurology was consulted, patient was placed on statin, eliquis. -LDL 44, hemoglobin A1c 5.1. -PT, OT, ST evaluation done -Palliative medicine consulted, placed on comfort care status.    Acute metabolic encephalopathy superimposed on dementia -Likely from acute CVA.  Has underlying history of dementia -EEG showed mild to moderate diffuse encephalopathy, nonspecific, no seizures or epileptiform discharges -Per family, most of the time has been confused at baseline -Poor p.o. intake, calorie count was completed and intake minimal.  Palliative medicine consulted and per family's wishes, placed on comfort care status -Continue comfort care goals   Aspiration pneumonia -Noted to have cough few days prior, no dyspnea.  CXR showed possible multifocal pneumonia, could be aspiration -Patient was placed on Augmentin, currently comfort care status   Hypertension -Not taking any p.o. meds, amlodipine discontinued   History of DVT -Was on warfarin at home, then switched to eliquis -Not on any anticoagulation, currently comfort care goals   Iron deficient anemia:  -Comfort care status   GERD -Continue PPI   Debility/deconditioning/goals of care:  -DNR, comfort care status   Pressure injury documentation Coccyx mid stage III, POA   Severe protein calorie malnutrition Nutrition Problem: Severe Malnutrition (in the context of chronic illness) Etiology: poor appetite Signs/Symptoms: severe fat depletion, severe muscle depletion Estimated body mass index is 21.35 kg/m as calculated from the following:   Height as of this encounter: 5\' 6"  (1.676 m).   Weight as of this encounter: 60 kg.       Pain control - [employer] [place] Controlled Substance Reporting System database  was reviewed. and patient was instructed, not to drive, operate  heavy machinery, perform activities at heights, swimming or participation in water activities or provide baby-sitting services while on Pain, Sleep and Anxiety Medications; until their outpatient Physician has advised to do so again. Also recommended to not to take more than prescribed Pain, Sleep and Anxiety Medications.  Consultants: neurology, Palliative medicine  Procedures performed:  Disposition: Hospice care Diet recommendation: DYSPHAGIA 3, THIN LIQUIDS   DISCHARGE MEDICATION: Allergies as of 04/21/2022       Reactions   Bactrim [sulfamethoxazole-trimethoprim] Other (See Comments)   Unknown reaction   Diovan [valsartan] Other (See Comments)   Unknown reaction   Sulfa Antibiotics Other (See Comments)   Listed on MAR Unknown reaction        Medication List     STOP taking these medications    amLODipine 10 MG tablet Commonly known as: NORVASC   COLLAGEN EX   folic acid 400 MCG tablet Commonly known as: FOLVITE   lisinopril 5 MG tablet Commonly known as: ZESTRIL   Magnesium 400 MG Tabs   melatonin 5 MG Tabs   Nutritional Drink Liqd   OMEGA-3 FISH OIL PO   ProSource Liqd   rosuvastatin 20 MG tablet Commonly known as: CRESTOR   saccharomyces boulardii 250 MG capsule Commonly known as: FLORASTOR   senna 8.6 MG Tabs tablet Commonly known as: SENOKOT   thiamine 100 MG tablet Commonly known as: Vitamin B-1   traMADol 50 MG tablet Commonly known as: ULTRAM   vitamin B-12 500 MCG tablet Commonly known as: CYANOCOBALAMIN   VITAMIN D-3 PO   warfarin 3 MG tablet Commonly known as: COUMADIN       TAKE these medications    acetaminophen 325 MG tablet Commonly known as: TYLENOL Take 650 mg by mouth every 6 (six) hours as needed (pain).   bisacodyl 10 MG suppository Commonly known as: DULCOLAX Place 10 mg rectally daily as needed (no BM in 24 hours).   glycopyrrolate 1 MG tablet Commonly known as: ROBINUL Take 1 tablet (1 mg total) by mouth  every 4 (four) hours as needed (excessive secretions).   ondansetron 4 MG disintegrating tablet Commonly known as: ZOFRAN-ODT Take 1 tablet (4 mg total) by mouth every 6 (six) hours as needed for nausea.   pantoprazole 40 MG tablet Commonly known as: PROTONIX Take 40 mg by mouth 2 (two) times daily. (0800, 2000)   polyethylene glycol powder 17 GM/SCOOP powder Commonly known as: GLYCOLAX/MIRALAX Take 17 g by mouth in the morning. (0800)   polyvinyl alcohol 1.4 % ophthalmic solution Commonly known as: LIQUIFILM TEARS Place 1 drop into both eyes 4 (four) times daily as needed for dry eyes.   topiramate 25 MG tablet Commonly known as: TOPAMAX Take 25 mg by mouth in the morning. (0800)               Discharge Care Instructions  (From admission, onward)           Start     Ordered   04/21/22 0000  Discharge wound care:       Comments: Cleanse sacral wound with NS and pat dry Apply alginate to wound bed, cover with silicone foam. Change every three days and PRN soilage   04/21/22 1509            Follow-up Information     Micki RileySethi, Pramod S, MD. Go on 05/11/2022.   Specialties: Neurology, Radiology Why: stroke clinic, As needed Contact information: [phone]  [place] [address] [phone]                Discharge Exam: Danley Danker Weights   04/11/22 1800 04/12/22 1253  Weight: 59.8 kg 60 kg   S: No acute issues, confused   BP (!) 99/53 (BP Location: Right Arm)   Pulse 89   Temp 99 F (37.2 C) (Oral)   Resp 14   Ht 5\' 6"  (1.676 m)   Wt 60 kg   SpO2 93%   BMI 21.35 kg/m   Physical Exam General: Alert and  awake, confused Cardiovascular: S1 S2 clear, RRR.  Respiratory: CTAB, no wheezing Gastrointestinal: Soft, nontender, nondistended, NBS Ext: no pedal edema bilaterally Neuro: does not follow commands     Condition at discharge: poor  The results of significant diagnostics from this hospitalization (including imaging,  microbiology, ancillary and laboratory) are listed below for reference.   Imaging Studies: DG CHEST PORT 1 VIEW  Result Date: 04/16/2022 CLINICAL DATA:  Cough EXAM: PORTABLE CHEST 1 VIEW COMPARISON:  Previous studies including the examination of 04/11/2022 FINDINGS: Transverse diameter of heart is increased. There is increased density in the medial left lower lung field. Small patchy infiltrates are seen in right parahilar region. There are new patchy densities in right lower lung fields. Low position of diaphragms suggests COPD. There is no significant pleural effusion or pneumothorax. IMPRESSION: There are patchy infiltrates in right parahilar region and right lower lung field with interval worsening suggesting possible multifocal pneumonia. Increased opacity in the medial left lower lung field most likely is due to fixed hiatal hernia. Low position of diaphragms suggests COPD. Electronically Signed   By: Elmer Picker M.D.   On: 04/16/2022 16:46   MR BRAIN WO CONTRAST  Result Date: 04/13/2022 CLINICAL DATA:  History of prior stroke with right-sided weakness, presenting with left-sided weakness. EXAM: MRI HEAD WITHOUT CONTRAST TECHNIQUE: Multiplanar, multiecho pulse sequences of the brain and surrounding structures were obtained without intravenous contrast. COMPARISON:  CT/CTA head and neck 2 days prior FINDINGS: Brain: There is moderate-sized area of diffusion restriction with associated T2/FLAIR signal abnormality involving the posterior aspect of the left insula extending to the temporal and parietal lobe consistent with early subacute infarct. There is no associated hemorrhage or mass effect. SWI signal dropout within a vessel in this region likely reflects thrombus (7-54). There is no acute intracranial hemorrhage or extra-axial fluid collection. Background parenchymal volume is within expected limits for age. The ventricles are normal in size. There is mild background chronic small vessel  ischemic change. There are small remote infarcts in the left occipital cortex, bilateral basal ganglia, and bilateral cerebellar hemispheres. There are a few scattered punctate chronic microhemorrhages, nonspecific. There is no mass lesion.  There is no mass effect or midline shift. Vascular: The major flow voids are preserved. Suspected thrombus within a left MCA branch as above. Skull and upper cervical spine: Normal marrow signal. Sinuses/Orbits: The paranasal sinuses are clear. Bilateral lens implants are in place. The globes and orbits are otherwise unremarkable. Other: None. IMPRESSION: Moderate-sized early subacute infarct in the left MCA distribution without hemorrhage or mass effect. Electronically Signed   By: Valetta Mole M.D.   On: 04/13/2022 10:50   ECHOCARDIOGRAM LIMITED  Result Date: 04/12/2022    ECHOCARDIOGRAM LIMITED REPORT   Patient Name:   TENNELLE TAFLINGER Date of Exam: 04/12/2022 Medical Rec #:  [id number]     Height:       66.0 in Accession #:  [id number]    Weight:       131.8 lb Date of Birth:  [birth year]     BSA:          1.675 m Patient Age:    81 years      BP:           128/67 mmHg Patient Gender: F             HR:           79 bpm. Exam Location:  Inpatient Procedure: Limited Echo, Cardiac Doppler and Limited Color Doppler Indications:    Stroke I63.9  History:        Patient has prior history of Echocardiogram examinations, most                 recent 01/05/2022. LVH, Stroke, Aortic Valve Disease; Risk                 Factors:Hypertension, Dyslipidemia and Former Smoker.  Sonographer:    Aron Baba Referring Phys: [id number] OLADAPO ADEFESO  Sonographer Comments: Image acquisition challenging due to patient body habitus. IMPRESSIONS  1. Left ventricular ejection fraction, by estimation, is 65 to 70%. The left ventricle has normal function. The left ventricle has no regional wall motion abnormalities. There is severe concentric left ventricular hypertrophy. Left ventricular diastolic   parameters are consistent with Grade I diastolic dysfunction (impaired relaxation).  2. Right ventricular systolic function is normal. The right ventricular size is normal.  3. A small pericardial effusion is present. The pericardial effusion is circumferential. There is no evidence of cardiac tamponade.  4. The mitral valve is grossly normal. Trivial mitral valve regurgitation.  5. Aortic valve regurgitation is mild to moderate. Comparison(s): Limited echo. Compared to prior, there is no significant change based on images obtained. EF remains normal. Continues to have mild-to-moderate AR. FINDINGS  Left Ventricle: Left ventricular ejection fraction, by estimation, is 65 to 70%. The left ventricle has normal function. The left ventricle has no regional wall motion abnormalities. The left ventricular internal cavity size was normal in size. There is  severe concentric left ventricular hypertrophy. Left ventricular diastolic parameters are consistent with Grade I diastolic dysfunction (impaired relaxation). Right Ventricle: The right ventricular size is normal. No increase in right ventricular wall thickness. Right ventricular systolic function is normal. Pericardium: A small pericardial effusion is present. The pericardial effusion is circumferential. There is no evidence of cardiac tamponade. Mitral Valve: The mitral valve is grossly normal. There is mild thickening of the mitral valve leaflet(s). Trivial mitral valve regurgitation. Tricuspid Valve: The tricuspid valve is normal in structure. Tricuspid valve regurgitation is mild. Aortic Valve: Aortic valve regurgitation is mild to moderate. Additional Comments: Spectral Doppler performed. Color Doppler performed.  LEFT VENTRICLE PLAX 2D LVIDd:         3.60 cm LVIDs:         2.30 cm LV PW:         1.90 cm LV IVS:        1.20 cm  LEFT ATRIUM         Index LA diam:    3.50 cm 2.09 cm/m  MITRAL VALVE               TRICUSPID VALVE MV Area (PHT): 2.22 cm    TR Peak  grad:   25.4 mmHg MV Decel Time: 342 msec    TR Vmax:        252.00 cm/s MV E velocity: 67.30  cm/s MV A velocity: 88.10 cm/s MV E/A ratio:  0.76 Laurance Flatten MD Electronically signed by Laurance Flatten MD Signature Date/Time: 04/12/2022/12:07:57 PM    Final    EEG adult  Result Date: 04/12/2022 Charlsie Quest, MD     04/12/2022  8:28 AM Patient Name: Kynadi Dragos MRN: [id number] Epilepsy Attending: Charlsie Quest Referring Physician/Provider: Marjorie Smolder, NP Date: 04/11/2022 Duration: 21.52 mins Patient history:  82 year old patient with history of stroke with residual right-sided weakness, hypertension, hyperlipidemia, DVT on Coumadin and CKD presented from skilled nursing facility with aphasia and initial report of left-sided weakness, and later possibly change to right-sided weakness.  EEG to evaluate for seizure. Level of alertness: Awake AEDs during EEG study: None Technical aspects: This EEG study was done with scalp electrodes positioned according to the 10-20 International system of electrode placement. Electrical activity was reviewed with band pass filter of 1-70Hz , sensitivity of 7 uV/mm, display speed of 3mm/sec with a 60Hz  notched filter applied as appropriate. EEG data were recorded continuously and digitally stored.  Video monitoring was available and reviewed as appropriate. Description: No clear posterior dominant rhythm was seen.  EEG showed continuous generalized predominantly 5 to 9 Hz theta-alpha activity admixed with intermittent generalized 2 to 3 Hz delta slowing. Hyperventilation and photic stimulation were not performed.   ABNORMALITY - Continuous slow, generalized IMPRESSION: This study is suggestive of mild to moderate diffuse encephalopathy, nonspecific etiology. No seizures or epileptiform discharges were seen throughout the recording.   DG Chest Port 1 View  Result Date: 04/11/2022 CLINICAL DATA:  Altered mental status EXAM: PORTABLE  CHEST 1 VIEW COMPARISON:  03/22/2022 FINDINGS: Lungs are symmetrically well expanded. 12 mm rim calcified density within the right mid lung zone appears new from prior examination and may be artifactual in nature, related to a confluence of osseous and vascular shadows, given its relatively rapid development. No pneumothorax or pleural effusion. Cardiac size within normal limits. Retrocardiac density again noted likely reflecting the previously identified hiatal hernia. Pulmonary vascularity is normal. No acute bone abnormality. IMPRESSION: 1. No active disease. 2. Moderate hiatal hernia. 3. 12 mm rim calcified density within the right mid lung zone, favored to be artifactual. This could be confirmed with a standard two view chest radiograph once the patient's acute issues have resolved. Electronically Signed   By: 14/09/2021 M.D.   On: 04/11/2022 20:54   CT HEAD CODE STROKE WO CONTRAST  Result Date: 04/11/2022 CLINICAL DATA:  Code stroke.  Left sided weakness. EXAM: CT ANGIOGRAPHY HEAD AND NECK TECHNIQUE: Multidetector CT imaging of the head and neck was performed using the standard protocol during bolus administration of intravenous contrast. Multiplanar CT image reconstructions and MIPs were obtained to evaluate the vascular anatomy. Carotid stenosis measurements (when applicable) are obtained utilizing NASCET criteria, using the distal internal carotid diameter as the denominator. RADIATION DOSE REDUCTION: This exam was performed according to the departmental dose-optimization program which includes automated exposure control, adjustment of the mA and/or kV according to patient size and/or use of iterative reconstruction technique. CONTRAST:  35mL OMNIPAQUE IOHEXOL 350 MG/ML SOLN COMPARISON:  CT Head 03/22/22, CTA head/neck 01/09/22 FINDINGS: CT HEAD FINDINGS Brain: Possible new region of poor gray white differentiation in the left insular cortex (series 3, image 18). Sequela of severe chronic  microvascular ischemic disease. There are chronic infarcts in the bilateral caudate heads, left lentiform nucleus, bilateral cerebellar hemispheres, in the corona radiata on the left. Compared to prior  exam there appears to be new age indeterminate infarct in the left thalamus. No hemorrhage. No hydrocephalus. No extra-axial fluid collection. No hyperdense vessel. Vascular: No hyperdense vessel or unexpected calcification. Skull: Normal. Negative for fracture or focal lesion. Sinuses/Orbits: Bilateral lens replacement. Paranasal sinuses and mastoid air cells are clear. Other: None CTA NECK FINDINGS Aortic arch: Standard branching. Imaged portion shows no evidence of aneurysm or dissection. No significant stenosis of the major arch vessel origins. Right carotid system: Calcified atherosclerotic plaque at the carotid bifurcation. Mild narrowing of the carotid bifurcation. Left carotid system: Calcified atherosclerotic plaque at the bifurcation. Mild narrowing of the carotid bifurcation. Vertebral arteries: Right dominant system. Left vertebral artery predominantly terminates in a PICA Skeleton: Negative. Other neck: Negative. Upper chest: Moderate emphysema. Review of the MIP images confirms the above findings CTA HEAD FINDINGS Anterior circulation: There is a new focal occlusion 2 high-grade stenosis of a left M2 segment (series 8, image 96). There is persistent moderate narrowing of the M1 segment of the right MCA without evidence of occlusion. There is mild narrowing of the A2 segment of the right ACA. Posterior circulation: No significant stenosis, proximal occlusion, aneurysm, or vascular malformation. Venous sinuses: As permitted by contrast timing, patent. Anatomic variants: None Review of the MIP images confirms the above findings IMPRESSION: 1. New focal occlusion or high-grade stenosis of left M2 segments. 2. Possible regions of loss of gray white differentiation in the left inulsar cortex, which could  represent sites of acute infarct. 3. Unchanged moderate narrowing of the M1 segment of the right MCA. 4. 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 Chronic infarcts in the bilateral caudate heads, left lentiform nucleus, bilateral cerebellar hemispheres, and in the corona radiata on the left. Findings were paged to Dr. Wilford Corner on 04/11/22 at 6:34 PM via [facility] paging system. Electronically Signed   By: Lorenza Cambridge M.D.   On: 04/11/2022 18:35   CT ANGIO HEAD NECK W WO CM (CODE STROKE)  Result Date: 04/11/2022 CLINICAL DATA:  Code stroke.  Left sided weakness. EXAM: CT ANGIOGRAPHY HEAD AND NECK TECHNIQUE: Multidetector CT imaging of the head and neck was performed using the standard protocol during bolus administration of intravenous contrast. Multiplanar CT image reconstructions and MIPs were obtained to evaluate the vascular anatomy. Carotid stenosis measurements (when applicable) are obtained utilizing NASCET criteria, using the distal internal carotid diameter as the denominator. RADIATION DOSE REDUCTION: This exam was performed according to the departmental dose-optimization program which includes automated exposure control, adjustment of the mA and/or kV according to patient size and/or use of iterative reconstruction technique. CONTRAST:  15mL OMNIPAQUE IOHEXOL 350 MG/ML SOLN COMPARISON:  CT Head 03/22/22, CTA head/neck 01/09/22 FINDINGS: CT HEAD FINDINGS Brain: Possible new region of poor gray white differentiation in the left insular cortex (series 3, image 18). 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 There are chronic infarcts in the bilateral caudate heads, left lentiform nucleus, bilateral cerebellar hemispheres, in the corona radiata on the left. Compared to prior exam there appears to be new age indeterminate infarct in the left thalamus. No hemorrhage. No hydrocephalus. No extra-axial fluid collection. No hyperdense vessel. Vascular: No hyperdense vessel or unexpected  calcification. Skull: Normal. Negative for fracture or focal lesion. Sinuses/Orbits: Bilateral lens replacement. Paranasal sinuses and mastoid air cells are clear. Other: None CTA NECK FINDINGS Aortic arch: Standard branching. Imaged portion shows no evidence of aneurysm or dissection. No significant stenosis of the major arch vessel origins. Right carotid system: Calcified atherosclerotic plaque at  the carotid bifurcation. Mild narrowing of the carotid bifurcation. Left carotid system: Calcified atherosclerotic plaque at the bifurcation. Mild narrowing of the carotid bifurcation. Vertebral arteries: Right dominant system. Left vertebral artery predominantly terminates in a PICA Skeleton: Negative. Other neck: Negative. Upper chest: Moderate emphysema. Review of the MIP images confirms the above findings CTA HEAD FINDINGS Anterior circulation: There is a new focal occlusion 2 high-grade stenosis of a left M2 segment (series 8, image 96). There is persistent moderate narrowing of the M1 segment of the right MCA without evidence of occlusion. There is mild narrowing of the A2 segment of the right ACA. Posterior circulation: No significant stenosis, proximal occlusion, aneurysm, or vascular malformation. Venous sinuses: As permitted by contrast timing, patent. Anatomic variants: None Review of the MIP images confirms the above findings IMPRESSION: 1. New focal occlusion or high-grade stenosis of left M2 segments. 2. Possible regions of loss of gray white differentiation in the left inulsar cortex, which could represent sites of acute infarct. 3. Unchanged moderate narrowing of the M1 segment of the right MCA. 4. 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 Chronic infarcts in the bilateral caudate heads, left lentiform nucleus, bilateral cerebellar hemispheres, and in the corona radiata on the left. Findings were paged to Dr. Rory Percy on 04/11/22 at 6:34 PM via [facility] paging system. Electronically Signed   By:  Marin Roberts M.D.   On: 04/11/2022 18:35    Microbiology: Results for orders placed or performed during the hospital encounter of 04/11/22  Urine Culture     Status: Abnormal   Collection Time: 04/11/22  8:16 PM   Specimen: Urine, Catheterized  Result Value Ref Range Status   Specimen Description URINE, CATHETERIZED  Final   Special Requests   Final    NONE Performed at [facility], [address] [place], [place] [address]    Culture MULTIPLE SPECIES PRESENT, SUGGEST RECOLLECTION (A)  Final   Report Status 04/12/2022 FINAL  Final    Labs: CBC: Recent Labs  Lab 04/17/22 0301  WBC 6.6  HGB 9.0*  HCT 28.6*  MCV 86.9  PLT 185*   Basic Metabolic Panel: Recent Labs  Lab 04/15/22 0311 04/17/22 0301 04/18/22 0557 04/19/22 0331  NA 138 140 141 141  K 4.0 3.4* 3.5 3.4*  CL 109 112* 111 112*  CO2 20* 18* 20* 20*  GLUCOSE 91 104* 96 99  BUN 11 10 10 9   CREATININE 0.79 0.73 0.64 0.69  CALCIUM 9.3 8.4* 8.5* 8.8*   Liver Function Tests: No results for input(s): "AST", "ALT", "ALKPHOS", "BILITOT", "PROT", "ALBUMIN" in the last 168 hours. CBG: No results for input(s): "GLUCAP" in the last 168 hours.  Discharge time spent: greater than 30 minutes.  Signed: Estill Cotta, MD [facility] 04/21/2022

## 2022-04-21 NOTE — Progress Notes (Addendum)
   Referral was made by [facility] for [facility] house. This pt was reviewed and discussed with our MD and approved for the services at our facility.  I have followed up with the Durable POA Dike at law. I also spoke to Norman at this office who is an other attorney and working with the friends Amy and Almyra Free to put in place an emergent guardianship in place. She is hopeful this will be done by early next week since pt does not have a legal HCPOA in place and no known family.  I was not able to get in touch with friends Almyra Free or Amy yesterday but have been successful in that this am. After discussion with Amy she reports that when there was discussion about options of returning to SNF or going to the [facility] they felt that the pt had a lucid moment and shook her head yes to [facility] and No to the [facility]. Amy is hopeful that the pt will be able to return to [facility] but would like for [place] to not take her off our list at [place] in event this is not able to be accomplished.   I have updated Kelli with TOC and will continue to follow and assist with d/c plan.   300pm Met with pt's friend Amy and Almyra Free to complete paperwork for the [facility] house. Dr. Ander Purpura was updated on the pt and the pt friends did accept bed offer. We can accept the pt today. TOC updated and they will work with MD on d/c and arrange ambulance to the [facility].   Number for the nurse to call report is [place] RN [phone]

## 2022-04-21 NOTE — Progress Notes (Signed)
Patient discharging to [facility]. Called report and spoke to Education officer, museum due to no Nurses available. No questions at this time.

## 2022-05-11 ENCOUNTER — Institutional Professional Consult (permissible substitution): Payer: Medicare Other | Admitting: Neurology

## 2022-05-18 DEATH — deceased

## 2022-06-23 IMAGING — CT CT ANGIO CHEST
1 of 3 series · 1 of 17 positions shown · IV contrast (omnipaque)
Comparison: No priors.

CLINICAL DATA: 80-year-old female with history of severe aortic
stenosis. Preprocedural study prior to potential transcatheter
aortic valve replacement (TAVR) procedure.

EXAM:
CT ANGIOGRAPHY CHEST, ABDOMEN AND PELVIS
TECHNIQUE: Multidetector CT imaging through the chest, abdomen and pelvis was
performed using the standard protocol during bolus administration of
intravenous contrast. Multiplanar reconstructed images and MIPs were
obtained and reviewed to evaluate the vascular anatomy.
CONTRAST:  95mL OMNIPAQUE IOHEXOL 350 MG/ML SOLN

[Series 3198: — · 0.36mm/px · 1 of 6 slices shown]
[im 4/6]
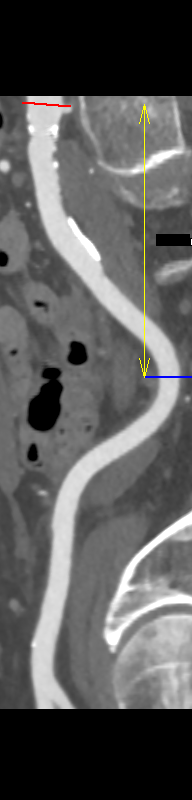

[1 of 17 positions shown; findings below may reference images not displayed]

FINDINGS: CTA CHEST FINDINGS

Cardiovascular: Heart size is borderline enlarged. There is no
significant pericardial fluid, thickening or pericardial
calcification. There is aortic atherosclerosis, as well as
atherosclerosis of the great vessels of the mediastinum and the
coronary arteries, including calcified atherosclerotic plaque in the
left main, left anterior descending and right coronary arteries.
Severe thickening calcification of the aortic valve.

Mediastinum/Lymph Nodes: No pathologically enlarged mediastinal or
hilar lymph nodes. Large hiatal hernia. No axillary lymphadenopathy.

Lungs/Pleura: Patchy areas of peribronchovascular ground-glass
attenuation (some of which are slightly nodular in appearance) are
noted throughout the right upper and middle lobes, and to a lesser
extent in the periphery of the right lower lobe, concerning for
potential multilobar bronchopneumonia. No pleural effusions

Musculoskeletal/Soft Tissues: There are no aggressive appearing
lytic or blastic lesions noted in the visualized portions of the
skeleton.

CTA ABDOMEN AND PELVIS FINDINGS

Hepatobiliary: No suspicious cystic or solid hepatic lesions. No
intra or extrahepatic biliary ductal dilatation. Multiple partially
calcified gallstones lying dependently in the gallbladder.
Gallbladder is moderately distended. Gallbladder wall thickness is
normal. No pericholecystic fluid or surrounding inflammatory
changes.

Pancreas: No pancreatic mass. No pancreatic ductal dilatation. No
pancreatic or peripancreatic fluid collections or inflammatory
changes.

Spleen: Unremarkable.

Adrenals/Urinary Tract: Low-attenuation lesions in both kidneys,
compatible with simple cysts, largest of which is exophytic in the
periphery of the lower pole of the right kidney (axial image 135 of
series 4) measuring 1.4 x 0.9 cm. Other subcentimeter
low-attenuation lesions in both kidneys, too small to definitively
characterize, but statistically likely to represent tiny cysts. No
hydroureteronephrosis. Urinary bladder is normal in appearance.
Bilateral adrenal glands are normal in appearance.

Stomach/Bowel: Normal appearance of the intra-abdominal portion of
the stomach. No pathologic dilatation of small bowel or colon.
Numerous colonic diverticulae are noted, without definite
surrounding inflammatory changes to suggest an acute diverticulitis
at this time. Colonic wall thickening throughout the mid sigmoid
colon. The appendix is not confidently identified and may be
surgically absent. Regardless, there are no inflammatory changes
noted adjacent to the cecum to suggest the presence of an acute
appendicitis at this time.

Vascular/Lymphatic: Aortic atherosclerosis, with vascular findings
and measurements pertinent to potential TAVR procedure, as detailed
below. No aneurysm or dissection noted in the abdominal or pelvic
vasculature. No lymphadenopathy noted in the abdomen or pelvis.

Reproductive: Uterus and ovaries are unremarkable in appearance.

Other: No significant volume of ascites.  No pneumoperitoneum.

Musculoskeletal: There are no aggressive appearing lytic or blastic
lesions noted in the visualized portions of the skeleton.

VASCULAR MEASUREMENTS PERTINENT TO TAVR:

AORTA:

Minimal Aortic 2iameter-Q.E x 1.1 mm

Severity of Aortic Calcification-severe

RIGHT PELVIS:

Right Common Iliac Artery -

Minimal Wiameter-W.N x 6.6 mm

Tortuosity-mild

Calcification-moderate

Right External Iliac Artery -

Minimal Hiameter-W.F x 7.0 mm

Tortuosity-moderate to severe

Calcification-none

Right Common Femoral Artery -

Minimal Kiameter-Q.Q x 7.1 mm

Tortuosity-mild

Calcification-mild

LEFT PELVIS:

Left Common Iliac Artery -

Minimal 2iameter-E.N x 7.1 mm

Tortuosity-mild

Calcification-mild-to-moderate

Left External Iliac Artery -

Minimal Yiameter-8.Y x 6.2 mm

Tortuosity-moderate to severe

Calcification-none

Left Common Femoral Artery -

Minimal 6iameter-Q.E x 5.7 mm

Tortuosity-mild

Calcification-mild

Review of the MIP images confirms the above findings.
IMPRESSION: 1. Vascular findings and measurements pertinent to potential TAVR
procedure, as detailed above.
2. Severe thickening and calcification of the aortic valve,
compatible with reported clinical history of severe aortic stenosis.
3. Patchy areas of peribronchovascular ground-glass attenuation
scattered throughout the right lung, concerning for multilobar
right-sided bronchopneumonia. Some of these are slightly nodular in
appearance. The possibility of multicentric neoplasm such as primary
bronchogenic adenocarcinoma is not excluded, but not strongly
favored. Short-term follow-up noncontrast chest CT is recommended in
3 months to ensure the resolution of these findings.
4. Thickening of the colonic wall throughout the mid sigmoid colon.
This could be related to chronic diverticular disease. There are no
surrounding acute inflammatory changes to clearly indicate an acute
diverticulitis or colitis at this time. The possibility of an
infiltrative colonic neoplasm in this region is not excluded, but
not strongly favored. Correlation with colonoscopy could be
considered if not recently obtained.
5. Aortic atherosclerosis, in addition to left main and 2 vessel
coronary artery disease.
6. Cholelithiasis without evidence of acute cholecystitis at this
time.
7. Additional incidental findings, as above.
